# Patient Record
Sex: Male | Born: 1965 | Race: White | Hispanic: Yes | Marital: Married | Smoking: Never smoker
Health system: Southern US, Community
[De-identification: ages and names within clinical notes are randomized; demographics above are authoritative.]

## PROBLEM LIST (undated history)

## (undated) ENCOUNTER — Emergency Department (HOSPITAL_COMMUNITY): Payer: No Typology Code available for payment source | Source: Home / Self Care

## (undated) DIAGNOSIS — I10 Essential (primary) hypertension: Secondary | ICD-10-CM

## (undated) DIAGNOSIS — E119 Type 2 diabetes mellitus without complications: Secondary | ICD-10-CM

## (undated) NOTE — *Deleted (*Deleted)
[facility] NOTE   MRN : [id number]  Reason for Consult: Non healing left foot Referring Physician: Dr.Pashayan  History of Present Illness: 42 y/o male left foot ulcer and cellulitis.  He has started antibiotics on 10/23/21for MSSA bacteremia.    DM with polyneuropathy, PVD, and HTN.       Current Facility-Administered Medications  Medication Dose Route Frequency Provider Last Rate Last Admin  . 0.9 %  sodium chloride infusion  250 mL Intravenous PRN Briant Cedar, MD      . acetaminophen (TYLENOL) tablet 650 mg  650 mg Oral Q6H PRN Briant Cedar, MD   650 mg at 04/14/20 2044   Or  . acetaminophen (TYLENOL) suppository 650 mg  650 mg Rectal Q6H PRN Briant Cedar, MD      . amLODipine (NORVASC) tablet 10 mg  10 mg Oral Daily Benay Pike, MD   10 mg at 04/15/20 [id number]  . aspirin chewable tablet 81 mg  81 mg Oral Daily McDiarmid, Blane Ohara, MD   81 mg at 04/15/20 1222  . [START ON 04/16/2020] atorvastatin (LIPITOR) tablet 40 mg  40 mg Oral Daily McDiarmid, Blane Ohara, MD      . baclofen (LIORESAL) tablet 5 mg  5 mg Oral TID PRN Darrelyn Hillock N, DO   5 mg at 04/14/20 1116  . ceFAZolin (ANCEF) IVPB 2g/100 mL premix  2 g Intravenous Q8H Briant Cedar, MD 200 mL/hr at 04/15/20 0838 2 g at 04/15/20 0838  . enoxaparin (LOVENOX) injection 40 mg  40 mg Subcutaneous Q24H Briant Cedar, MD   40 mg at 04/14/20 1737  . ferrous sulfate tablet 325 mg  325 mg Oral Daily Ganta, Anupa, DO   325 mg at 04/15/20 0837  . [START ON 04/16/2020] glipiZIDE (GLUCOTROL XL) 24 hr tablet 5 mg  5 mg Oral Q breakfast McDiarmid, Blane Ohara, MD      . insulin aspart (novoLOG) injection 0-5 Units  0-5 Units Subcutaneous QHS Pashayan, Redgie Grayer, MD      . insulin aspart (novoLOG) injection 0-9 Units  0-9 Units Subcutaneous TID WC Briant Cedar, MD   3 Units at 04/15/20 1222  . polyethylene glycol (MIRALAX / GLYCOLAX) packet 17 g  17 g Oral  Daily PRN Briant Cedar, MD      . sodium chloride flush (NS) 0.9 % injection 3 mL  3 mL Intravenous Q12H Briant Cedar, MD   3 mL at 04/14/20 1117  . sodium chloride flush (NS) 0.9 % injection 3 mL  3 mL Intravenous PRN Briant Cedar, MD   3 mL at 04/13/20 2057  . vitamin B-12 (CYANOCOBALAMIN) tablet 1,000 mcg  1,000 mcg Oral Daily Ganta, Anupa, DO   1,000 mcg at 04/15/20 0837    Pt meds include: Statin :No Betablocker: Yes ASA: Yes Other anticoagulants/antiplatelets: None  Past Medical History:  Diagnosis Date  . Cellulitis of foot, left 04/13/2020  . Diabetes mellitus without complication ([place])   . Hypertension   . Traumatic rupture of plantar fascia of left foot 04/14/2020    History reviewed. No pertinent surgical history.  Social History Social History   Tobacco Use  . Smoking status: Never Smoker  . Smokeless tobacco: Never Used  Substance Use Topics  . Alcohol use: Yes    Comment: occasionally  . Drug use: No    Family History History reviewed. No pertinent family history.***  No Known Allergies  REVIEW OF SYSTEMS  General: [ ]  Weight loss, [ ]  Fever, [ ]  chills Neurologic: [ ]  Dizziness, [ ]  Blackouts, [ ]  Seizure [ ]  Stroke, [ ]  "Mini stroke", [ ]  Slurred speech, [ ]  Temporary blindness; [ ]  weakness in arms or legs, [ ]  Hoarseness [ ]  Dysphagia Cardiac: [ ]  Chest pain/pressure, [ ]  Shortness of breath at rest [ ]  Shortness of breath with exertion, [ ]  Atrial fibrillation or irregular heartbeat  Vascular: [ ]  Pain in legs with walking, [ ]  Pain in legs at rest, [ ]  Pain in legs at night,  [x ] Non-healing ulcer, [ ]  Blood clot in vein/DVT,   Pulmonary: [ ]  Home oxygen, [ ]  Productive cough, [ ]  Coughing up blood, [ ]  Asthma,  [ ]  Wheezing [ ]  COPD Musculoskeletal:  [ ]  Arthritis, [ ]  Low back pain, [ ]  Joint pain Hematologic: [ ]  Easy Bruising, [ ]  Anemia; [ ]  Hepatitis Gastrointestinal: [ ]  Blood in stool, [ ]  Gastroesophageal  Reflux/heartburn, Urinary: [ ]  chronic Kidney disease, [ ]  on HD - [ ]  MWF or [ ]  TTHS, [ ]  Burning with urination, [ ]  Difficulty urinating Skin: [ ]  Rashes, [x ] Wounds Psychological: [ ]  Anxiety, [ ]  Depression  Physical Examination Vitals:   04/15/20 0436 04/15/20 0800 04/15/20 0823 04/15/20 1140  BP: (!) 142/77  (!) 150/81 (!) 143/79  Pulse: 88  89 88  Resp:  18 18 18   Temp: 98.4 F (36.9 C)  99.1 F (37.3 C) 99 F (37.2 C)  TempSrc: Oral  Oral Oral  SpO2: 99% 96% 95% 100%  Weight:      Height:       Body mass index is 40.24 kg/m.  General:  WDWN in NAD Gait: Normal HENT: WNL Eyes: Pupils equal Pulmonary: normal non-labored breathing , without Rales, rhonchi,  wheezing Cardiac: RRR, without  Murmurs, rubs or gallops; No carotid bruits Abdomen: soft, NT, no masses Skin: no rashes, ulcers noted;  no Gangrene , no cellulitis; no open wounds;   Vascular Exam/Pulses:***   Musculoskeletal: no muscle wasting or atrophy; no edema  Neurologic: A&O X 3; Appropriate Affect ;  SENSATION: normal; MOTOR FUNCTION: 5/5 Symmetric Speech is fluent/normal   Significant Diagnostic Studies: CBC Lab Results  Component Value Date   WBC 18.2 (H) 04/15/2020   HGB 7.9 (L) 04/15/2020   HCT 23.7 (L) 04/15/2020   MCV 88.4 04/15/2020   PLT 555 (H) 04/15/2020    BMET    Component Value Date/Time   NA 136 04/14/2020 0624   K 3.9 04/14/2020 0624   CL 106 04/14/2020 0624   CO2 22 04/14/2020 0624   GLUCOSE 107 (H) 04/14/2020 0624   BUN 16 04/14/2020 0624   CREATININE 0.95 04/14/2020 0624   CALCIUM 8.3 (L) 04/14/2020 0624   GFRNONAA >60 04/14/2020 0624   GFRAA  09/21/2009 0020    >60        The eGFR has been calculated using the MDRD equation. This calculation has not been validated in all clinical situations. eGFR's persistently <60 mL/min signify possible Chronic Kidney Disease.   Estimated Creatinine Clearance: 90.3 mL/min (by C-G formula based on SCr of 0.95 mg/dL).   COAG No results found for: INR, PROTIME   Non-Invasive Vascular Imaging:  Review of the MRI scan shows edema over the lateral foot there is some superficial reactive edema in the bone but no evidence of chronic osteomyelitis.  No purulent abscess.    ABI  Findings:  +---------+------------------+-----+---------+--------+  Right  Rt Pressure (mmHg)IndexWaveform Comment   +---------+------------------+-----+---------+--------+  Brachial 168           triphasic      +---------+------------------+-----+---------+--------+  PTA   255        1.52 triphasic      +---------+------------------+-----+---------+--------+  DP    255        1.52 triphasic      +---------+------------------+-----+---------+--------+  Great Toe101        0.60            +---------+------------------+-----+---------+--------+   +---------+------------------+-----+---------+-------+  Left   Lt Pressure (mmHg)IndexWaveform Comment  +---------+------------------+-----+---------+-------+  Brachial 167           triphasic      +---------+------------------+-----+---------+-------+  PTA   255        1.52 triphasic      +---------+------------------+-----+---------+-------+  DP    255        1.52 triphasic      +---------+------------------+-----+---------+-------+  Great Toe52        0.31            +---------+------------------+-----+---------+-------+   +-------+-----------+-----------+------------+------------+  ABI/TBIToday's ABIToday's TBIPrevious ABIPrevious TBI  +-------+-----------+-----------+------------+------------+  Right 1.52    0.60                  +-------+-----------+-----------+------------+------------+  Left  1.52    0.31                   +-------+-----------+-----------+------------+------------+       Summary:  Right: Resting right ankle-brachial index indicates noncompressible right  lower extremity arteries.   Left: Resting left ankle-brachial index indicates noncompressible left  lower extremity arteries.      +-----+---------------+---------+-----------+----------+--------------+  RIGHTCompressibilityPhasicitySpontaneityPropertiesThrombus Aging  +-----+---------------+---------+-----------+----------+--------------+  CFV Full      Yes   Yes                  +-----+---------------+---------+-----------+----------+--------------+         +---------+---------------+---------+-----------+----------+--------------+   LEFT   CompressibilityPhasicitySpontaneityPropertiesThrombus  Aging  +---------+---------------+---------+-----------+----------+--------------+   CFV   Full      Yes   Yes                    +---------+---------------+---------+-----------+----------+--------------+   SFJ   Full                                 +---------+---------------+---------+-----------+----------+--------------+   FV Prox Full                                 +---------+---------------+---------+-----------+----------+--------------+   FV Mid  Full                                 +---------+---------------+---------+-----------+----------+--------------+   FV DistalFull                                 +---------+---------------+---------+-----------+----------+--------------+   PFV   Full                                 +---------+---------------+---------+-----------+----------+--------------+   POP   Full      Yes   Yes                     +---------+---------------+---------+-----------+----------+--------------+  PTV   Full                                 +---------+---------------+---------+-----------+----------+--------------+   PERO                           Not  visualized  +---------+---------------+---------+-----------+----------+--------------+     Summary:  RIGHT:  - No evidence of common femoral vein obstruction.    LEFT:  - There is no evidence of deep vein thrombosis in the lower extremity.     ASSESSMENT/PLAN: ***   Roxy Horseman 04/15/2020 2:55 PM

---

## 1997-12-10 ENCOUNTER — Emergency Department (HOSPITAL_COMMUNITY): Admission: EM | Admit: 1997-12-10 | Discharge: 1997-12-10 | Payer: Self-pay | Admitting: Emergency Medicine

## 2003-07-10 ENCOUNTER — Encounter: Admission: RE | Admit: 2003-07-10 | Discharge: 2003-10-08 | Payer: Self-pay | Admitting: Family Medicine

## 2006-05-25 ENCOUNTER — Inpatient Hospital Stay (HOSPITAL_COMMUNITY): Admission: EM | Admit: 2006-05-25 | Discharge: 2006-05-27 | Payer: Self-pay | Admitting: Emergency Medicine

## 2008-04-30 ENCOUNTER — Emergency Department (HOSPITAL_COMMUNITY): Admission: EM | Admit: 2008-04-30 | Discharge: 2008-04-30 | Payer: Self-pay | Admitting: Emergency Medicine

## 2009-09-20 ENCOUNTER — Emergency Department (HOSPITAL_COMMUNITY)
Admission: EM | Admit: 2009-09-20 | Discharge: 2009-09-21 | Payer: Self-pay | Source: Home / Self Care | Admitting: Emergency Medicine

## 2010-09-10 LAB — DIFFERENTIAL
Basophils Relative: 0 % (ref 0–1)
Eosinophils Relative: 1 % (ref 0–5)
Neutro Abs: 10.1 10*3/uL — ABNORMAL HIGH (ref 1.7–7.7)
Neutrophils Relative %: 73 % (ref 43–77)

## 2010-09-10 LAB — CBC
HCT: 43.1 % (ref 39.0–52.0)
MCHC: 34.6 g/dL (ref 30.0–36.0)
MCV: 89 fL (ref 78.0–100.0)
Platelets: 148 10*3/uL — ABNORMAL LOW (ref 150–400)
RDW: 12.1 % (ref 11.5–15.5)

## 2010-09-10 LAB — URINALYSIS, ROUTINE W REFLEX MICROSCOPIC
Bilirubin Urine: NEGATIVE
Hgb urine dipstick: NEGATIVE
Nitrite: NEGATIVE
pH: 7 (ref 5.0–8.0)

## 2010-09-10 LAB — COMPREHENSIVE METABOLIC PANEL
ALT: 37 U/L (ref 0–53)
AST: 27 U/L (ref 0–37)
Alkaline Phosphatase: 74 U/L (ref 39–117)
BUN: 9 mg/dL (ref 6–23)
Calcium: 8.8 mg/dL (ref 8.4–10.5)
Chloride: 107 mEq/L (ref 96–112)
GFR calc Af Amer: >60 mL/min (ref >60)
Potassium: 3.4 mEq/L — ABNORMAL LOW (ref 3.5–5.1)
Sodium: 138 mEq/L (ref 135–145)
Total Bilirubin: 0.7 mg/dL (ref 0.3–1.2)

## 2010-11-07 NOTE — Discharge Summary (Signed)
Keith Garrett, Keith Garrett NO.:  [id number]   MEDICAL RECORD NO.:  [id number]          PATIENT TYPE:  INP   LOCATION:  2022                         FACILITY:  [facility]   PHYSICIAN:  Hind I Elsaid, MD      DATE OF BIRTH:  1966-03-25   DATE OF ADMISSION:  05/25/2006  DATE OF DISCHARGE:  05/27/2006                               DISCHARGE SUMMARY   PRIMARY CARE PHYSICIAN:  Jama Flavors. Redmond Pulling, M.D.   DISCHARGE DIAGNOSES:  1. Community-acquired pneumonia.  2. Uncontrolled diabetes mellitus with hemoglobin A1c 9.7.  3. Hyperlipidemia.  4. Obesity with metabolic syndrome.   DISCHARGE MEDICATIONS:  1. Avelox 400 mg p.o. daily for 10 days.  2. Glipizide 10 mg p.o. daily.  3. Metformin 500 mg 3 times a day.  4. Lovastatin 400 mg p.o. daily.  5. Aspirin 325 mg p.o. daily.  6. Albuterol nebulizations q.6h. p.r.n.   PROCEDURE DURING HOSPITALIZATION:  Chest x-ray was done during  admission, which revealed a segment of left lower lobe air-space disease  most consistent with pneumonia, lower lung volume with right  interstitial prominence primarily on the right; this is likely due to  chronic bronchitis or smoking.   CONSULTATIONS:  None.   HOSPITAL COURSE:  1. [name] is a 45 year old man, who is Spanish.  He was admitted      with cough and shortness of breath and found to have high white      blood cells, and a chest x-ray with above findings, diagnosed as a      community-acquired pneumonia.  The patient was started on Zithromax      and Rocephin.  A blood culture was obtained which was negative.      Urine for Staphylococcal pneumonia was positive and for Legionella      antigen was negative.  The patient was placed on oxygen.  White      blood cells on admission were 30,000, and on discharge white blood      cells significantly came down to 13.  The patient did not require      any oxygen during hospitalization.  We think patient can be      discharged today with Avelox  400 mg p.o. for 10 days and follow up      with primary care on Monday for further evaluation.  2. Uncontrolled diabetes mellitus.  The patient admitted his      noncompliance with the medication and with the diet, and will      prescribe the patient metformin 500 mg p.o. t.i.d. and will stop      Avandia for the risk of CHF and      congestion.  Continue with glipizide 10 mg  p.o. daily and will      increase metformin to 500 mg b.i.d.  The patient to follow up with      primary care for further adjustment for hemoglobin A1c and for the      diabetes.  3. Dyslipidemia.  The patient to continue Lovastatin and aspirin.      Hind Franco Collet, MD  Electronically Signed     HIE/MEDQ  D:  05/27/2006  T:  05/28/2006  Job:  [id number]

## 2010-11-07 NOTE — H&P (Signed)
Keith Garrett NO.:  [id number]   MEDICAL RECORD NO.:  [id number]          PATIENT TYPE:  EMS   LOCATION:  [facility]                         FACILITY:  [facility]   PHYSICIAN:  Mobolaji B. Bakare, M.D.DATE OF BIRTH:  24-Nov-1965   DATE OF ADMISSION:  05/25/2006  DATE OF DISCHARGE:                              HISTORY & PHYSICAL   PRIMARY CARE PHYSICIAN:  Jama Flavors. Redmond Pulling, M.D.   CHIEF COMPLAINT:  Cough and fever.   HISTORY OF PRESENT ILLNESS:  Keith Garrett is a 45 year old Hispanic  gentleman who was well until 3 days ago while working last Sunday in the  afternoon, he developed chills and fever.  He went home, his wife  checked his temperature and it was 102.  The patient did not use  anything at home.  He subsequently developed cough and shortness of  breath.  Cough is productive of yellowish sputum and it is green in  color.  There is a question of blood streak in the sputum.  There is  associated pleuritic chest pain.  There is no diaphoresis.  The patient  continued to have increased work of breathing  and he was not feeling  better.  He became progressively weak and he has lost his appetite.  Hence, he came to the emergency room.   On initial evaluation, his temperature was 101.8, blood pressure 126/78,  pulse 127, respiratory rate 22, O2 saturations of 91% on room air.  The  patient had a chest x-ray which showed left lower lobe superior segment  air space disease consistent with pneumonia.  He has a leukocytosis of  30,000.  He has received IV Zithromax and Rocephin in the emergency  room.  Blood cultures have been drawn.  He is going to be admitted for  further evaluation.   REVIEW OF SYSTEMS:  He denies abdominal pain, diarrhea, or constipation.  He did have nausea and vomiting daily for the last 3 days.  There is no  dysuria, urgency, or increased frequency of micturition.  He denies  headaches.  Rest of review of systems as in the HPI.   PAST MEDICAL  HISTORY:  1. Diabetes mellitus.  He is on multiple oral hypoglycemic agents.  2. Hyperlipidemia.  3. Obesity.   PAST SURGICAL HISTORY:  None.   MEDICATIONS:  1. Avandia unknown dose.  2. Glipizide 10 mg daily.  3. Metformin 500 mg b.i.d.  4. Lovastatin unknown dose.   ALLERGIES:  No known drug allergies.   FAMILY HISTORY:  Both parents are deceased.  Father died at the age of  43 from heart attack, mother passed away in her [id number] from complications  of diabetes mellitus.  She passed away in 2006/02/21.  The patient's  other siblings are well and alive.   SOCIAL HISTORY:  He works in Engineer, [employer].  He does not smoke  cigarettes currently.  He smoked for 2 years approximately 10 years ago.  He does not drink alcohol.   PHYSICAL EXAMINATION:  VITAL SIGNS:  As mentioned in the HPI.  GENERAL:  The patient is comfortable, slightly tachypneic.  No use of  accessory respiratory muscles.  HEENT:  He is not pale, anicteric.  Mucous membranes moist, no oral  thrush.  NECK:  No elevated JVD.  He has a short neck.  No palpable  lymphadenopathy.  LUNGS:  There is rhonchi breathing in the left lung base and  posteriorly.  HEART:  S1 and S2 tachycardia.  ABDOMEN:  Obese, soft, and nontender.  Bowel sounds present.  No  palpable organomegaly.  No holosystolic murmur.  EXTREMITIES:  No pedal edema, calf tenderness.  Dorsalis pedis pulses  palpable bilaterally.  NEUROLOGY:  No focal neurologic deficit.   INITIAL LABORATORY DATA:  White count 30,400, hemoglobin 14.9,  hematocrit 42.8, platelets 259.  Differential shows left shift.  Urinalysis; specific gravity 1.023, negative for nitrite and esterase,  microscopic is unremarkable.  Strep throat negative.  Sodium 129,  potassium 3.5, chloride 100, BUN 10, creatinine 0.7, glucose 254, bicarb  23.   Chest x-ray shows superior segment left lower lobe air space disease  most consistent with pneumonia.  I recommended radiologic  follow-up  __________ , low lung volumes with mild interstitial prominence  primarily on the right.  This is likely chronic bronchitis of smoking.  Attention to follow-up recommended.   EKG shows sinus tachycardia with a heart rate of 122.   ASSESSMENT/PLAN:  Keith Garrett is a 45 year old Hispanic male presenting  with 3-day history of cough, fever, chills, shortness of breath,  leukocytosis of 30,000, and chest x-ray compatible with left lower lobe  superior segment pneumonia.   1. Community acquired pneumonia with early sepsis.  We will admit him      to stepdown unit.  Continue ceftriaxone 1 gram IV q.12 hours,      Zithromax 500 mg IV daily.  Send sputum for culture and gram stain.      Nebulize with Xopenex 0.6 mg q.3 hours p.r.n.  IV fluids, normal      saline, at 125 mL per hour.  Check lactic acid level, urine      Legionella antigen, urine pneumococcal antigen, blood cultures have      been drawn.  2. Sinus tachycardia secondary to #1.  3. Hyponatremia secondary to nausea and vomiting and hyperglycemia.      We will give IV fluid normal saline as above.  4. Diabetes mellitus uncontrolled.  Holding Metformin secondary to      early sepsis and Avandia in view of recent      literature.  We will start on Lantus 20 units subcutaneous daily      and meal coverage with NovoLog.  Continue Glipizide 10 mg daily.      Check hemoglobin A1C.  5. Hyperlipidemia.  We will continue Lovastatin when dose is      available.  Check fasting lipid profile.      Mobolaji B. Maia Petties, M.D.  Electronically Signed     MBB/MEDQ  D:  05/25/2006  T:  05/25/2006  Job:  [id number]   cc:   Jama Flavors. Redmond Pulling, M.D.

## 2014-02-13 ENCOUNTER — Encounter (HOSPITAL_COMMUNITY): Payer: Self-pay | Admitting: Emergency Medicine

## 2014-02-13 ENCOUNTER — Emergency Department (HOSPITAL_COMMUNITY): Payer: No Typology Code available for payment source

## 2014-02-13 ENCOUNTER — Emergency Department (HOSPITAL_COMMUNITY)
Admission: EM | Admit: 2014-02-13 | Discharge: 2014-02-13 | Disposition: A | Discharge status: Home / Self Care | Payer: No Typology Code available for payment source | Attending: Emergency Medicine | Admitting: Emergency Medicine

## 2014-02-13 DIAGNOSIS — S199XXA Unspecified injury of neck, initial encounter: Principal | ICD-10-CM

## 2014-02-13 DIAGNOSIS — R739 Hyperglycemia, unspecified: Secondary | ICD-10-CM

## 2014-02-13 DIAGNOSIS — S0993XA Unspecified injury of face, initial encounter: Secondary | ICD-10-CM | POA: Diagnosis not present

## 2014-02-13 DIAGNOSIS — Z79899 Other long term (current) drug therapy: Secondary | ICD-10-CM | POA: Insufficient documentation

## 2014-02-13 DIAGNOSIS — R918 Other nonspecific abnormal finding of lung field: Secondary | ICD-10-CM | POA: Diagnosis not present

## 2014-02-13 DIAGNOSIS — R911 Solitary pulmonary nodule: Secondary | ICD-10-CM | POA: Diagnosis not present

## 2014-02-13 DIAGNOSIS — Y9389 Activity, other specified: Secondary | ICD-10-CM | POA: Diagnosis not present

## 2014-02-13 DIAGNOSIS — E119 Type 2 diabetes mellitus without complications: Secondary | ICD-10-CM | POA: Insufficient documentation

## 2014-02-13 DIAGNOSIS — E278 Other specified disorders of adrenal gland: Secondary | ICD-10-CM

## 2014-02-13 DIAGNOSIS — S3981XA Other specified injuries of abdomen, initial encounter: Secondary | ICD-10-CM | POA: Diagnosis not present

## 2014-02-13 DIAGNOSIS — Z7982 Long term (current) use of aspirin: Secondary | ICD-10-CM | POA: Diagnosis not present

## 2014-02-13 DIAGNOSIS — Y92410 Unspecified street and highway as the place of occurrence of the external cause: Secondary | ICD-10-CM | POA: Diagnosis not present

## 2014-02-13 DIAGNOSIS — S298XXA Other specified injuries of thorax, initial encounter: Secondary | ICD-10-CM | POA: Insufficient documentation

## 2014-02-13 DIAGNOSIS — IMO0002 Reserved for concepts with insufficient information to code with codable children: Secondary | ICD-10-CM | POA: Insufficient documentation

## 2014-02-13 DIAGNOSIS — T148XXA Other injury of unspecified body region, initial encounter: Secondary | ICD-10-CM

## 2014-02-13 DIAGNOSIS — I10 Essential (primary) hypertension: Secondary | ICD-10-CM | POA: Insufficient documentation

## 2014-02-13 DIAGNOSIS — E279 Disorder of adrenal gland, unspecified: Secondary | ICD-10-CM

## 2014-02-13 HISTORY — DX: Essential (primary) hypertension: I10

## 2014-02-13 HISTORY — DX: Type 2 diabetes mellitus without complications: E11.9

## 2014-02-13 LAB — I-STAT CHEM 8, ED
BUN: 10 mg/dL (ref 6–23)
CALCIUM ION: 1.18 mmol/L (ref 1.12–1.23)
Chloride: 97 mEq/L (ref 96–112)
Creatinine, Ser: 0.5 mg/dL (ref 0.50–1.35)
Glucose, Bld: 331 mg/dL — ABNORMAL HIGH (ref 70–99)
HEMATOCRIT: 50 % (ref 39.0–52.0)
Hemoglobin: 17 g/dL (ref 13.0–17.0)
POTASSIUM: 4.3 meq/L (ref 3.7–5.3)
Sodium: 131 mEq/L — ABNORMAL LOW (ref 137–147)
TCO2: 22 mmol/L (ref 0–100)

## 2014-02-13 MED ORDER — NAPROXEN 500 MG PO TABS: 500.0000 mg | ORAL_TABLET | Freq: Two times a day (BID) | ORAL | Status: DC

## 2014-02-13 MED ORDER — HYDROCODONE-ACETAMINOPHEN 5-325 MG PO TABS: 1.0000 | ORAL_TABLET | ORAL | Status: DC | PRN

## 2014-02-13 MED ORDER — IOHEXOL 300 MG/ML  SOLN
100.0000 mL | Freq: Once | INTRAMUSCULAR | Status: AC | PRN
  Administered 2014-02-13: 100 mL via INTRAVENOUS
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
## 2014-02-13 NOTE — ED Notes (Signed)
To ED via [place] from [facility] at [facility] and [place]. c-collar intact. Had chest pain on scene--radiating to left side of neck. Pain on palpation over sternum. Alert/Oriented x 3, w/d

## 2014-02-13 NOTE — Discharge Instructions (Signed)

## 2019-10-10 DIAGNOSIS — E11319 Type 2 diabetes mellitus with unspecified diabetic retinopathy without macular edema: Secondary | ICD-10-CM | POA: Insufficient documentation

## 2019-10-10 DIAGNOSIS — G459 Transient cerebral ischemic attack, unspecified: Secondary | ICD-10-CM | POA: Insufficient documentation

## 2019-10-10 DIAGNOSIS — H269 Unspecified cataract: Secondary | ICD-10-CM | POA: Insufficient documentation

## 2020-04-08 ENCOUNTER — Emergency Department (HOSPITAL_COMMUNITY): Payer: No Typology Code available for payment source

## 2020-04-08 ENCOUNTER — Emergency Department (HOSPITAL_COMMUNITY)
Admission: EM | Admit: 2020-04-08 | Discharge: 2020-04-09 | Disposition: A | Discharge status: Home / Self Care | Payer: No Typology Code available for payment source | Attending: Emergency Medicine | Admitting: Emergency Medicine

## 2020-04-08 ENCOUNTER — Encounter (HOSPITAL_COMMUNITY): Payer: Self-pay

## 2020-04-08 DIAGNOSIS — Z7982 Long term (current) use of aspirin: Secondary | ICD-10-CM | POA: Diagnosis not present

## 2020-04-08 DIAGNOSIS — M549 Dorsalgia, unspecified: Secondary | ICD-10-CM | POA: Diagnosis present

## 2020-04-08 DIAGNOSIS — Z7984 Long term (current) use of oral hypoglycemic drugs: Secondary | ICD-10-CM | POA: Insufficient documentation

## 2020-04-08 DIAGNOSIS — M7918 Myalgia, other site: Secondary | ICD-10-CM | POA: Insufficient documentation

## 2020-04-08 DIAGNOSIS — E119 Type 2 diabetes mellitus without complications: Secondary | ICD-10-CM | POA: Insufficient documentation

## 2020-04-08 DIAGNOSIS — I10 Essential (primary) hypertension: Secondary | ICD-10-CM | POA: Diagnosis not present

## 2020-04-08 DIAGNOSIS — Z79899 Other long term (current) drug therapy: Secondary | ICD-10-CM | POA: Diagnosis not present

## 2020-04-08 DIAGNOSIS — M47816 Spondylosis without myelopathy or radiculopathy, lumbar region: Secondary | ICD-10-CM | POA: Diagnosis present

## 2020-04-08 MED ORDER — ONDANSETRON 4 MG PO TBDP
4.0000 mg | ORAL_TABLET | Freq: Once | ORAL | Status: AC
  Administered 2020-04-08: 4 mg via ORAL
  Filled 2020-04-08: qty 1

## 2020-04-08 NOTE — ED Notes (Signed)
Pt also reports nausea that started friday
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
## 2020-04-09 ENCOUNTER — Other Ambulatory Visit: Payer: Self-pay

## 2020-04-09 MED ORDER — METHOCARBAMOL 500 MG PO TABS: 500.0000 mg | ORAL_TABLET | Freq: Two times a day (BID) | ORAL | 0 refills | Status: DC

## 2020-04-09 MED ORDER — NAPROXEN 250 MG PO TABS
375.0000 mg | ORAL_TABLET | Freq: Once | ORAL | Status: AC
  Administered 2020-04-09: 375 mg via ORAL
  Filled 2020-04-09: qty 2

## 2020-04-09 MED ORDER — NAPROXEN 375 MG PO TABS: 375.0000 mg | ORAL_TABLET | Freq: Two times a day (BID) | ORAL | 0 refills | Status: DC

## 2020-04-09 NOTE — ED Notes (Signed)
Discharge instructions discussed with pt.Discussed medications and reinforced no additional nsaids.  Pt verbalized understanding with no questions at this time.
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
## 2020-04-13 ENCOUNTER — Other Ambulatory Visit: Payer: Self-pay

## 2020-04-13 ENCOUNTER — Emergency Department (HOSPITAL_COMMUNITY): Payer: No Typology Code available for payment source

## 2020-04-13 ENCOUNTER — Inpatient Hospital Stay (HOSPITAL_COMMUNITY)
Admission: EM | Admit: 2020-04-13 | Discharge: 2020-04-19 | DRG: 602 | Disposition: A | Discharge status: Home Health | Payer: No Typology Code available for payment source | Attending: Family Medicine | Admitting: Family Medicine

## 2020-04-13 ENCOUNTER — Encounter (HOSPITAL_COMMUNITY): Payer: Self-pay | Admitting: Emergency Medicine

## 2020-04-13 DIAGNOSIS — I739 Peripheral vascular disease, unspecified: Secondary | ICD-10-CM | POA: Diagnosis present

## 2020-04-13 DIAGNOSIS — E1139 Type 2 diabetes mellitus with other diabetic ophthalmic complication: Secondary | ICD-10-CM

## 2020-04-13 DIAGNOSIS — I10 Essential (primary) hypertension: Secondary | ICD-10-CM | POA: Diagnosis present

## 2020-04-13 DIAGNOSIS — D75838 Other thrombocytosis: Secondary | ICD-10-CM | POA: Diagnosis present

## 2020-04-13 DIAGNOSIS — L97521 Non-pressure chronic ulcer of other part of left foot limited to breakdown of skin: Secondary | ICD-10-CM | POA: Diagnosis present

## 2020-04-13 DIAGNOSIS — M5136 Other intervertebral disc degeneration, lumbar region: Secondary | ICD-10-CM | POA: Diagnosis present

## 2020-04-13 DIAGNOSIS — Z7984 Long term (current) use of oral hypoglycemic drugs: Secondary | ICD-10-CM | POA: Diagnosis not present

## 2020-04-13 DIAGNOSIS — L02619 Cutaneous abscess of unspecified foot: Secondary | ICD-10-CM

## 2020-04-13 DIAGNOSIS — M609 Myositis, unspecified: Secondary | ICD-10-CM | POA: Diagnosis present

## 2020-04-13 DIAGNOSIS — M86171 Other acute osteomyelitis, right ankle and foot: Secondary | ICD-10-CM | POA: Diagnosis present

## 2020-04-13 DIAGNOSIS — Z7982 Long term (current) use of aspirin: Secondary | ICD-10-CM

## 2020-04-13 DIAGNOSIS — B9561 Methicillin susceptible Staphylococcus aureus infection as the cause of diseases classified elsewhere: Secondary | ICD-10-CM | POA: Diagnosis present

## 2020-04-13 DIAGNOSIS — E11621 Type 2 diabetes mellitus with foot ulcer: Secondary | ICD-10-CM | POA: Diagnosis present

## 2020-04-13 DIAGNOSIS — M65979 Unspecified synovitis and tenosynovitis, unspecified ankle and foot: Secondary | ICD-10-CM | POA: Diagnosis present

## 2020-04-13 DIAGNOSIS — R7881 Bacteremia: Secondary | ICD-10-CM | POA: Diagnosis present

## 2020-04-13 DIAGNOSIS — Z20822 Contact with and (suspected) exposure to covid-19: Secondary | ICD-10-CM | POA: Diagnosis present

## 2020-04-13 DIAGNOSIS — L03116 Cellulitis of left lower limb: Secondary | ICD-10-CM | POA: Diagnosis present

## 2020-04-13 DIAGNOSIS — D649 Anemia, unspecified: Secondary | ICD-10-CM | POA: Diagnosis present

## 2020-04-13 DIAGNOSIS — E43 Unspecified severe protein-calorie malnutrition: Secondary | ICD-10-CM | POA: Diagnosis present

## 2020-04-13 DIAGNOSIS — M545 Low back pain, unspecified: Secondary | ICD-10-CM | POA: Diagnosis present

## 2020-04-13 DIAGNOSIS — L0390 Cellulitis, unspecified: Secondary | ICD-10-CM

## 2020-04-13 DIAGNOSIS — L02612 Cutaneous abscess of left foot: Secondary | ICD-10-CM | POA: Diagnosis present

## 2020-04-13 DIAGNOSIS — D72829 Elevated white blood cell count, unspecified: Secondary | ICD-10-CM

## 2020-04-13 DIAGNOSIS — I7090 Unspecified atherosclerosis: Secondary | ICD-10-CM | POA: Diagnosis present

## 2020-04-13 DIAGNOSIS — E669 Obesity, unspecified: Secondary | ICD-10-CM | POA: Diagnosis present

## 2020-04-13 DIAGNOSIS — R937 Abnormal findings on diagnostic imaging of other parts of musculoskeletal system: Secondary | ICD-10-CM | POA: Diagnosis present

## 2020-04-13 DIAGNOSIS — M86172 Other acute osteomyelitis, left ankle and foot: Secondary | ICD-10-CM | POA: Diagnosis present

## 2020-04-13 DIAGNOSIS — Z833 Family history of diabetes mellitus: Secondary | ICD-10-CM | POA: Diagnosis not present

## 2020-04-13 DIAGNOSIS — M47816 Spondylosis without myelopathy or radiculopathy, lumbar region: Secondary | ICD-10-CM | POA: Diagnosis present

## 2020-04-13 DIAGNOSIS — M659 Synovitis and tenosynovitis, unspecified: Secondary | ICD-10-CM | POA: Diagnosis present

## 2020-04-13 DIAGNOSIS — E8809 Other disorders of plasma-protein metabolism, not elsewhere classified: Secondary | ICD-10-CM | POA: Diagnosis present

## 2020-04-13 DIAGNOSIS — Z79899 Other long term (current) drug therapy: Secondary | ICD-10-CM

## 2020-04-13 DIAGNOSIS — G8929 Other chronic pain: Secondary | ICD-10-CM | POA: Diagnosis present

## 2020-04-13 DIAGNOSIS — E1169 Type 2 diabetes mellitus with other specified complication: Secondary | ICD-10-CM | POA: Diagnosis present

## 2020-04-13 DIAGNOSIS — Z791 Long term (current) use of non-steroidal anti-inflammatories (NSAID): Secondary | ICD-10-CM

## 2020-04-13 DIAGNOSIS — L97509 Non-pressure chronic ulcer of other part of unspecified foot with unspecified severity: Secondary | ICD-10-CM

## 2020-04-13 DIAGNOSIS — Z6841 Body Mass Index (BMI) 40.0 and over, adult: Secondary | ICD-10-CM

## 2020-04-13 DIAGNOSIS — E1151 Type 2 diabetes mellitus with diabetic peripheral angiopathy without gangrene: Secondary | ICD-10-CM | POA: Diagnosis present

## 2020-04-13 DIAGNOSIS — E11319 Type 2 diabetes mellitus with unspecified diabetic retinopathy without macular edema: Secondary | ICD-10-CM

## 2020-04-13 DIAGNOSIS — E1142 Type 2 diabetes mellitus with diabetic polyneuropathy: Secondary | ICD-10-CM | POA: Diagnosis present

## 2020-04-13 DIAGNOSIS — R809 Proteinuria, unspecified: Secondary | ICD-10-CM | POA: Diagnosis present

## 2020-04-13 DIAGNOSIS — S93692A Other sprain of left foot, initial encounter: Secondary | ICD-10-CM | POA: Diagnosis present

## 2020-04-13 DIAGNOSIS — M79672 Pain in left foot: Secondary | ICD-10-CM | POA: Diagnosis present

## 2020-04-13 HISTORY — DX: Cellulitis of left lower limb: L03.116

## 2020-04-13 LAB — GLUCOSE, CAPILLARY: Glucose-Capillary: 157 mg/dL — ABNORMAL HIGH (ref 70–99)

## 2020-04-13 LAB — CBC WITH DIFFERENTIAL/PLATELET
Abs Immature Granulocytes: 0.18 10*3/uL — ABNORMAL HIGH (ref 0.00–0.07)
Basophils Absolute: 0.1 10*3/uL (ref 0.0–0.1)
Basophils Relative: 0 %
Eosinophils Absolute: 0.3 10*3/uL (ref 0.0–0.5)
Eosinophils Relative: 1 %
HCT: 26.5 % — ABNORMAL LOW (ref 39.0–52.0)
Hemoglobin: 8.5 g/dL — ABNORMAL LOW (ref 13.0–17.0)
Immature Granulocytes: 1 %
Lymphocytes Relative: 5 %
Lymphs Abs: 1.3 10*3/uL (ref 0.7–4.0)
MCH: 29.3 pg (ref 26.0–34.0)
MCHC: 32.1 g/dL (ref 30.0–36.0)
MCV: 91.4 fL (ref 80.0–100.0)
Monocytes Absolute: 1.7 10*3/uL — ABNORMAL HIGH (ref 0.1–1.0)
Monocytes Relative: 7 %
Neutro Abs: 21.2 10*3/uL — ABNORMAL HIGH (ref 1.7–7.7)
Neutrophils Relative %: 86 %
Platelets: 529 10*3/uL — ABNORMAL HIGH (ref 150–400)
RBC: 2.9 MIL/uL — ABNORMAL LOW (ref 4.22–5.81)
RDW: 13.2 % (ref 11.5–15.5)
WBC: 24.7 10*3/uL — ABNORMAL HIGH (ref 4.0–10.5)
nRBC: 0 % (ref 0.0–0.2)

## 2020-04-13 LAB — BASIC METABOLIC PANEL
Anion gap: 10 (ref 5–15)
BUN: 17 mg/dL (ref 6–20)
CO2: 21 mmol/L — ABNORMAL LOW (ref 22–32)
Calcium: 8.1 mg/dL — ABNORMAL LOW (ref 8.9–10.3)
Chloride: 102 mmol/L (ref 98–111)
Creatinine, Ser: 1.04 mg/dL (ref 0.61–1.24)
GFR, Estimated: >60 mL/min (ref >60)
Glucose, Bld: 82 mg/dL (ref 70–99)
Potassium: 3.8 mmol/L (ref 3.5–5.1)
Sodium: 133 mmol/L — ABNORMAL LOW (ref 135–145)

## 2020-04-13 LAB — CBG MONITORING, ED
Glucose-Capillary: 86 mg/dL (ref 70–99)
Glucose-Capillary: 84 mg/dL (ref 70–99)

## 2020-04-13 LAB — RESPIRATORY PANEL BY RT PCR (FLU A&B, COVID)
Influenza A by PCR: NEGATIVE
Influenza B by PCR: NEGATIVE
SARS Coronavirus 2 by RT PCR: NEGATIVE

## 2020-04-13 MED ORDER — SODIUM CHLORIDE 0.9 % IV SOLN
1.0000 g | INTRAVENOUS | Status: DC
  Administered 2020-04-14: 1 g via INTRAVENOUS
  Filled 2020-04-13: qty 10
  Filled 2020-04-13: qty 1

## 2020-04-13 MED ORDER — POLYETHYLENE GLYCOL 3350 17 G PO PACK: 17.0000 g | PACK | Freq: Every day | ORAL | Status: DC | PRN

## 2020-04-13 MED ORDER — ACETAMINOPHEN 325 MG PO TABS
650.0000 mg | ORAL_TABLET | Freq: Four times a day (QID) | ORAL | Status: DC | PRN
  Administered 2020-04-13 – 2020-04-16 (×4): 650 mg via ORAL
  Filled 2020-04-13 (×4): qty 2

## 2020-04-13 MED ORDER — INSULIN ASPART 100 UNIT/ML ~~LOC~~ SOLN
0.0000 [IU] | Freq: Three times a day (TID) | SUBCUTANEOUS | Status: DC
  Administered 2020-04-14: 3 [IU] via SUBCUTANEOUS
  Administered 2020-04-14: 1 [IU] via SUBCUTANEOUS
  Administered 2020-04-15: 3 [IU] via SUBCUTANEOUS
  Administered 2020-04-15: 2 [IU] via SUBCUTANEOUS
  Administered 2020-04-15: 3 [IU] via SUBCUTANEOUS
  Administered 2020-04-16 – 2020-04-17 (×4): 2 [IU] via SUBCUTANEOUS
  Administered 2020-04-18 (×2): 3 [IU] via SUBCUTANEOUS
  Administered 2020-04-18: 2 [IU] via SUBCUTANEOUS
  Administered 2020-04-19: 5 [IU] via SUBCUTANEOUS
  Administered 2020-04-19: 3 [IU] via SUBCUTANEOUS

## 2020-04-13 MED ORDER — SODIUM CHLORIDE 0.9 % IV SOLN: 250.0000 mL | INTRAVENOUS | Status: DC | PRN

## 2020-04-13 MED ORDER — VANCOMYCIN HCL 1500 MG/300ML IV SOLN
1500.0000 mg | INTRAVENOUS | Status: DC
  Filled 2020-04-13: qty 300

## 2020-04-13 MED ORDER — SODIUM CHLORIDE 0.9% FLUSH
3.0000 mL | Freq: Two times a day (BID) | INTRAVENOUS | Status: DC
  Administered 2020-04-14 – 2020-04-19 (×7): 3 mL via INTRAVENOUS

## 2020-04-13 MED ORDER — LIDOCAINE HCL (PF) 1 % IJ SOLN
20.0000 mL | Freq: Once | INTRAMUSCULAR | Status: AC
  Administered 2020-04-13: 20 mL
  Filled 2020-04-13: qty 20

## 2020-04-13 MED ORDER — INSULIN ASPART 100 UNIT/ML ~~LOC~~ SOLN
0.0000 [IU] | Freq: Every day | SUBCUTANEOUS | Status: DC
  Administered 2020-04-15: 3 [IU] via SUBCUTANEOUS

## 2020-04-13 MED ORDER — AMLODIPINE BESYLATE 10 MG PO TABS
10.0000 mg | ORAL_TABLET | Freq: Every day | ORAL | Status: DC
  Administered 2020-04-14 – 2020-04-19 (×6): 10 mg via ORAL
  Filled 2020-04-13 (×6): qty 1

## 2020-04-13 MED ORDER — VANCOMYCIN HCL 2000 MG/400ML IV SOLN
2000.0000 mg | Freq: Once | INTRAVENOUS | Status: AC
  Administered 2020-04-13: 2000 mg via INTRAVENOUS
  Filled 2020-04-13: qty 400

## 2020-04-13 MED ORDER — SODIUM CHLORIDE 0.9% FLUSH
3.0000 mL | INTRAVENOUS | Status: DC | PRN
  Administered 2020-04-13: 3 mL via INTRAVENOUS

## 2020-04-13 MED ORDER — ACETAMINOPHEN 650 MG RE SUPP: 650.0000 mg | Freq: Four times a day (QID) | RECTAL | Status: DC | PRN

## 2020-04-13 MED ORDER — ENOXAPARIN SODIUM 40 MG/0.4ML ~~LOC~~ SOLN
40.0000 mg | SUBCUTANEOUS | Status: DC
  Administered 2020-04-13 – 2020-04-18 (×6): 40 mg via SUBCUTANEOUS
  Filled 2020-04-13 (×6): qty 0.4

## 2020-04-13 MED ORDER — SODIUM CHLORIDE 0.9 % IV SOLN
1.0000 g | Freq: Once | INTRAVENOUS | Status: AC
  Administered 2020-04-13: 1 g via INTRAVENOUS
  Filled 2020-04-13: qty 10

## 2020-04-13 MED ORDER — GADOBUTROL 1 MMOL/ML IV SOLN
10.0000 mL | Freq: Once | INTRAVENOUS | Status: AC | PRN
  Administered 2020-04-13: 10 mL via INTRAVENOUS

## 2020-04-13 NOTE — ED Notes (Signed)
Provided patient with food to support blood glucose. Pt resting in bed. NADN.

## 2020-04-13 NOTE — Progress Notes (Signed)
Pharmacy Antibiotic Note  Keith Garrett is a 54 y.o. male admitted on 04/13/2020 with cellulitis.  Pharmacy has been consulted for Vancomycin dosing.   Height: 5\' 2"  (157.5 cm) Weight: 99.8 kg (220 lb) IBW/kg (Calculated) : 54.6  Temp (24hrs), Avg:98 F (36.7 C), Min:98 F (36.7 C), Max:98 F (36.7 C)  Recent Labs  Lab 04/13/20 1236  WBC 24.7*  CREATININE 1.04    Estimated Creatinine Clearance: 82.5 mL/min (by C-G formula based on SCr of 1.04 mg/dL).    No Known Allergies  Antimicrobials this admission: 10/23 Ceftroaxone >>  10/23 Vancomycin >>   Dose adjustments this admission: N/a  Microbiology results: Pending   Plan:  - Vancomycin 2000mg  IV x 1 dose  - Followed by Vancomycin 1500mg  IV q24h - Est Calc AUC 538 - Monitor patients renal function and urine output  - De-escalate ABX when appropriate   Thank you for allowing pharmacy to be a part of this patient's care.  Duanne Limerick PharmD. BCPS 04/13/2020 2:48 PM

## 2020-04-13 NOTE — ED Notes (Signed)
Bed Bug discovered on patient. Patient moved to decon for shower. Belongings into bags.
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
## 2020-04-13 NOTE — ED Notes (Signed)
Pt returned to ED

## 2020-04-13 NOTE — Progress Notes (Addendum)
Pt admitted to [place] from [facility] ED.  Pt A&O X 4 and neuro intact..  Pt vitals taken and all within normal range, with exception of low grade fever of 100.9.  CHG bath completed.  Pt placed on telemetry and CCMD notified.  Pt currently resting comfortably.

## 2020-04-13 NOTE — Progress Notes (Signed)
Orthopedic Tech Progress Note Patient Details:  Halsey Hammen Oct 02, 1965 [id number]  Ortho Devices Type of Ortho Device: Post (short leg) splint Ortho Device/Splint Location: lle. I applied splint with dressing on wound at dr brooks request. Ortho Device/Splint Interventions: Ordered, Application, Adjustment   Post Interventions Patient Tolerated: Well Instructions Provided: Care of device, Adjustment of device   Karolee Stamps 04/13/2020, 9:47 PM
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
## 2020-04-13 NOTE — ED Notes (Signed)
Pt to MRI
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
## 2020-04-14 ENCOUNTER — Encounter (HOSPITAL_COMMUNITY): Payer: Self-pay

## 2020-04-14 ENCOUNTER — Encounter (HOSPITAL_COMMUNITY): Payer: Self-pay | Admitting: Student in an Organized Health Care Education/Training Program

## 2020-04-14 DIAGNOSIS — E1139 Type 2 diabetes mellitus with other diabetic ophthalmic complication: Secondary | ICD-10-CM

## 2020-04-14 DIAGNOSIS — L02619 Cutaneous abscess of unspecified foot: Secondary | ICD-10-CM

## 2020-04-14 DIAGNOSIS — R937 Abnormal findings on diagnostic imaging of other parts of musculoskeletal system: Secondary | ICD-10-CM | POA: Insufficient documentation

## 2020-04-14 DIAGNOSIS — L02612 Cutaneous abscess of left foot: Secondary | ICD-10-CM | POA: Diagnosis present

## 2020-04-14 DIAGNOSIS — I7090 Unspecified atherosclerosis: Secondary | ICD-10-CM | POA: Insufficient documentation

## 2020-04-14 DIAGNOSIS — D649 Anemia, unspecified: Secondary | ICD-10-CM | POA: Diagnosis present

## 2020-04-14 DIAGNOSIS — L97521 Non-pressure chronic ulcer of other part of left foot limited to breakdown of skin: Secondary | ICD-10-CM | POA: Diagnosis present

## 2020-04-14 DIAGNOSIS — M609 Myositis, unspecified: Secondary | ICD-10-CM | POA: Diagnosis present

## 2020-04-14 DIAGNOSIS — L0390 Cellulitis, unspecified: Secondary | ICD-10-CM

## 2020-04-14 DIAGNOSIS — S93692A Other sprain of left foot, initial encounter: Secondary | ICD-10-CM

## 2020-04-14 DIAGNOSIS — E8809 Other disorders of plasma-protein metabolism, not elsewhere classified: Secondary | ICD-10-CM | POA: Diagnosis present

## 2020-04-14 DIAGNOSIS — M659 Synovitis and tenosynovitis, unspecified: Secondary | ICD-10-CM | POA: Diagnosis present

## 2020-04-14 DIAGNOSIS — D75838 Other thrombocytosis: Secondary | ICD-10-CM | POA: Diagnosis present

## 2020-04-14 HISTORY — DX: Other sprain of left foot, initial encounter: S93.692A

## 2020-04-14 LAB — COMPREHENSIVE METABOLIC PANEL
ALT: 25 U/L (ref 0–44)
AST: 27 U/L (ref 15–41)
Albumin: 1.8 g/dL — ABNORMAL LOW (ref 3.5–5.0)
Alkaline Phosphatase: 159 U/L — ABNORMAL HIGH (ref 38–126)
Anion gap: 8 (ref 5–15)
BUN: 16 mg/dL (ref 6–20)
CO2: 22 mmol/L (ref 22–32)
Calcium: 8.3 mg/dL — ABNORMAL LOW (ref 8.9–10.3)
Chloride: 106 mmol/L (ref 98–111)
Creatinine, Ser: 0.95 mg/dL (ref 0.61–1.24)
GFR, Estimated: >60 mL/min (ref >60)
Glucose, Bld: 107 mg/dL — ABNORMAL HIGH (ref 70–99)
Potassium: 3.9 mmol/L (ref 3.5–5.1)
Sodium: 136 mmol/L (ref 135–145)
Total Bilirubin: 0.9 mg/dL (ref 0.3–1.2)
Total Protein: 6.7 g/dL (ref 6.5–8.1)

## 2020-04-14 LAB — CBC
HCT: 24.6 % — ABNORMAL LOW (ref 39.0–52.0)
Hemoglobin: 8.2 g/dL — ABNORMAL LOW (ref 13.0–17.0)
MCH: 29.3 pg (ref 26.0–34.0)
MCHC: 33.3 g/dL (ref 30.0–36.0)
MCV: 87.9 fL (ref 80.0–100.0)
Platelets: 564 10*3/uL — ABNORMAL HIGH (ref 150–400)
RBC: 2.8 MIL/uL — ABNORMAL LOW (ref 4.22–5.81)
RDW: 13.2 % (ref 11.5–15.5)
WBC: 19.1 10*3/uL — ABNORMAL HIGH (ref 4.0–10.5)
nRBC: 0 % (ref 0.0–0.2)

## 2020-04-14 LAB — C-REACTIVE PROTEIN: CRP: 21 mg/dL — ABNORMAL HIGH

## 2020-04-14 LAB — GLUCOSE, CAPILLARY
Glucose-Capillary: 109 mg/dL — ABNORMAL HIGH (ref 70–99)
Glucose-Capillary: 129 mg/dL — ABNORMAL HIGH (ref 70–99)

## 2020-04-14 LAB — RETICULOCYTES
Immature Retic Fract: 17.2 % — ABNORMAL HIGH (ref 2.3–15.9)
RBC.: 2.76 MIL/uL — ABNORMAL LOW (ref 4.22–5.81)
Retic Count, Absolute: 35.9 10*3/uL (ref 19.0–186.0)
Retic Ct Pct: 1.3 % (ref 0.4–3.1)

## 2020-04-14 LAB — IRON AND TIBC
Iron: 16 ug/dL — ABNORMAL LOW (ref 45–182)
Saturation Ratios: 10 % — ABNORMAL LOW (ref 17.9–39.5)
TIBC: 154 ug/dL — ABNORMAL LOW (ref 250–450)
UIBC: 138 ug/dL

## 2020-04-14 LAB — FERRITIN: Ferritin: 500 ng/mL — ABNORMAL HIGH (ref 24–336)

## 2020-04-14 LAB — FOLATE: Folate: 18.1 ng/mL (ref >5.9)

## 2020-04-14 LAB — PROTEIN / CREATININE RATIO, URINE
Creatinine, Urine: 89.55 mg/dL
Protein Creatinine Ratio: 2.99 mg/mg{Cre} — ABNORMAL HIGH (ref 0.00–0.15)
Total Protein, Urine: 268 mg/dL

## 2020-04-14 LAB — HIV ANTIBODY (ROUTINE TESTING W REFLEX): HIV Screen 4th Generation wRfx: NONREACTIVE

## 2020-04-14 LAB — SEDIMENTATION RATE: Sed Rate: 140 mm/h — ABNORMAL HIGH (ref 0–16)

## 2020-04-14 LAB — VITAMIN B12: Vitamin B-12: 239 pg/mL (ref 180–914)

## 2020-04-14 MED ORDER — VITAMIN B-12 1000 MCG PO TABS
1000.0000 ug | ORAL_TABLET | Freq: Every day | ORAL | Status: DC
  Administered 2020-04-14 – 2020-04-19 (×5): 1000 ug via ORAL
  Filled 2020-04-14 (×5): qty 1

## 2020-04-14 MED ORDER — BACLOFEN 5 MG HALF TABLET
5.0000 mg | ORAL_TABLET | Freq: Three times a day (TID) | ORAL | Status: DC | PRN
  Administered 2020-04-14 – 2020-04-19 (×3): 5 mg via ORAL
  Filled 2020-04-14 (×4): qty 1

## 2020-04-14 MED ORDER — VANCOMYCIN HCL 1500 MG/300ML IV SOLN
1500.0000 mg | INTRAVENOUS | Status: DC
  Administered 2020-04-14: 1500 mg via INTRAVENOUS
  Filled 2020-04-14: qty 300

## 2020-04-14 MED ORDER — FERROUS SULFATE 325 (65 FE) MG PO TABS
325.0000 mg | ORAL_TABLET | Freq: Every day | ORAL | Status: DC
  Administered 2020-04-14 – 2020-04-19 (×5): 325 mg via ORAL
  Filled 2020-04-14 (×5): qty 1

## 2020-04-15 ENCOUNTER — Inpatient Hospital Stay (HOSPITAL_COMMUNITY): Payer: No Typology Code available for payment source

## 2020-04-15 ENCOUNTER — Encounter (HOSPITAL_COMMUNITY): Payer: Self-pay | Admitting: Student in an Organized Health Care Education/Training Program

## 2020-04-15 DIAGNOSIS — L97521 Non-pressure chronic ulcer of other part of left foot limited to breakdown of skin: Secondary | ICD-10-CM

## 2020-04-15 DIAGNOSIS — E1142 Type 2 diabetes mellitus with diabetic polyneuropathy: Secondary | ICD-10-CM | POA: Diagnosis present

## 2020-04-15 DIAGNOSIS — R609 Edema, unspecified: Secondary | ICD-10-CM

## 2020-04-15 DIAGNOSIS — I70244 Atherosclerosis of native arteries of left leg with ulceration of heel and midfoot: Secondary | ICD-10-CM

## 2020-04-15 DIAGNOSIS — I739 Peripheral vascular disease, unspecified: Secondary | ICD-10-CM

## 2020-04-15 DIAGNOSIS — R7881 Bacteremia: Secondary | ICD-10-CM

## 2020-04-15 DIAGNOSIS — E43 Unspecified severe protein-calorie malnutrition: Secondary | ICD-10-CM | POA: Diagnosis present

## 2020-04-15 DIAGNOSIS — B9561 Methicillin susceptible Staphylococcus aureus infection as the cause of diseases classified elsewhere: Secondary | ICD-10-CM | POA: Diagnosis present

## 2020-04-15 LAB — BLOOD CULTURE ID PANEL (REFLEXED) - BCID2

## 2020-04-15 LAB — ECHOCARDIOGRAM COMPLETE
Area-P 1/2: 2.96 cm2
Height: 62 in
S' Lateral: 4.4 cm
Weight: 3520 oz

## 2020-04-15 LAB — GLUCOSE, CAPILLARY
Glucose-Capillary: 207 mg/dL — ABNORMAL HIGH (ref 70–99)
Glucose-Capillary: 175 mg/dL — ABNORMAL HIGH (ref 70–99)
Glucose-Capillary: 202 mg/dL — ABNORMAL HIGH (ref 70–99)
Glucose-Capillary: 218 mg/dL — ABNORMAL HIGH (ref 70–99)

## 2020-04-15 LAB — CBC WITH DIFFERENTIAL/PLATELET
Abs Immature Granulocytes: 0.19 10*3/uL — ABNORMAL HIGH (ref 0.00–0.07)
Basophils Absolute: 0.1 10*3/uL (ref 0.0–0.1)
Basophils Relative: 0 %
Eosinophils Absolute: 0.4 10*3/uL (ref 0.0–0.5)
Eosinophils Relative: 2 %
HCT: 23.7 % — ABNORMAL LOW (ref 39.0–52.0)
Hemoglobin: 7.9 g/dL — ABNORMAL LOW (ref 13.0–17.0)
Immature Granulocytes: 1 %
Lymphocytes Relative: 5 %
Lymphs Abs: 0.9 10*3/uL (ref 0.7–4.0)
MCH: 29.5 pg (ref 26.0–34.0)
MCHC: 33.3 g/dL (ref 30.0–36.0)
MCV: 88.4 fL (ref 80.0–100.0)
Monocytes Absolute: 1.5 10*3/uL — ABNORMAL HIGH (ref 0.1–1.0)
Monocytes Relative: 8 %
Neutro Abs: 15.2 10*3/uL — ABNORMAL HIGH (ref 1.7–7.7)
Neutrophils Relative %: 84 %
Platelets: 555 10*3/uL — ABNORMAL HIGH (ref 150–400)
RBC: 2.68 MIL/uL — ABNORMAL LOW (ref 4.22–5.81)
RDW: 13 % (ref 11.5–15.5)
WBC: 18.2 10*3/uL — ABNORMAL HIGH (ref 4.0–10.5)
nRBC: 0 % (ref 0.0–0.2)

## 2020-04-15 LAB — HEMOGLOBIN A1C
Hgb A1c MFr Bld: 6.9 % — ABNORMAL HIGH (ref 4.8–5.6)
Mean Plasma Glucose: 151 mg/dL

## 2020-04-15 MED ORDER — ATORVASTATIN CALCIUM 40 MG PO TABS
40.0000 mg | ORAL_TABLET | Freq: Every day | ORAL | Status: DC
  Administered 2020-04-16 – 2020-04-19 (×4): 40 mg via ORAL
  Filled 2020-04-15 (×4): qty 1

## 2020-04-15 MED ORDER — ASPIRIN 81 MG PO CHEW
81.0000 mg | CHEWABLE_TABLET | Freq: Every day | ORAL | Status: DC
  Administered 2020-04-15 – 2020-04-19 (×5): 81 mg via ORAL
  Filled 2020-04-15 (×5): qty 1

## 2020-04-15 MED ORDER — ATORVASTATIN CALCIUM 40 MG PO TABS: 40.0000 mg | ORAL_TABLET | Freq: Every day | ORAL | Status: DC

## 2020-04-15 MED ORDER — GLIPIZIDE ER 5 MG PO TB24
5.0000 mg | ORAL_TABLET | Freq: Every day | ORAL | Status: DC
  Administered 2020-04-16 – 2020-04-19 (×3): 5 mg via ORAL
  Filled 2020-04-15 (×4): qty 1

## 2020-04-15 MED ORDER — CEFAZOLIN SODIUM-DEXTROSE 2-4 GM/100ML-% IV SOLN
2.0000 g | Freq: Three times a day (TID) | INTRAVENOUS | Status: DC
  Administered 2020-04-15 – 2020-04-19 (×14): 2 g via INTRAVENOUS
  Filled 2020-04-15 (×14): qty 100

## 2020-04-15 MED ORDER — SODIUM CHLORIDE 0.9 % IV SOLN: INTRAVENOUS | Status: DC

## 2020-04-15 NOTE — Progress Notes (Signed)
°  Echocardiogram 2D Echocardiogram has been performed.  Keith Garrett 04/15/2020, 3:35 PM

## 2020-04-15 NOTE — Consult Note (Signed)
ORTHOPAEDIC CONSULTATION  REQUESTING PHYSICIAN: McDiarmid, Blane Ohara, MD  Chief Complaint: Ischemic ulcer lateral left foot.  HPI: Keith Garrett is a 54 y.o. male who presents with ischemic ulcer lateral left foot.  Patient denies any specific trauma.  Past Medical History:  Diagnosis Date  . Cellulitis of foot, left 04/13/2020  . Diabetes mellitus without complication ([place])   . Hypertension   . Traumatic rupture of plantar fascia of left foot 04/14/2020   History reviewed. No pertinent surgical history. Social History   Socioeconomic History  . Marital status: Married    Spouse name: Not on file  . Number of children: Not on file  . Years of education: Not on file  . Highest education level: Not on file  Occupational History  . Not on file  Tobacco Use  . Smoking status: Never Smoker  Substance and Sexual Activity  . Alcohol use: Yes    Comment: occasionally  . Drug use: No  . Sexual activity: Not on file  Other Topics Concern  . Not on file  Social History Narrative  . Not on file   Social Determinants of Health   Financial Resource Strain:   . Difficulty of Paying Living Expenses: Not on file  Food Insecurity:   . Worried About Charity fundraiser in the Last Year: Not on file  . Ran Out of Food in the Last Year: Not on file  Transportation Needs:   . Lack of Transportation (Medical): Not on file  . Lack of Transportation (Non-Medical): Not on file  Physical Activity:   . Days of Exercise per Week: Not on file  . Minutes of Exercise per Session: Not on file  Stress:   . Feeling of Stress : Not on file  Social Connections:   . Frequency of Communication with Friends and Family: Not on file  . Frequency of Social Gatherings with Friends and Family: Not on file  . Attends Religious Services: Not on file  . Active Member of Clubs or Organizations: Not on file  . Attends Archivist Meetings: Not on file  . Marital Status: Not on file    No family history on file. - negative except otherwise stated in the family history section No Known Allergies Prior to Admission medications   Medication Sig Start Date End Date Taking? Authorizing Provider  amLODipine (NORVASC) 10 MG tablet Take 10 mg by mouth daily. 02/29/20  Yes [provider]  glipiZIDE (GLUCOTROL) 10 MG tablet Take 10 mg by mouth daily. 02/29/20  Yes [provider]  ibuprofen (ADVIL) 200 MG tablet Take 200 mg by mouth every 4 (four) hours as needed for moderate pain.   Yes [provider]  lisinopril (ZESTRIL) 40 MG tablet Take 40 mg by mouth daily. 02/29/20  Yes [provider]  metFORMIN (GLUCOPHAGE) 1000 MG tablet Take 1,000 mg by mouth 2 (two) times daily. 02/29/20  Yes [provider]  methocarbamol (ROBAXIN) 500 MG tablet Take 1 tablet (500 mg total) by mouth 2 (two) times daily. 04/09/20  Yes Varney Biles, MD  aspirin EC 81 MG tablet Take 81 mg by mouth daily. Patient not taking: Reported on 04/15/2020    [provider]  dorzolamide-timolol (COSOPT) 22.3-6.8 MG/ML ophthalmic solution Place 1 drop into the left eye 2 (two) times daily. Patient not taking: Reported on 04/15/2020 12/27/19   [provider]  HYDROcodone-acetaminophen (NORCO/VICODIN) 5-325 MG per tablet Take 1-2 tablets by mouth every 4 (four) hours  as needed. Patient not taking: Reported on 04/15/2020 02/13/14   Dorie Rank, MD  metFORMIN (GLUMETZA) 500 MG (MOD) 24 hr tablet Take 1,000 mg by mouth 2 (two) times daily. Patient not taking: Reported on 04/15/2020    [provider]  naproxen (NAPROSYN) 375 MG tablet Take 1 tablet (375 mg total) by mouth 2 (two) times daily. 04/09/20   Varney Biles, MD  ofloxacin (OCUFLOX) 0.3 % ophthalmic solution Place 1 drop into both eyes 4 (four) times daily. 7 day supply Patient not taking: Reported on 04/15/2020 02/21/20   [provider]  prednisoLONE acetate (PRED FORTE) 1 % ophthalmic  suspension Place 1 drop into the left eye See admin instructions. 1 drop in left eye 4 time daily for 1 week 3 times daily for 1 week 2 times daily for 1 week 1 times daily for 1 week Patient not taking: Reported on 04/15/2020 12/27/19   [provider]  PRESCRIPTION MEDICATION Take 1 tablet by mouth daily. Pt cannot remember name or strength. Not on [facility] at [facility], where pt says [facility]. "Heart Medication" Patient not taking: Reported on 04/15/2020    [provider]   MR FOOT LEFT W WO CONTRAST  Result Date: 04/13/2020 CLINICAL DATA:  Draining left foot wound with redness and swelling EXAM: MRI OF THE LEFT FOOT WITHOUT AND WITH CONTRAST TECHNIQUE: Multiplanar, multisequence MR imaging of the left midfoot was performed both before and after administration of intravenous contrast. CONTRAST:  37mL GADAVIST GADOBUTROL 1 MMOL/ML IV SOLN COMPARISON:  X-ray 04/13/2020 FINDINGS: There is a superficial skin ulceration at the lateral aspect of the hindfoot at the level of the calcaneocuboid joint (series 10, image 24). There is associated edema within the underlying soft tissues, likely representing cellulitis. No organized or rim enhancing fluid collection near the ulcer base. TENDONS Peroneal: The distal peroneal tendons closely approximate the area of soft tissue ulceration and inflammation. There is small volume distal peroneal tenosynovitis. Tendons appear otherwise intact. Posteromedial: Tibialis posterior, flexor hallucis longus, and flexor digitorum longus tendons intact. No tenosynovitis. Anterior: Intact and normally positioned. Achilles: Intact. Plantar Fascia: Central band of the plantar fascia is markedly thickened measuring 1.1 cm in thickness proximally. There is a high-grade near complete tear of the central band at the enthesis (series 8, image 16). A portion of the dorsal most fibers of the fascial band remain intact. LIGAMENTS Lateral: The anterior and posterior tibiofibular  ligaments are intact. The anterior and posterior talofibular ligaments are intact. Intact calcaneofibular ligament. Medial: The deltoid and visualized portions of the spring ligament appear intact. CARTILAGE AND BONES Ankle Joint: Small tibiotalar joint effusion.  No cartilage defect. Subtalar Joints/Sinus Tarsi: No cartilage defect. Small posterior subtalar joint effusion. Preservation of the anatomic fat within the sinus tarsi. Bones: There is prominent patchy bone marrow edema within the calcaneal body and plantar aspect of the calcaneal tuberosity (series 8, image 19) with patchy low to intermediate T1 signal (series 9, images 24-28). No definite cortical erosion. No clearly defined fracture line. Linear-branching areas of low signal within the calcaneus are favored to represent vascular channels. Remaining osseous structures are otherwise within normal limits. No malalignment. Other: Mild diffuse subcutaneous edema. There is a small fluid collection along the medial margin of the mid second metatarsal diaphysis measuring 1.3 x 0.3 x 1.1 cm with peripheral rim enhancement (series 7, image 3; series 10, image 23). There is diffuse edema-like signal throughout the intrinsic foot musculature which may reflect a nonspecific myositis.  IMPRESSION: 1. Superficial skin ulceration at the lateral aspect of the hindfoot at the level of the calcaneocuboid joint with associated edema within the underlying soft tissues, likely representing cellulitis. There is mild tenosynovitis of the traversing peroneal tendons. No organized or rim enhancing fluid collection near the ulcer base. 2. Prominent patchy bone marrow edema within the calcaneal body and plantar aspect of the calcaneal tuberosity. There is patchy low to intermediate T1 signal within the calcaneal body and plantar aspect of the calcaneal tuberosity without definite cortical erosion. Findings may be related to reactive osteitis although early acute osteomyelitis is  difficult to exclude. 3. High-grade near complete tear of the central band of the plantar fascia from its proximal attachment on the calcaneal tuberosity. 4. Small fluid collection along the medial margin of the mid second metatarsal diaphysis measuring 1.3 x 0.3 x 1.1 cm with peripheral rim enhancement, suggesting a small abscess. 5. Diffuse edema-like signal throughout the intrinsic foot musculature which may reflect a nonspecific myositis. Electronically Signed   By: Davina Poke D.O.   On: 04/13/2020 17:29   DG Foot Complete Left  Result Date: 04/13/2020 CLINICAL DATA:  Erythema and edema of left foot with open draining wound laterally. EXAM: LEFT FOOT - COMPLETE 3+ VIEW COMPARISON:  None. FINDINGS: No fracture, dislocation or bony destruction is identified. No evidence of soft tissue foreign body. Extensive vascular calcifications are noted involving arteries of the ankle and foot consistent with underlying diabetes. No bony lesions identified. IMPRESSION: No acute findings. Extensive vascular calcifications consistent with underlying diabetes. Electronically Signed   By: Aletta Edouard M.D.   On: 04/13/2020 13:33   - pertinent xrays, CT, MRI studies were reviewed and independently interpreted  Positive ROS: All other systems have been reviewed and were otherwise negative with the exception of those mentioned in the HPI and as above.  Physical Exam: General: Alert, no acute distress Psychiatric: Patient is competent for consent with normal mood and affect Lymphatic: No axillary or cervical lymphadenopathy Cardiovascular: No pedal edema Respiratory: No cyanosis, no use of accessory musculature GI: No organomegaly, abdomen is soft and non-tender    Images:  @ENCIMAGES @  Labs:  Lab Results  Component Value Date   ESRSEDRATE 140 (H) 04/14/2020   CRP 21.0 (H) 04/14/2020   REPTSTATUS PENDING 04/13/2020   GRAMSTAIN  04/13/2020    RARE WBC PRESENT, PREDOMINANTLY PMN RARE GRAM  POSITIVE COCCI Performed at [facility] [address], [place], [place] [address]    CULT FEW STAPHYLOCOCCUS AUREUS 04/13/2020    Lab Results  Component Value Date   ALBUMIN 1.8 (L) 04/14/2020   ALBUMIN 3.9 09/21/2009    Neurologic: Patient does not have protective sensation bilateral lower extremities.   MUSCULOSKELETAL:   Skin: Examination patient has ischemic skin changes of the lateral aspect of his foot with a necrotic ulcer over the cuboid approximately 3 cm in diameter.  Patient does not have a palpable dorsalis pedis pulse.  Review of the MRI scan shows edema over the lateral foot there is some superficial reactive edema in the bone but no evidence of chronic osteomyelitis.  No purulent abscess.  Assessment: Assessment: Diabetic insensate neuropathy with severe peripheral vascular disease with severe protein caloric malnutrition with ischemic ulcer of the lateral aspect of the left foot.  Plan: The ankle-brachial indices has been ordered please consult vascular surgery for work-up of the peripheral vascular disease.  I will follow-up as needed.  Thank you for the consult and the opportunity  to see Keith Garrett, [place] [phone] 7:52 AM

## 2020-04-15 NOTE — Progress Notes (Signed)
° ° °  [facility] has been requested to perform a transesophageal echocardiogram on 10/26 for bacteremia.  After careful review of history and examination, the risks and benefits of transesophageal echocardiogram have been explained including risks of esophageal damage, perforation (1:10,000 risk), bleeding, pharyngeal hematoma as well as other potential complications associated with conscious sedation including aspiration, arrhythmia, respiratory failure and death. Alternatives to treatment were discussed, questions were answered. Patient is willing to proceed.   Rosaria Ferries, PA-C 04/15/2020 3:27 PM
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
## 2020-04-15 NOTE — Progress Notes (Signed)
PHARMACY - PHYSICIAN COMMUNICATION CRITICAL VALUE ALERT - BLOOD CULTURE IDENTIFICATION (BCID)  Keith Garrett is an 54 y.o. male who presented to [facility] on 04/13/2020 with a chief complaint of L foot swelling and pain, possible osteomyelitis  Assessment:  1/2 blood cultures growing MSSA  Name of physician (or Provider) Contacted:  Dr. Kai Levins  Current antibiotics: Vancomycin and Rocephin  Changes to prescribed antibiotics recommended:  Change to Ancef 2 g IV q8h  Results for orders placed or performed during the hospital encounter of 04/13/20  Blood Culture ID Panel (Reflexed) (Collected: 04/13/2020  2:15 PM)  Result Value Ref Range   Enterococcus faecalis NOT DETECTED NOT DETECTED   Enterococcus Faecium NOT DETECTED NOT DETECTED   Listeria monocytogenes NOT DETECTED NOT DETECTED   Staphylococcus species DETECTED (A) NOT DETECTED   Staphylococcus aureus (BCID) DETECTED (A) NOT DETECTED   Staphylococcus epidermidis NOT DETECTED NOT DETECTED   Staphylococcus lugdunensis NOT DETECTED NOT DETECTED   Streptococcus species NOT DETECTED NOT DETECTED   Streptococcus agalactiae NOT DETECTED NOT DETECTED   Streptococcus pneumoniae NOT DETECTED NOT DETECTED   Streptococcus pyogenes NOT DETECTED NOT DETECTED   A.calcoaceticus-baumannii NOT DETECTED NOT DETECTED   Bacteroides fragilis NOT DETECTED NOT DETECTED   Enterobacterales NOT DETECTED NOT DETECTED   Enterobacter cloacae complex NOT DETECTED NOT DETECTED   Escherichia coli NOT DETECTED NOT DETECTED   Klebsiella aerogenes NOT DETECTED NOT DETECTED   Klebsiella oxytoca NOT DETECTED NOT DETECTED   Klebsiella pneumoniae NOT DETECTED NOT DETECTED   Proteus species NOT DETECTED NOT DETECTED   Salmonella species NOT DETECTED NOT DETECTED   Serratia marcescens NOT DETECTED NOT DETECTED   Haemophilus influenzae NOT DETECTED NOT DETECTED   Neisseria meningitidis NOT DETECTED NOT DETECTED   Pseudomonas aeruginosa NOT  DETECTED NOT DETECTED   Stenotrophomonas maltophilia NOT DETECTED NOT DETECTED   Candida albicans NOT DETECTED NOT DETECTED   Candida auris NOT DETECTED NOT DETECTED   Candida glabrata NOT DETECTED NOT DETECTED   Candida krusei NOT DETECTED NOT DETECTED   Candida parapsilosis NOT DETECTED NOT DETECTED   Candida tropicalis NOT DETECTED NOT DETECTED   Cryptococcus neoformans/gattii NOT DETECTED NOT DETECTED   Meth resistant mecA/C and MREJ NOT DETECTED NOT DETECTED    Caryl Pina 04/15/2020  1:59 AM

## 2020-04-15 NOTE — Consult Note (Signed)
Referring Physician: Hospitalist service.  Patient name: Keith Garrett MRN: [id number] DOB: 1966/01/14 Sex: male  REASON FOR CONSULT: left foot wound  HPI: Keith Garrett is a 54 y.o. male, who spontaneously developed wound and cellulitis on left foot several days ago.  He does not recall any trauma or how wound started.  He does not smoke.  He does have diabetes.  No prior episodes no claudication.  Past Medical History:  Diagnosis Date  . Cellulitis of foot, left 04/13/2020  . Diabetes mellitus without complication ([place])   . Hypertension   . Traumatic rupture of plantar fascia of left foot 04/14/2020   History reviewed. No pertinent surgical history.  History reviewed. No pertinent family history.  SOCIAL HISTORY: Social History   Socioeconomic History  . Marital status: Married    Spouse name: Not on file  . Number of children: Not on file  . Years of education: Not on file  . Highest education level: Not on file  Occupational History  . Not on file  Tobacco Use  . Smoking status: Never Smoker  . Smokeless tobacco: Never Used  Substance and Sexual Activity  . Alcohol use: Yes    Comment: occasionally  . Drug use: No  . Sexual activity: Not Currently  Other Topics Concern  . Not on file  Social History Narrative  . Not on file   Social Determinants of Health   Financial Resource Strain:   . Difficulty of Paying Living Expenses: Not on file  Food Insecurity:   . Worried About Charity fundraiser in the Last Year: Not on file  . Ran Out of Food in the Last Year: Not on file  Transportation Needs:   . Lack of Transportation (Medical): Not on file  . Lack of Transportation (Non-Medical): Not on file  Physical Activity:   . Days of Exercise per Week: Not on file  . Minutes of Exercise per Session: Not on file  Stress:   . Feeling of Stress : Not on file  Social Connections:   . Frequency of Communication with Friends and Family:  Not on file  . Frequency of Social Gatherings with Friends and Family: Not on file  . Attends Religious Services: Not on file  . Active Member of Clubs or Organizations: Not on file  . Attends Archivist Meetings: Not on file  . Marital Status: Not on file  Intimate Partner Violence:   . Fear of Current or Ex-Partner: Not on file  . Emotionally Abused: Not on file  . Physically Abused: Not on file  . Sexually Abused: Not on file    No Known Allergies  Current Facility-Administered Medications  Medication Dose Route Frequency Provider Last Rate Last Admin  . 0.9 %  sodium chloride infusion  250 mL Intravenous PRN Briant Cedar, MD      . Derrill Memo ON 04/16/2020] 0.9 %  sodium chloride infusion   Intravenous Continuous Barrett, Rhonda G, PA-C      . acetaminophen (TYLENOL) tablet 650 mg  650 mg Oral Q6H PRN Briant Cedar, MD   650 mg at 04/14/20 2044   Or  . acetaminophen (TYLENOL) suppository 650 mg  650 mg Rectal Q6H PRN Briant Cedar, MD      . amLODipine (NORVASC) tablet 10 mg  10 mg Oral Daily Benay Pike, MD   10 mg at 04/15/20 3419  . aspirin chewable tablet 81 mg  81 mg Oral Daily McDiarmid, Sherren Mocha  D, MD   81 mg at 04/15/20 1222  . [START ON 04/16/2020] atorvastatin (LIPITOR) tablet 40 mg  40 mg Oral Daily McDiarmid, Blane Ohara, MD      . baclofen (LIORESAL) tablet 5 mg  5 mg Oral TID PRN Darrelyn Hillock N, DO   5 mg at 04/14/20 1116  . ceFAZolin (ANCEF) IVPB 2g/100 mL premix  2 g Intravenous Q8H Briant Cedar, MD 200 mL/hr at 04/15/20 1623 2 g at 04/15/20 1623  . enoxaparin (LOVENOX) injection 40 mg  40 mg Subcutaneous Q24H Briant Cedar, MD   40 mg at 04/15/20 1750  . ferrous sulfate tablet 325 mg  325 mg Oral Daily Ganta, Anupa, DO   325 mg at 04/15/20 0837  . [START ON 04/16/2020] glipiZIDE (GLUCOTROL XL) 24 hr tablet 5 mg  5 mg Oral Q breakfast McDiarmid, Blane Ohara, MD      . insulin aspart (novoLOG) injection 0-5 Units  0-5 Units  Subcutaneous QHS Pashayan, Redgie Grayer, MD      . insulin aspart (novoLOG) injection 0-9 Units  0-9 Units Subcutaneous TID WC Briant Cedar, MD   2 Units at 04/15/20 1751  . polyethylene glycol (MIRALAX / GLYCOLAX) packet 17 g  17 g Oral Daily PRN Briant Cedar, MD      . sodium chloride flush (NS) 0.9 % injection 3 mL  3 mL Intravenous Q12H Briant Cedar, MD   3 mL at 04/14/20 1117  . sodium chloride flush (NS) 0.9 % injection 3 mL  3 mL Intravenous PRN Briant Cedar, MD   3 mL at 04/13/20 2057  . vitamin B-12 (CYANOCOBALAMIN) tablet 1,000 mcg  1,000 mcg Oral Daily Ganta, Anupa, DO   1,000 mcg at 04/15/20 0837    ROS:   General:  No weight loss, Fever, chills  HEENT: No recent headaches, no nasal bleeding, no visual changes, no sore throat  Neurologic: No dizziness, blackouts, seizures. No recent symptoms of stroke or mini- stroke. No recent episodes of slurred speech, or temporary blindness.  Cardiac: No recent episodes of chest pain/pressure, no shortness of breath at rest.  No shortness of breath with exertion.  Denies history of atrial fibrillation or irregular heartbeat  Vascular: No history of rest pain in feet.  No history of claudication.  No history of non-healing ulcer, No history of DVT   Pulmonary: No home oxygen, no productive cough, no hemoptysis,  No asthma or wheezing  Musculoskeletal:  [ ]  Arthritis, [ ]  Low back pain,  [ ]  Joint pain  Hematologic:No history of hypercoagulable state.  No history of easy bleeding.  No history of anemia  Gastrointestinal: No hematochezia or melena,  No gastroesophageal reflux, no trouble swallowing  Urinary: [ ]  chronic Kidney disease, [ ]  on HD - [ ]  MWF or [ ]  TTHS, [ ]  Burning with urination, [ ]  Frequent urination, [ ]  Difficulty urinating;   Skin: No rashes  Psychological: No history of anxiety,  No history of depression   Physical Examination  Vitals:   04/15/20 0800 04/15/20 0823 04/15/20  1140 04/15/20 1641  BP:  (!) 150/81 (!) 143/79 (!) 151/78  Pulse:  89 88 92  Resp: 18 18 18 18   Temp:  99.1 F (37.3 C) 99 F (37.2 C) 99.5 F (37.5 C)  TempSrc:  Oral Oral Oral  SpO2: 96% 95% 100% 93%  Weight:      Height:        Body mass index  is 40.24 kg/m.  General:  Alert and oriented, no acute distress HEENT: Normal Neck: No JVD Cardiac: Regular Rate and Rhythm Abdomen: Soft, non-tender, non-distended, no mass Skin: No rash Extremity Pulses:  2+ radial, brachial, femoral,2+ right absent left dorsalis pedis, absent posterior tibial pulses bilaterally Musculoskeletal: edema left leg from foot to knee level Neurologic: Upper and lower extremity motor 5/5 and symmetric  DATA:  ABI calcified triphasic toe pressure 50 on left 100 on right  No DVT left leg  MRI cellulitis myositis possible osteo  Left foot xray severely calcified tibial vessels  ASSESSMENT:  Non healing wound left foot with probably osteo no palpable pedal pulses decreased toe pressure 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   PLAN:  Will schedule for angio with Dr Carlis Abbott on Wednesday   Ruta Hinds, MD [facility] Office: [phone]

## 2020-04-15 NOTE — Progress Notes (Signed)
ABI and lower extremity venous has been completed.   Preliminary results in CV Proc.   Keith Garrett 04/15/2020 2:22 PM
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
## 2020-04-16 ENCOUNTER — Inpatient Hospital Stay (HOSPITAL_COMMUNITY): Payer: No Typology Code available for payment source | Admitting: Certified Registered Nurse Anesthetist

## 2020-04-16 ENCOUNTER — Encounter (HOSPITAL_COMMUNITY)
Admission: EM | Disposition: A | Discharge status: Home Health | Payer: Self-pay | Source: Home / Self Care | Attending: Family Medicine

## 2020-04-16 ENCOUNTER — Inpatient Hospital Stay (HOSPITAL_COMMUNITY): Payer: No Typology Code available for payment source

## 2020-04-16 DIAGNOSIS — R7881 Bacteremia: Secondary | ICD-10-CM

## 2020-04-16 DIAGNOSIS — I10 Essential (primary) hypertension: Secondary | ICD-10-CM

## 2020-04-16 HISTORY — PX: TEE WITHOUT CARDIOVERSION: SHX5443

## 2020-04-16 LAB — LIPID PANEL
Cholesterol: 122 mg/dL (ref 0–200)
HDL: 20 mg/dL — ABNORMAL LOW (ref >40)
LDL Cholesterol: 77 mg/dL (ref 0–99)
Total CHOL/HDL Ratio: 6.1 RATIO
Triglycerides: 127 mg/dL (ref <150)
VLDL: 25 mg/dL (ref 0–40)

## 2020-04-16 LAB — HEMOGLOBIN A1C
Hgb A1c MFr Bld: 6.7 % — ABNORMAL HIGH (ref 4.8–5.6)
Mean Plasma Glucose: 146 mg/dL

## 2020-04-16 LAB — BASIC METABOLIC PANEL
Anion gap: 9 (ref 5–15)
BUN: 18 mg/dL (ref 6–20)
CO2: 22 mmol/L (ref 22–32)
Calcium: 8.2 mg/dL — ABNORMAL LOW (ref 8.9–10.3)
Chloride: 103 mmol/L (ref 98–111)
Creatinine, Ser: 1.06 mg/dL (ref 0.61–1.24)
GFR, Estimated: >60 mL/min (ref >60)
Glucose, Bld: 193 mg/dL — ABNORMAL HIGH (ref 70–99)
Potassium: 3.9 mmol/L (ref 3.5–5.1)
Sodium: 134 mmol/L — ABNORMAL LOW (ref 135–145)

## 2020-04-16 LAB — CULTURE, BLOOD (ROUTINE X 2)

## 2020-04-16 LAB — CBC
HCT: 24.3 % — ABNORMAL LOW (ref 39.0–52.0)
Hemoglobin: 8.1 g/dL — ABNORMAL LOW (ref 13.0–17.0)
MCH: 29.5 pg (ref 26.0–34.0)
MCHC: 33.3 g/dL (ref 30.0–36.0)
MCV: 88.4 fL (ref 80.0–100.0)
Platelets: 616 10*3/uL — ABNORMAL HIGH (ref 150–400)
RBC: 2.75 MIL/uL — ABNORMAL LOW (ref 4.22–5.81)
RDW: 13.1 % (ref 11.5–15.5)
WBC: 18.1 10*3/uL — ABNORMAL HIGH (ref 4.0–10.5)
nRBC: 0 % (ref 0.0–0.2)

## 2020-04-16 LAB — GLUCOSE, CAPILLARY
Glucose-Capillary: 226 mg/dL — ABNORMAL HIGH (ref 70–99)
Glucose-Capillary: 289 mg/dL — ABNORMAL HIGH (ref 70–99)
Glucose-Capillary: 194 mg/dL — ABNORMAL HIGH (ref 70–99)
Glucose-Capillary: 189 mg/dL — ABNORMAL HIGH (ref 70–99)
Glucose-Capillary: 140 mg/dL — ABNORMAL HIGH (ref 70–99)

## 2020-04-16 SURGERY — ECHOCARDIOGRAM, TRANSESOPHAGEAL
Anesthesia: Monitor Anesthesia Care

## 2020-04-16 MED ORDER — PREDNISOLONE ACETATE 1 % OP SUSP: 1.0000 [drp] | OPHTHALMIC | Status: DC

## 2020-04-16 MED ORDER — DORZOLAMIDE HCL-TIMOLOL MAL 2-0.5 % OP SOLN
1.0000 [drp] | Freq: Two times a day (BID) | OPHTHALMIC | Status: DC
  Administered 2020-04-16 – 2020-04-19 (×6): 1 [drp] via OPHTHALMIC
  Filled 2020-04-16: qty 10

## 2020-04-16 MED ORDER — LIDOCAINE 2% (20 MG/ML) 5 ML SYRINGE
INTRAMUSCULAR | Status: DC | PRN
  Administered 2020-04-16: 100 mg via INTRAVENOUS

## 2020-04-16 MED ORDER — PROPOFOL 10 MG/ML IV BOLUS
INTRAVENOUS | Status: DC | PRN
  Administered 2020-04-16: 100 mg via INTRAVENOUS

## 2020-04-16 MED ORDER — PROPOFOL 500 MG/50ML IV EMUL
INTRAVENOUS | Status: DC | PRN
  Administered 2020-04-16: 100 ug/kg/min via INTRAVENOUS

## 2020-04-16 NOTE — Transfer of Care (Signed)
Immediate Anesthesia Transfer of Care Note  Patient: Keith Garrett  Procedure(s) Performed: TRANSESOPHAGEAL ECHOCARDIOGRAM (TEE) (N/A )  Patient Location: Endoscopy Unit  Anesthesia Type:MAC  Level of Consciousness: awake, alert  and oriented  Airway & Oxygen Therapy: Patient Spontanous Breathing  Post-op Assessment: Report given to RN and Post -op Vital signs reviewed and stable  Post vital signs: Reviewed and stable  Last Vitals:  Vitals Value Taken Time  BP    Temp    Pulse 70 04/16/20 1136  Resp 17 04/16/20 1136  SpO2 98 % 04/16/20 1136    Last Pain:  Vitals:   04/16/20 1036  TempSrc: Oral  PainSc: 0-No pain      Patients Stated Pain Goal: 0 (27/07/86 7544)  Complications: No complications documented.

## 2020-04-16 NOTE — Progress Notes (Signed)
[facility]  Date of Admission:  04/13/2020   Total days of antibiotics 4        Day 2 cefazolin        S/p 2 days of vancomycin, ceftriaxone  ASSESSMENT: Keith Garrett is 54yo male with diabetes, hypertension admitted 10/23 admitted for left foot cellulitis and abscess with positive BCx and WCx for MSSA. Currently on day 2 cefazolin (total 4 days of abx). R/p BCx yesterday no growth. TTE yesterday without vegetations, grade II diastolic dysfunction. 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  PLAN: 1. Will continue with cefazolin 2g q8 2. TEE scheduled today 3. Monitor back pain, low threshold for MRI if changes occur 4. Angio tomorrow per vascular  Principal Problem:   Foot ulcer, left, limited to breakdown of skin (HCC) Active Problems:   Cellulitis of foot, left   Reactive thrombocytosis   Normocytic anemia   Hypoalbuminemia   Degenerative joint disease (DJD) of lumbar spine   Bone marrow edema of left calcaneus   Tenosynovitis, mild, of the traversing left peroneal tendons.   Traumatic rupture of plantar fascia of left foot   Foot abscess, left, posssible   Myositis of left foot   PVD (peripheral vascular disease) (HCC)   Severe protein-calorie malnutrition (HCC)   Diabetic polyneuropathy associated with type 2 diabetes mellitus (HCC)   Staphylococcus aureus bacteremia   Scheduled Meds: . amLODipine  10 mg Oral Daily  . aspirin  81 mg Oral Daily  . atorvastatin  40 mg Oral Daily  . enoxaparin (LOVENOX) injection  40 mg Subcutaneous Q24H  . ferrous sulfate  325 mg Oral Daily  . glipiZIDE  5 mg Oral Q breakfast  . insulin aspart  0-5 Units Subcutaneous QHS  . insulin aspart  0-9 Units Subcutaneous TID WC  . sodium chloride flush  3 mL Intravenous Q12H  . vitamin B-12  1,000 mcg Oral Daily   Continuous Infusions: . sodium chloride    . sodium chloride 20 mL/hr at 04/16/20  0630  .  ceFAZolin (ANCEF) IV 2 g (04/16/20 0911)   PRN Meds:.sodium chloride, acetaminophen **OR** acetaminophen, baclofen, polyethylene glycol, sodium chloride flush   SUBJECTIVE: Keith Garrett doing well this morning.  Mentions he is still having some pain in left foot, but it is manageable. Continues to endorse some lower left back pain, no change from yesterday. Denies CP, palpitations.  Review of Systems: Other systems negative.  No Known Allergies  OBJECTIVE: Vitals:   04/15/20 2007 04/15/20 2347 04/16/20 0447 04/16/20 0902  BP: (!) 146/67 (!) 161/73 (!) 142/73 (!) 154/80  Pulse:  94 88 88  Resp: 18 19 20 20   Temp: (!) 100.9 F (38.3 C) 99.6 F (37.6 C) 99.2 F (37.3 C) 98.9 F (37.2 C)  TempSrc: Oral Oral Oral Oral  SpO2: 94% 93% 96% 95%  Weight:      Height:       Body mass index is 40.24 kg/m.  Physical Exam: General: Pleasant, no acute distress CV: Regular rate, rhythm. No m/r/g Pulm: Clear to auscultation. No wheezing, rales, or increased WOB. GI: Abdomen soft, non-tender, non-distended. Skin: Wound wrapped in bandage. Chronic skin changes bilateral lower extremities. MSK: No spinal tenderness.  Lab Results Lab Results  Component Value Date   WBC 18.2 (H) 04/15/2020   HGB 7.9 (L) 04/15/2020   HCT 23.7 (L) 04/15/2020   MCV 88.4 04/15/2020  PLT 555 (H) 04/15/2020    Lab Results  Component Value Date   CREATININE 0.95 04/14/2020   BUN 16 04/14/2020   NA 136 04/14/2020   K 3.9 04/14/2020   CL 106 04/14/2020   CO2 22 04/14/2020    Lab Results  Component Value Date   ALT 25 04/14/2020   AST 27 04/14/2020   ALKPHOS 159 (H) 04/14/2020   BILITOT 0.9 04/14/2020     Microbiology: Recent Results (from the past 240 hour(s))  Blood culture (routine x 2)     Status: None (Preliminary result)   Collection Time: 04/13/20  2:05 PM   Specimen: BLOOD  Result Value Ref Range Status   Specimen Description BLOOD RIGHT ANTECUBITAL  Final   Special  Requests   Final    BOTTLES DRAWN AEROBIC AND ANAEROBIC Blood Culture adequate volume   Culture   Final    NO GROWTH 3 DAYS Performed at [facility], [place] [address] [place], [place] [address]    Report Status PENDING  Incomplete  Blood culture (routine x 2)     Status: Abnormal   Collection Time: 04/13/20  2:15 PM   Specimen: BLOOD  Result Value Ref Range Status   Specimen Description BLOOD LEFT ANTECUBITAL  Final   Special Requests   Final    BOTTLES DRAWN AEROBIC AND ANAEROBIC Blood Culture results may not be optimal due to an excessive volume of blood received in culture bottles   Culture  Setup Time   Final    GRAM POSITIVE COCCI IN CLUSTERS AEROBIC BOTTLE ONLY CRITICAL RESULT CALLED TO, READ BACK BY AND VERIFIED WITH: G. ABBOTT,PHARMD 0100 04/15/2020 T. TYSOR Performed at [facility], [place] [address], [place], [place] [address]    Culture STAPHYLOCOCCUS AUREUS (A)  Final   Report Status 04/16/2020 FINAL  Final   Organism ID, Bacteria STAPHYLOCOCCUS AUREUS  Final      Susceptibility   Staphylococcus aureus - MIC*    CIPROFLOXACIN <=0.5 SENSITIVE Sensitive     ERYTHROMYCIN <=0.25 SENSITIVE Sensitive     GENTAMICIN <=0.5 SENSITIVE Sensitive     OXACILLIN 0.5 SENSITIVE Sensitive     TETRACYCLINE <=1 SENSITIVE Sensitive     VANCOMYCIN 1 SENSITIVE Sensitive     TRIMETH/SULFA <=10 SENSITIVE Sensitive     CLINDAMYCIN <=0.25 SENSITIVE Sensitive     RIFAMPIN <=0.5 SENSITIVE Sensitive     Inducible Clindamycin NEGATIVE Sensitive     * STAPHYLOCOCCUS AUREUS  Blood Culture ID Panel (Reflexed)     Status: Abnormal   Collection Time: 04/13/20  2:15 PM  Result Value Ref Range Status   Enterococcus faecalis NOT DETECTED NOT DETECTED Final   Enterococcus Faecium NOT DETECTED NOT DETECTED Final   Listeria monocytogenes NOT DETECTED NOT DETECTED Final   Staphylococcus species DETECTED (A) NOT DETECTED Final    Comment: CRITICAL RESULT CALLED TO, READ BACK BY AND VERIFIED  WITH: G. ABBOTT,PHARMD 0100 04/15/2020 T. TYSOR    Staphylococcus aureus (BCID) DETECTED (A) NOT DETECTED Final    Comment: CRITICAL RESULT CALLED TO, READ BACK BY AND VERIFIED WITH: G. ABBOTT,PHARMD 0100 04/15/2020 T. TYSOR    Staphylococcus epidermidis NOT DETECTED NOT DETECTED Final   Staphylococcus lugdunensis NOT DETECTED NOT DETECTED Final   Streptococcus species NOT DETECTED NOT DETECTED Final   Streptococcus agalactiae NOT DETECTED NOT DETECTED Final   Streptococcus pneumoniae NOT DETECTED NOT DETECTED Final   Streptococcus pyogenes NOT DETECTED NOT DETECTED Final   A.calcoaceticus-baumannii NOT DETECTED NOT  DETECTED Final   Bacteroides fragilis NOT DETECTED NOT DETECTED Final   Enterobacterales NOT DETECTED NOT DETECTED Final   Enterobacter cloacae complex NOT DETECTED NOT DETECTED Final   Escherichia coli NOT DETECTED NOT DETECTED Final   Klebsiella aerogenes NOT DETECTED NOT DETECTED Final   Klebsiella oxytoca NOT DETECTED NOT DETECTED Final   Klebsiella pneumoniae NOT DETECTED NOT DETECTED Final   Proteus species NOT DETECTED NOT DETECTED Final   Salmonella species NOT DETECTED NOT DETECTED Final   Serratia marcescens NOT DETECTED NOT DETECTED Final   Haemophilus influenzae NOT DETECTED NOT DETECTED Final   Neisseria meningitidis NOT DETECTED NOT DETECTED Final   Pseudomonas aeruginosa NOT DETECTED NOT DETECTED Final   Stenotrophomonas maltophilia NOT DETECTED NOT DETECTED Final   Candida albicans NOT DETECTED NOT DETECTED Final   Candida auris NOT DETECTED NOT DETECTED Final   Candida glabrata NOT DETECTED NOT DETECTED Final   Candida krusei NOT DETECTED NOT DETECTED Final   Candida parapsilosis NOT DETECTED NOT DETECTED Final   Candida tropicalis NOT DETECTED NOT DETECTED Final   Cryptococcus neoformans/gattii NOT DETECTED NOT DETECTED Final   Meth resistant mecA/C and MREJ NOT DETECTED NOT DETECTED Final    Comment: Performed at [facility], [address] [place], [place] [address]  Respiratory Panel by RT PCR (Flu A&B, Covid) - Nasopharyngeal Swab     Status: None   Collection Time: 04/13/20  3:51 PM   Specimen: Nasopharyngeal Swab  Result Value Ref Range Status   SARS Coronavirus 2 by RT PCR NEGATIVE NEGATIVE Final    Comment: (NOTE) SARS-CoV-2 target nucleic acids are NOT DETECTED.  The SARS-CoV-2 RNA is generally detectable in upper respiratoy specimens during the acute phase of infection. The lowest concentration of SARS-CoV-2 viral copies this assay can detect is 131 copies/mL. A negative result does not preclude SARS-Cov-2 infection and should not be used as the sole basis for treatment or other Keith management decisions. A negative result may occur with  improper specimen collection/handling, submission of specimen other than nasopharyngeal swab, presence of viral mutation(s) within the areas targeted by this assay, and inadequate number of viral copies (<131 copies/mL). A negative result must be combined with clinical observations, Keith history, and epidemiological information. The expected result is Negative.  Fact Sheet for Patients:  PinkCheek.be  Fact Sheet for Healthcare Providers:  GravelBags.it  This test is no t yet approved or cleared by the [place] FDA and  has been authorized for detection and/or diagnosis of SARS-CoV-2 by FDA under an Emergency Use Authorization (EUA). This EUA will remain  in effect (meaning this test can be used) for the duration of the COVID-19 declaration under Section 564(b)(1) of the Act, 21 U.S.C. section 360bbb-3(b)(1), unless the authorization is terminated or revoked sooner.     Influenza A by PCR NEGATIVE NEGATIVE Final   Influenza B by PCR NEGATIVE NEGATIVE Final    Comment: (NOTE) The Xpert Xpress SARS-CoV-2/FLU/RSV assay is intended as an aid in  the diagnosis of influenza from Nasopharyngeal swab specimens  and  should not be used as a sole basis for treatment. Nasal washings and  aspirates are unacceptable for Xpert Xpress SARS-CoV-2/FLU/RSV  testing.  Fact Sheet for Patients: PinkCheek.be  Fact Sheet for Healthcare Providers: GravelBags.it  This test is not yet approved or cleared by the [place] FDA and  has been authorized for detection and/or diagnosis of SARS-CoV-2 by  FDA under an Emergency Use Authorization (EUA). This EUA will remain  in effect (meaning this test can be used) for the duration of the  Covid-19 declaration under Section 564(b)(1) of the Act, 21  U.S.C. section 360bbb-3(b)(1), unless the authorization is  terminated or revoked. Performed at [facility], [place] [address], [place], [place] [address]   Aerobic/Anaerobic Culture (surgical/deep wound)     Status: None (Preliminary result)   Collection Time: 04/13/20  3:51 PM   Specimen: Foot  Result Value Ref Range Status   Specimen Description FOOT LEFT  Final   Special Requests NONE  Final   Gram Stain   Final    RARE WBC PRESENT, PREDOMINANTLY PMN RARE GRAM POSITIVE COCCI Performed at [facility], [place] [address] [place], [place] [address]    Culture   Final    FEW STAPHYLOCOCCUS AUREUS NO ANAEROBES ISOLATED; CULTURE IN PROGRESS FOR 5 DAYS    Report Status PENDING  Incomplete   Organism ID, Bacteria STAPHYLOCOCCUS AUREUS  Final      Susceptibility   Staphylococcus aureus - MIC*    CIPROFLOXACIN <=0.5 SENSITIVE Sensitive     ERYTHROMYCIN <=0.25 SENSITIVE Sensitive     GENTAMICIN <=0.5 SENSITIVE Sensitive     OXACILLIN 0.5 SENSITIVE Sensitive     TETRACYCLINE <=1 SENSITIVE Sensitive     VANCOMYCIN <=0.5 SENSITIVE Sensitive     TRIMETH/SULFA <=10 SENSITIVE Sensitive     CLINDAMYCIN <=0.25 SENSITIVE Sensitive     RIFAMPIN <=0.5 SENSITIVE Sensitive     Inducible Clindamycin NEGATIVE Sensitive     * FEW STAPHYLOCOCCUS AUREUS   Culture, blood (routine x 2)     Status: None (Preliminary result)   Collection Time: 04/15/20  4:37 PM   Specimen: BLOOD LEFT HAND  Result Value Ref Range Status   Specimen Description BLOOD LEFT HAND  Final   Special Requests   Final    BOTTLES DRAWN AEROBIC AND ANAEROBIC Blood Culture adequate volume   Culture   Final    NO GROWTH < 24 HOURS Performed at [facility], [place] [address] [place], [place] [address]    Report Status PENDING  Incomplete  Culture, blood (routine x 2)     Status: None (Preliminary result)   Collection Time: 04/15/20  4:45 PM   Specimen: BLOOD RIGHT HAND  Result Value Ref Range Status   Specimen Description BLOOD RIGHT HAND  Final   Special Requests   Final    BOTTLES DRAWN AEROBIC AND ANAEROBIC Blood Culture adequate volume   Culture   Final    NO GROWTH < 24 HOURS Performed at [facility], [place] [address], [place], [place] [address]    Report Status PENDING  Incomplete    Sanjuan Dame, MD Internal Medicine, PGY-1 [phone]

## 2020-04-16 NOTE — Progress Notes (Signed)
°  Echocardiogram Echocardiogram Transesophageal has been performed.  [name] 04/16/2020, 11:35 AM

## 2020-04-16 NOTE — CV Procedure (Signed)
     TRANSESOPHAGEAL ECHOCARDIOGRAM   NAME:  Keith Garrett   MRN: [id number] DOB:  Apr 19, 1966   ADMIT DATE: 04/13/2020  INDICATIONS: Bacteremia  PROCEDURE:   Informed consent was obtained prior to the procedure. The risks, benefits and alternatives for the procedure were discussed and the patient comprehended these risks.  Risks include, but are not limited to, cough, sore throat, vomiting, nausea, somnolence, esophageal and stomach trauma or perforation, bleeding, low blood pressure, aspiration, pneumonia, infection, trauma to the teeth and death.    After a procedural time-out, the oropharynx was anesthetized and the patient was sedated by the anesthesia service. The transesophageal probe was inserted in the esophagus and stomach without difficulty and multiple views were obtained. Anesthesia was monitored by Elmer Sow, CRNA   COMPLICATIONS:    There were no immediate complications.  FINDINGS:  No vegetation seen  Oswaldo Milian MD [facility]  [address] [place], [place] [address] [phone]   11:31 AM

## 2020-04-16 NOTE — Consult Note (Signed)
Aortogram with runoff possible intervention by Dr Carlis Abbott tomorrow.    Preop orders written  Ruta Hinds, MD [facility] Office: [phone]

## 2020-04-16 NOTE — Progress Notes (Signed)
Family Medicine Teaching Service Daily Progress Note Intern Pager: [phone]  Patient name: Keith Garrett Medical record number: [id number] Date of birth: March 06, 1966 Age: 54 y.o. Gender: male  Primary Care Provider: Patient, No Pcp Per Consultants: Ortho, Vascular, Cardiology Code Status: Full  Pt Overview and Major Events to Date:  10/23 Admitted   Assessment and Plan:   Keith Garrett,Keith Garrett.PMHxis significant for Diabetes and Hypertension.   Left FootCellulitisw/ulceration andabscess formation, c/f underlying Garrett:Acute, improving. MRI L footon 10/23 consistent with cellulitis and patchy bone marrow edema within the calcaneal body/tuberosity Keith for possible reactive osteitisvsearly Garrett.  Was febrile over night with Tmax of 102.7, WBC is 18.2. Studies show noncompressable arteries in both legs and no DVT. Vascular consulted yesterday recommends angio on Wednesday 10/27. Echo shows grossly normal heart structures with EF of 65-70%. Will get TEE today. -Ortho recommendsextremity elevation with TIDdressing changes. -Continue IV Ancef 2g q8 (10/25-) -Follow wound culture(done at bedside in ED), few Staph aureus seen -Follow-up blood culture, few Staph aureus seen -Monitor CBC -Obtain TEE   HTN: BP 142/73 -Continue home Norvasc 10 mg -Hold home lisinopril40mg for now,if persistently elevated/higher can add back -Monitor BP   Type II DM: Did not require SSI overnight. A1c 6.7. Restarted COSOPT and Pred Forte eye drops. -Restarted Glipizide 5 mg daily -SSI with meals/bedtime as needed -CBGs with above -Restart COSOPT -Hold home Metformin   Normocytic anemia:Unclear chronicity, stable. Hgb this morning is 8.1.On admission Hgb was 8.5. Slight increase in immature reticulocyte  fraction, otherwise anemia panel still pending. No evidence of activeblood loss at this time. B12 started. -Continue B12 1000 mcg -Follow-up iron panel, B12, folate -Monitor CBC   Back Garrett,in setting of recent JYN:WGNFA, improving. Ambulation normal. No fractures on x-ray. -Tylenol as needed -K pad -Trial baclofen as needed for spasms   FEN/GI: NPO until TEE then resume Carb Modified PPx: Lovenox  Disposition: Med-Surg  Prior to Admission Living Arrangement: Home Anticipated Discharge Location: Home Barriers to Discharge: Surgical Procedure Anticipated discharge in approximately 3-6 day(s).   Subjective:  Interviewed patient at bedside.   He reports that he is doing well today. Discussed plan for TEE today and results of ABI and DVT US.   Objective: Temp:  [99 F (37.2 C)-100.9 F (38.3 C)] 99.2 F (37.3 C) (10/26 0447) Pulse Rate:  [88-94] 88 (10/26 0447) Resp:  [18-20] 20 (10/26 0447) BP: (142-161)/(67-81) 142/73 (10/26 0447) SpO2:  [93 %-100 %] 96 % (10/26 0447) Physical Exam:  Physical Exam Vitals and nursing note reviewed.  Constitutional:      General: He is not in acute distress.    Appearance: Normal appearance. He is obese. He is not ill-appearing, toxic-appearing or diaphoretic.  HENT:     Head: Normocephalic and atraumatic.  Cardiovascular:     Rate and Rhythm: Normal rate and regular rhythm.     Pulses: Normal pulses.          Radial pulses are 2+ on the right side and 2+ on the left side.     Heart sounds: Normal heart sounds, S1 normal and S2 normal. No murmur heard.   Pulmonary:     Effort: Pulmonary effort is normal. No respiratory distress.     Breath sounds: Normal breath sounds. No wheezing.  Abdominal:     General: There is no distension.     Palpations: There is no mass.     Tenderness: There is no  abdominal tenderness.  Musculoskeletal:     Right lower leg: No edema.     Left lower leg: No edema.  Neurological:     Mental  Status: He is alert. Mental status is at baseline.      Laboratory: Recent Labs  Lab 04/13/20 1236 04/14/20 0624 04/15/20 0346  WBC 24.7* 19.1* 18.2*  HGB 8.5* 8.2* 7.9*  HCT 26.5* 24.6* 23.7*  PLT 529* 564* 555*   Recent Labs  Lab 04/13/20 1236 04/14/20 0624  NA 133* 136  K 3.8 3.9  CL 102 106  CO2 21* 22  BUN 17 16  CREATININE 1.04 0.95  CALCIUM 8.1* 8.3*  PROT  --  6.7  BILITOT  --  0.9  ALKPHOS  --  159*  ALT  --  25  AST  --  27  GLUCOSE 82 107*   Lipid Panel     Component Value Date/Time   CHOL 122 04/16/2020 0049   TRIG 127 04/16/2020 0049   HDL 20 (L) 04/16/2020 0049   CHOLHDL 6.1 04/16/2020 0049   VLDL 25 04/16/2020 0049   LDLCALC 77 04/16/2020 0049    A1C: 6.7  Imaging/Diagnostic Tests:   Echo: EF 65-70%. Grade II diastolic dysfunction. Left atria moderately dilated. All other structures examined were grossly normal.   VAS [place] ABI W/WO TBI: Right: Resting right ankle-brachial index indicates noncompressible right lower extremity arteries.  Left: Resting left ankle-brachial index indicates noncompressible left lower extremity arteries.    VAS US DVT: RIGHT: No evidence of common femoral vein obstruction.  LEFT: There is no evidence of deep vein thrombosis in the lower extremity. No cystic structure found in the popliteal fossa.   MR Foot Left W WO Contrast: Superficial skin ulceration at the lateral aspect of the hindfoot at the level of the calcaneocuboid joint with associated edema within the underlying soft tissues, likely representing cellulitis. There is mild tenosynovitis of the traversing peroneal tendons. No organized or rim enhancing fluid collection near the ulcer base. Prominent patchy bone marrow edema within the calcaneal body and plantar aspect of the calcaneal tuberosity. There is patchy low to intermediate T1 signal within the calcaneal body and plantar aspect of the calcaneal tuberosity without definite cortical erosion. Findings  may be related to reactive osteitis although early acute Garrett is difficult to exclude. High-grade near complete tear of the central band of the plantar fascia from its proximal attachment on the calcaneal tuberosity. Small fluid collection along the medial margin of the mid second metatarsal diaphysis measuring 1.3 x 0.3 x 1.1 cm with peripheral rim enhancement, suggesting a small abscess. Diffuse edema-like signal throughout the intrinsic foot musculature which may reflect a nonspecific myositis.   DG Foot Complete Left:No acute findings. Extensive vascular calcifications consistent with underlying diabetes.   DG Chest 2 View 10/18:No active cardiopulmonary disease.   DG Lumbar Spine Complete 10/18:Mild degenerative changes as described above. No acute abnormality seen in the lumbar spine.   Briant Cedar, MD 04/16/2020, 6:13 AM PGY-1, [facility] Intern pager: [phone], text pages welcome

## 2020-04-16 NOTE — Anesthesia Preprocedure Evaluation (Signed)
Anesthesia Evaluation  Patient identified by MRN, date of birth, ID band Patient awake    Reviewed: Patient's Chart, lab work & pertinent test results  Airway Mallampati: II  TM Distance: >3 FB Neck ROM: Full    Dental  (+) Teeth Intact   Pulmonary neg pulmonary ROS,    Pulmonary exam normal        Cardiovascular hypertension, Pt. on medications  Rhythm:Regular Rate:Normal     Neuro/Psych negative neurological ROS  negative psych ROS   GI/Hepatic negative GI ROS, Neg liver ROS,   Endo/Other  diabetes, Well Controlled, Oral Hypoglycemic Agents  Renal/GU negative Renal ROS  negative genitourinary   Musculoskeletal  (+) Arthritis ,   Abdominal (+)  Abdomen: soft. Bowel sounds: normal.  Peds  Hematology   Anesthesia Other Findings   Reproductive/Obstetrics                             Anesthesia Physical Anesthesia Plan  ASA: II  Anesthesia Plan: MAC   Post-op Pain Management:    Induction:   PONV Risk Score and Plan: 1 and Propofol infusion and Treatment may vary due to age or medical condition  Airway Management Planned: Simple Face Mask and Nasal Cannula  Additional Equipment: None  Intra-op Plan:   Post-operative Plan:   Informed Consent: I have reviewed the patients History and Physical, chart, labs and discussed the procedure including the risks, benefits and alternatives for the proposed anesthesia with the patient or authorized representative who has indicated his/her understanding and acceptance.     Dental advisory given  Plan Discussed with: CRNA  Anesthesia Plan Comments: (Lab Results      Component                Value               Date                      NA                       134 (L)             04/16/2020                K                        3.9                 04/16/2020                CO2                      22                  04/16/2020                 GLUCOSE                  193 (H)             04/16/2020                BUN                      18  04/16/2020                CREATININE               1.06                04/16/2020                CALCIUM                  8.2 (L)             04/16/2020                GFRNONAA                 >60                 04/16/2020                GFRAA                                        09/21/2009            >60        The eGFR has been calculated using the MDRD equation. This calculation has not been validated in all clinical situations. eGFR's persistently <60 mL/min signify possible Chronic Kidney Disease.)        Anesthesia Quick Evaluation

## 2020-04-16 NOTE — Anesthesia Postprocedure Evaluation (Signed)
Anesthesia Post Note  Patient: Michale Emmerich  Procedure(s) Performed: TRANSESOPHAGEAL ECHOCARDIOGRAM (TEE) (N/A )     Patient location during evaluation: PACU Anesthesia Type: MAC Level of consciousness: awake and alert Pain management: pain level controlled Vital Signs Assessment: post-procedure vital signs reviewed and stable Respiratory status: spontaneous breathing, nonlabored ventilation, respiratory function stable and patient connected to nasal cannula oxygen Cardiovascular status: stable and blood pressure returned to baseline Postop Assessment: no apparent nausea or vomiting Anesthetic complications: no   No complications documented.  Last Vitals:  Vitals:   04/16/20 1155 04/16/20 1250  BP: 133/61 136/83  Pulse: 87 85  Resp: (!) 21 18  Temp:  37.2 C  SpO2: 100% 100%    Last Pain:  Vitals:   04/16/20 1250  TempSrc: Oral  PainSc:                  March Rummage Alondria Mousseau

## 2020-04-16 NOTE — Interval H&P Note (Signed)
History and Physical Interval Note:  04/16/2020 10:56 AM  Keith Garrett  has presented today for surgery, with the diagnosis of BACTEREMIA.  The various methods of treatment have been discussed with the patient and family. After consideration of risks, benefits and other options for treatment, the patient has consented to  Procedure(s): TRANSESOPHAGEAL ECHOCARDIOGRAM (TEE) (N/A) as a surgical intervention.  The patient's history has been reviewed, patient examined, no change in status, stable for surgery.  I have reviewed the patient's chart and labs.  Questions were answered to the patient's satisfaction.     [name]

## 2020-04-17 ENCOUNTER — Encounter (HOSPITAL_COMMUNITY): Payer: Self-pay | Admitting: Cardiology

## 2020-04-17 ENCOUNTER — Encounter (HOSPITAL_COMMUNITY)
Admission: EM | Disposition: A | Discharge status: Home Health | Payer: Self-pay | Source: Home / Self Care | Attending: Family Medicine

## 2020-04-17 DIAGNOSIS — D72829 Elevated white blood cell count, unspecified: Secondary | ICD-10-CM

## 2020-04-17 DIAGNOSIS — L02612 Cutaneous abscess of left foot: Secondary | ICD-10-CM

## 2020-04-17 DIAGNOSIS — R937 Abnormal findings on diagnostic imaging of other parts of musculoskeletal system: Secondary | ICD-10-CM

## 2020-04-17 DIAGNOSIS — R7881 Bacteremia: Secondary | ICD-10-CM

## 2020-04-17 DIAGNOSIS — L03116 Cellulitis of left lower limb: Principal | ICD-10-CM

## 2020-04-17 DIAGNOSIS — D649 Anemia, unspecified: Secondary | ICD-10-CM

## 2020-04-17 DIAGNOSIS — E11319 Type 2 diabetes mellitus with unspecified diabetic retinopathy without macular edema: Secondary | ICD-10-CM

## 2020-04-17 DIAGNOSIS — E8809 Other disorders of plasma-protein metabolism, not elsewhere classified: Secondary | ICD-10-CM

## 2020-04-17 HISTORY — PX: ABDOMINAL AORTOGRAM W/LOWER EXTREMITY: CATH118223

## 2020-04-17 LAB — BASIC METABOLIC PANEL
Anion gap: 8 (ref 5–15)
BUN: 19 mg/dL (ref 6–20)
CO2: 23 mmol/L (ref 22–32)
Calcium: 8.1 mg/dL — ABNORMAL LOW (ref 8.9–10.3)
Chloride: 104 mmol/L (ref 98–111)
Creatinine, Ser: 1.07 mg/dL (ref 0.61–1.24)
GFR, Estimated: >60 mL/min (ref >60)
Glucose, Bld: 117 mg/dL — ABNORMAL HIGH (ref 70–99)
Potassium: 3.9 mmol/L (ref 3.5–5.1)
Sodium: 135 mmol/L (ref 135–145)

## 2020-04-17 LAB — GLUCOSE, CAPILLARY
Glucose-Capillary: 186 mg/dL — ABNORMAL HIGH (ref 70–99)
Glucose-Capillary: 163 mg/dL — ABNORMAL HIGH (ref 70–99)
Glucose-Capillary: 122 mg/dL — ABNORMAL HIGH (ref 70–99)
Glucose-Capillary: 177 mg/dL — ABNORMAL HIGH (ref 70–99)
Glucose-Capillary: 191 mg/dL — ABNORMAL HIGH (ref 70–99)

## 2020-04-17 LAB — SURGICAL PCR SCREEN
MRSA, PCR: NEGATIVE
Staphylococcus aureus: NEGATIVE

## 2020-04-17 LAB — CBC
HCT: 21.6 % — ABNORMAL LOW (ref 39.0–52.0)
Hemoglobin: 7.2 g/dL — ABNORMAL LOW (ref 13.0–17.0)
MCH: 29.3 pg (ref 26.0–34.0)
MCHC: 33.3 g/dL (ref 30.0–36.0)
MCV: 87.8 fL (ref 80.0–100.0)
Platelets: 553 10*3/uL — ABNORMAL HIGH (ref 150–400)
RBC: 2.46 MIL/uL — ABNORMAL LOW (ref 4.22–5.81)
RDW: 12.9 % (ref 11.5–15.5)
WBC: 17 10*3/uL — ABNORMAL HIGH (ref 4.0–10.5)
nRBC: 0 % (ref 0.0–0.2)

## 2020-04-17 LAB — HEMOGLOBIN AND HEMATOCRIT, BLOOD
HCT: 21.7 % — ABNORMAL LOW (ref 39.0–52.0)
Hemoglobin: 7.2 g/dL — ABNORMAL LOW (ref 13.0–17.0)

## 2020-04-17 SURGERY — ABDOMINAL AORTOGRAM W/LOWER EXTREMITY
Anesthesia: LOCAL

## 2020-04-17 MED ORDER — IODIXANOL 320 MG/ML IV SOLN
INTRAVENOUS | Status: DC | PRN
  Administered 2020-04-17: 112 mL via INTRA_ARTERIAL

## 2020-04-17 MED ORDER — HEPARIN (PORCINE) IN NACL 1000-0.9 UT/500ML-% IV SOLN
INTRAVENOUS | Status: DC | PRN
  Administered 2020-04-17 (×2): 500 mL

## 2020-04-17 MED ORDER — SODIUM CHLORIDE 0.9 % IV SOLN: INTRAVENOUS | Status: DC

## 2020-04-17 MED ORDER — HEPARIN (PORCINE) IN NACL 1000-0.9 UT/500ML-% IV SOLN
INTRAVENOUS | Status: AC
  Filled 2020-04-17: qty 500

## 2020-04-17 MED ORDER — SODIUM CHLORIDE 0.9% FLUSH: 3.0000 mL | INTRAVENOUS | Status: DC | PRN

## 2020-04-17 MED ORDER — SODIUM CHLORIDE 0.9% FLUSH
3.0000 mL | Freq: Two times a day (BID) | INTRAVENOUS | Status: DC
  Administered 2020-04-18 – 2020-04-19 (×2): 3 mL via INTRAVENOUS

## 2020-04-17 MED ORDER — LIDOCAINE HCL (PF) 1 % IJ SOLN
INTRAMUSCULAR | Status: DC | PRN
  Administered 2020-04-17: 112 mL via INTRADERMAL

## 2020-04-17 MED ORDER — SODIUM CHLORIDE 0.9 % IV SOLN: 250.0000 mL | INTRAVENOUS | Status: DC | PRN

## 2020-04-17 MED ORDER — LABETALOL HCL 5 MG/ML IV SOLN: 10.0000 mg | INTRAVENOUS | Status: DC | PRN

## 2020-04-17 MED ORDER — HYDRALAZINE HCL 20 MG/ML IJ SOLN: 5.0000 mg | INTRAMUSCULAR | Status: DC | PRN

## 2020-04-17 MED ORDER — LIDOCAINE HCL (PF) 1 % IJ SOLN
INTRAMUSCULAR | Status: AC
  Filled 2020-04-17: qty 30

## 2020-04-17 MED ORDER — SODIUM CHLORIDE 0.9 % IV SOLN: INTRAVENOUS | Status: AC

## 2020-04-17 MED ORDER — ACETAMINOPHEN 325 MG PO TABS: 650.0000 mg | ORAL_TABLET | ORAL | Status: DC | PRN

## 2020-04-17 MED ORDER — ONDANSETRON HCL 4 MG/2ML IJ SOLN: 4.0000 mg | Freq: Four times a day (QID) | INTRAMUSCULAR | Status: DC | PRN

## 2020-04-17 SURGICAL SUPPLY — 10 items
CATH OMNI FLUSH 5F 65CM (CATHETERS) ×1 IMPLANT
DEVICE CLOSURE MYNXGRIP 5F (Vascular Products) ×1 IMPLANT
KIT MICROPUNCTURE NIT STIFF (SHEATH) ×1 IMPLANT
KIT PV (KITS) ×2 IMPLANT
SHEATH PINNACLE 5F 10CM (SHEATH) ×1 IMPLANT
SHEATH PROBE COVER 6X72 (BAG) ×1 IMPLANT
SYR MEDRAD MARK V 150ML (SYRINGE) ×1 IMPLANT
TRANSDUCER W/STOPCOCK (MISCELLANEOUS) ×2 IMPLANT
TRAY PV CATH (CUSTOM PROCEDURE TRAY) ×2 IMPLANT
WIRE BENTSON .035X145CM (WIRE) ×1 IMPLANT

## 2020-04-17 NOTE — Progress Notes (Addendum)
Family Medicine Teaching Service Daily Progress Note Intern Pager: [phone]  Patient name: Keith Garrett Medical record number: [id number] Date of birth: 04/14/1966 Age: 54 y.o. Gender: male  Primary Care Provider: Patient, No Pcp Per Consultants: Ortho, Vascular, Cardiology Code Status: Full  Pt Overview and Major Events to Date:  10/23 admitted 10/26 TEE 10/27 Angio  Assessment and Plan:  Keith Biehl-Rodriguezis a 54 y.o.malepresenting withleft foot swelling and pain,concerning for cellulitisand possible underlying osteomyelitis.PMHxis significant for Diabetes and Hypertension.   Left FootCellulitisw/ulceration andabscess formation, c/f underlying osteomyelitis:Acute, improving. MRI L footon 10/23 consistent with cellulitis and patchy bone marrow edema within the calcaneal body/tuberosity concerning for possible reactive osteitisvsearly osteomyelitis. WBC is 17.0, was afebrile overnight. Studies show noncompressable arteries in both legs and no DVT.TEE shows grossly normal heart structures with EF of 60-65%. Will get Angio today. -Ortho recommendsextremity elevation with TIDdressing changes. -Continue IV Ancef 2g q8 (10/25-) -Follow wound culture(done at bedside in ED), few Staph aureus seen -Follow-up blood culture,few Staph aureus seen -Monitor CBC   HTN: BP 162/87. On Norvasc and Lisinopril at home. Will assess Kidney Function tomorrow and may restart Lisinopril tomorrow for better BP control. -Continue home Norvasc 10 mg -Hold Lisinopril40mg  -Monitor BP   Type II DM: Did not require SSI overnight. A1c 6.7. Restarted COSOPT. -Continue Glipizide 5 mg daily -SSI with meals/bedtime as needed -CBGs with above -Continue COSOPT -Hold home Metformin   Normocytic anemia:Unclear chronicity, stable. Hgbthis morning is 7.2.On admission Hgb was 8.5.Slight increase in immature reticulocyte fraction, otherwise anemia panel still  pending. No evidence of activeblood loss at this time.B12 started. Will get repeat H&H this afternoon to trend. -Repeat H&H at 1200 -Continue B12 1000 mcg -Follow-up iron panel, B12, folate -Monitor CBC   Back pain,in setting of recent ZHG:DJMEQ, improving. Ambulation normal. No fractures on x-ray. -Tylenol as needed -K pad -Trial baclofen as needed for spasms   FEN/GI: NPO for Angio then resume Carb Modified PPx: Lovenox  Disposition: Med-Surg  Prior to Admission Living Arrangement: Home Anticipated Discharge Location: Home Barriers to Discharge: Continued Management Anticipated discharge in approximately 2-5 day(s).   Subjective:  Interviewed patient at bedside.  He reports feeling well. Denies any chest pain, SOB, reports his foot pain is improving. He reports still having some back pain. Discussed plan for Angio today and that Vascular and Ortho would decide next steps based on this result.  Objective: Temp:  [98.5 F (36.9 C)-100.1 F (37.8 C)] 98.6 F (37 C) (10/27 0430) Pulse Rate:  [70-89] 87 (10/27 0431) Resp:  [16-23] 20 (10/27 0515) BP: (132-177)/(59-87) 162/87 (10/27 0431) SpO2:  [95 %-100 %] 99 % (10/27 0431) Weight:  [213 lb 1.6 oz (96.7 kg)] 213 lb 1.6 oz (96.7 kg) (10/27 0515) Physical Exam:  Physical Exam Vitals and nursing note reviewed.  Constitutional:      General: He is not in acute distress.    Appearance: Normal appearance. He is obese. He is not ill-appearing, toxic-appearing or diaphoretic.  HENT:     Head: Normocephalic and atraumatic.  Cardiovascular:     Rate and Rhythm: Normal rate and regular rhythm.     Pulses: Normal pulses.          Radial pulses are 2+ on the right side and 2+ on the left side.     Heart sounds: Normal heart sounds, S1 normal and S2 normal.  Pulmonary:     Effort: Pulmonary effort is normal. No respiratory distress.     Breath sounds: Normal breath  sounds. No wheezing.  Abdominal:     General: There is  no distension.     Palpations: There is no mass.     Tenderness: There is no abdominal tenderness.  Musculoskeletal:     Right lower leg: No edema.     Left lower leg: No edema.  Neurological:     Mental Status: He is alert. Mental status is at baseline.  Psychiatric:        Mood and Affect: Mood normal.        Behavior: Behavior normal.        Thought Content: Thought content normal.      Laboratory: Recent Labs  Lab 04/15/20 0346 04/16/20 0920 04/17/20 0025  WBC 18.2* 18.1* 17.0*  HGB 7.9* 8.1* 7.2*  HCT 23.7* 24.3* 21.6*  PLT 555* 616* 553*   Recent Labs  Lab 04/14/20 0624 04/16/20 0920 04/17/20 0025  NA 136 134* 135  K 3.9 3.9 3.9  CL 106 103 104  CO2 22 22 23   BUN 16 18 19   CREATININE 0.95 1.06 1.07  CALCIUM 8.3* 8.2* 8.1*  PROT 6.7  --   --   BILITOT 0.9  --   --   ALKPHOS 159*  --   --   ALT 25  --   --   AST 27  --   --   GLUCOSE 107* 193* 117*     Imaging/Diagnostic Tests:   Echo TEE: EF 60-65%. No Left atrial Thrombus seen, no vegetation seen. All other structures examined grossly normal.   Echo: EF 65-70%. Grade II diastolic dysfunction. Left atria moderately dilated. All other structures examined were grossly normal.   VAS [place] ABI W/WO TBI: Right: Resting right ankle-brachial index indicates noncompressible right lower extremity arteries.  Left: Resting left ankle-brachial index indicates noncompressible left lower extremity arteries.    VAS US DVT: RIGHT: No evidence of common femoral vein obstruction.  LEFT: There is no evidence of deep vein thrombosis in the lower extremity. No cystic structure found in the popliteal fossa.   MR Foot Left W WO Contrast:Superficial skin ulceration at the lateral aspect of the hindfoot at the level of the calcaneocuboid joint with associated edema within the underlying soft tissues, likely representing cellulitis. There is mild tenosynovitis of the traversing peroneal tendons. No organized or rim  enhancing fluid collection near the ulcer base. Prominent patchy bone marrow edema within the calcaneal body and plantar aspect of the calcaneal tuberosity. There is patchy low to intermediate T1 signal within the calcaneal body and plantar aspect of the calcaneal tuberosity without definite cortical erosion. Findings may be related to reactive osteitis although early acute osteomyelitis is difficult to exclude. High-grade near complete tear of the central band of the plantar fascia from its proximal attachment on the calcaneal tuberosity. Small fluid collection along the medial margin of the mid secondmetatarsal diaphysis measuring 1.3 x 0.3 x 1.1 cm with peripheral rim enhancement, suggesting a small abscess. Diffuse edema-like signal throughout the intrinsic foot musculature which may reflect a nonspecific myositis.   DG Foot Complete Left:No acute findings. Extensive vascular calcifications consistent with underlying diabetes.   DG Chest 2 View 10/18:No active cardiopulmonary disease.   DG Lumbar Spine Complete 10/18:Mild degenerative changes as described above. No acute abnormality seen in the lumbar spine.   Briant Cedar, MD 04/17/2020, 7:19 AM PGY-1, [place] Intern pager: [phone], text pages welcome

## 2020-04-17 NOTE — Op Note (Signed)
    Patient name: Keith Garrett MRN: [id number] DOB: 08/09/1965 Sex: male  04/17/2020 Pre-operative Diagnosis: Left foot wound Post-operative diagnosis:  Same Surgeon:  Marty Heck, MD Procedure Performed: 1.  Ultrasound-guided access of right common femoral artery 2.  Aortogram including catheter selection of aorta 3.  Bilateral lower extremity arteriogram with runoff 4.  Mynx closure of right common femoral artery  Indications: 54 year old male that was seen in consultation earlier this week by Dr. Oneida Alar with a left foot wound.  Vascular surgery was asked to evaluate the patient.  He presents today for planned lower extremity arteriogram possible intervention after risk benefits discussed.  He was found to have non-compressible ABI's with a toe pressure of 52.     Findings:   Aortogram showed patent renal arteries bilaterally as well as a widely patent aortoiliac segment with no flow-limiting stenosis.  Left lower extremity arteriogram which was the side of interest showed a widely patent common femoral, profunda, SFA, above and below-knee popliteal artery and patent three-vessel runoff.  Dominant runoff in the left leg was the posterior tibial with filling of the plantar arch.  The anterior tibial appeared to occlude just distal to the ankle joint suggesting small vessel disease.  Right lower extremity showed a patent common femoral, profunda, SFA, above and below-knee popliteal artery three-vessel runoff.   Procedure:  The patient was identified in the holding area and taken to room 8.  The patient was then placed supine on the table and prepped and draped in the usual sterile fashion.  A time out was called.  Ultrasound was used to evaluate the right common femoral artery.  It was patent .  A digital ultrasound image was acquired.  A micropuncture needle was used to access the right common femoral artery under ultrasound guidance.  An 018 wire was advanced without  resistance and a micropuncture sheath was placed.  The 018 wire was removed and a benson wire was placed.  The micropuncture sheath was exchanged for a 5 french sheath.  An omniflush catheter was advanced over the wire to the level of L-1.  An abdominal angiogram was obtained.  Next catheter was pulled down and bilateral lower extremity runoff was obtained.  After we evaluated the images we did elect to cross the bifurcation in order to get a lateral left foot shot.  The left iliac was crossed with a Omni Flush catheter and a Bentson wire and the catheter was advanced into the left external iliac artery.  Pertinent findings are noted above.  Patient had no large vessel disease that needed intervention and has widely patent three-vessel runoff.  A mynx closure device was deployed in the right common femoral artery pressure was held for 5 minutes.  He was stable throughout the case.  Plan: Patent three vessel run-off left leg with no flow limiting stenosis.   Marty Heck, MD [facility] Office: [phone]

## 2020-04-17 NOTE — Progress Notes (Signed)
[facility] for Infectious Disease  Date of Admission:  04/13/2020   Total days of antibiotics 5        Day 3 cefazolin        S/p 2 days vancomycin, ceftriaxone         ASSESSMENT: Mr. Kabat is 54yo male with diabetes, hypertension admitted 10/23 for left foot cellulitis abscess with positive BCx and WCx for MSSA. Currently on day 3 cefazolin (total 5d antibiotics). TEE no vegetations. R/p BCx 10/25 no growth to date. Most likely has acute osteomyelitis, will plan to treat 2 weeks IV w/ 4 weeks po. Per vascular, will undergo angio today.  PLAN: 1. C/w cefazolin 2g q8 2. Monitor back pain, low threshold for MRI if changes occur 3. Angio today per vascular 4. 2 weeks IV Ancef then will transition to po for four more weeks 5. Will plan to follow-up in ID clinic  Principal Problem:   Foot ulcer, left, limited to breakdown of skin (HCC) Active Problems:   Cellulitis of foot, left   Reactive thrombocytosis   Normocytic anemia   Hypoalbuminemia   Degenerative joint disease (DJD) of lumbar spine   Bone marrow edema of left calcaneus   Tenosynovitis, mild, of the traversing left peroneal tendons.   Traumatic rupture of plantar fascia of left foot   Foot abscess, left, posssible   Myositis of left foot   PVD (peripheral vascular disease) (HCC)   Severe protein-calorie malnutrition (HCC)   Diabetic polyneuropathy associated with type 2 diabetes mellitus (HCC)   Bacteremia   Scheduled Meds: . amLODipine  10 mg Oral Daily  . aspirin  81 mg Oral Daily  . atorvastatin  40 mg Oral Daily  . dorzolamide-timolol  1 drop Left Eye BID  . enoxaparin (LOVENOX) injection  40 mg Subcutaneous Q24H  . ferrous sulfate  325 mg Oral Daily  . glipiZIDE  5 mg Oral Q breakfast  . insulin aspart  0-5 Units Subcutaneous QHS  . insulin aspart  0-9 Units Subcutaneous TID WC  . sodium chloride flush  3 mL Intravenous Q12H  . vitamin B-12  1,000 mcg Oral Daily   Continuous  Infusions: . sodium chloride    . sodium chloride    .  ceFAZolin (ANCEF) IV 2 g (04/17/20 0847)   PRN Meds:.sodium chloride, acetaminophen **OR** acetaminophen, baclofen, polyethylene glycol, sodium chloride flush   SUBJECTIVE: Patient doing well this morning, no complaints. Pain controlled. No overnight events.  Review of Systems: Other symptoms negative.  No Known Allergies  OBJECTIVE: Vitals:   04/17/20 0430 04/17/20 0431 04/17/20 0515 04/17/20 0810  BP: (!) 162/87 (!) 162/87  (!) 159/80  Pulse: 87 87  87  Resp: 18 17 20 18   Temp: 98.6 F (37 C)   98.9 F (37.2 C)  TempSrc: Oral   Oral  SpO2: 98% 99%  98%  Weight:   96.7 kg   Height:       Body mass index is 38.98 kg/m.  Physical Exam: General: Pleasant, no acute distress HENT: Normocephalic, atraumatic Eyes: anicteric CV: Regular rate, rhythm. No m/r/g Pulm: No increased WOB Neuro: Awake, alert, oriented x3  Lab Results Lab Results  Component Value Date   WBC 17.0 (H) 04/17/2020   HGB 7.2 (L) 04/17/2020   HCT 21.6 (L) 04/17/2020   MCV 87.8 04/17/2020   PLT 553 (H) 04/17/2020    Lab Results  Component Value Date   CREATININE 1.07 04/17/2020  BUN 19 04/17/2020   NA 135 04/17/2020   K 3.9 04/17/2020   CL 104 04/17/2020   CO2 23 04/17/2020    Lab Results  Component Value Date   ALT 25 04/14/2020   AST 27 04/14/2020   ALKPHOS 159 (H) 04/14/2020   BILITOT 0.9 04/14/2020     Microbiology: Recent Results (from the past 240 hour(s))  Blood culture (routine x 2)     Status: None (Preliminary result)   Collection Time: 04/13/20  2:05 PM   Specimen: BLOOD  Result Value Ref Range Status   Specimen Description BLOOD RIGHT ANTECUBITAL  Final   Special Requests   Final    BOTTLES DRAWN AEROBIC AND ANAEROBIC Blood Culture adequate volume   Culture   Final    NO GROWTH 4 DAYS Performed at [facility], [place] [address] [place], [place] [address]    Report Status PENDING  Incomplete  Blood  culture (routine x 2)     Status: Abnormal   Collection Time: 04/13/20  2:15 PM   Specimen: BLOOD  Result Value Ref Range Status   Specimen Description BLOOD LEFT ANTECUBITAL  Final   Special Requests   Final    BOTTLES DRAWN AEROBIC AND ANAEROBIC Blood Culture results may not be optimal due to an excessive volume of blood received in culture bottles   Culture  Setup Time   Final    GRAM POSITIVE COCCI IN CLUSTERS AEROBIC BOTTLE ONLY CRITICAL RESULT CALLED TO, READ BACK BY AND VERIFIED WITH: G. ABBOTT,PHARMD 0100 04/15/2020 T. TYSOR Performed at [facility], [place] [address], [place], [place] [address]    Culture STAPHYLOCOCCUS AUREUS (A)  Final   Report Status 04/16/2020 FINAL  Final   Organism ID, Bacteria STAPHYLOCOCCUS AUREUS  Final      Susceptibility   Staphylococcus aureus - MIC*    CIPROFLOXACIN <=0.5 SENSITIVE Sensitive     ERYTHROMYCIN <=0.25 SENSITIVE Sensitive     GENTAMICIN <=0.5 SENSITIVE Sensitive     OXACILLIN 0.5 SENSITIVE Sensitive     TETRACYCLINE <=1 SENSITIVE Sensitive     VANCOMYCIN 1 SENSITIVE Sensitive     TRIMETH/SULFA <=10 SENSITIVE Sensitive     CLINDAMYCIN <=0.25 SENSITIVE Sensitive     RIFAMPIN <=0.5 SENSITIVE Sensitive     Inducible Clindamycin NEGATIVE Sensitive     * STAPHYLOCOCCUS AUREUS  Blood Culture ID Panel (Reflexed)     Status: Abnormal   Collection Time: 04/13/20  2:15 PM  Result Value Ref Range Status   Enterococcus faecalis NOT DETECTED NOT DETECTED Final   Enterococcus Faecium NOT DETECTED NOT DETECTED Final   Listeria monocytogenes NOT DETECTED NOT DETECTED Final   Staphylococcus species DETECTED (A) NOT DETECTED Final    Comment: CRITICAL RESULT CALLED TO, READ BACK BY AND VERIFIED WITH: G. ABBOTT,PHARMD 0100 04/15/2020 T. TYSOR    Staphylococcus aureus (BCID) DETECTED (A) NOT DETECTED Final    Comment: CRITICAL RESULT CALLED TO, READ BACK BY AND VERIFIED WITH: G. ABBOTT,PHARMD 0100 04/15/2020 T. TYSOR    Staphylococcus  epidermidis NOT DETECTED NOT DETECTED Final   Staphylococcus lugdunensis NOT DETECTED NOT DETECTED Final   Streptococcus species NOT DETECTED NOT DETECTED Final   Streptococcus agalactiae NOT DETECTED NOT DETECTED Final   Streptococcus pneumoniae NOT DETECTED NOT DETECTED Final   Streptococcus pyogenes NOT DETECTED NOT DETECTED Final   A.calcoaceticus-baumannii NOT DETECTED NOT DETECTED Final   Bacteroides fragilis NOT DETECTED NOT DETECTED Final   Enterobacterales NOT DETECTED NOT DETECTED Final  Enterobacter cloacae complex NOT DETECTED NOT DETECTED Final   Escherichia coli NOT DETECTED NOT DETECTED Final   Klebsiella aerogenes NOT DETECTED NOT DETECTED Final   Klebsiella oxytoca NOT DETECTED NOT DETECTED Final   Klebsiella pneumoniae NOT DETECTED NOT DETECTED Final   Proteus species NOT DETECTED NOT DETECTED Final   Salmonella species NOT DETECTED NOT DETECTED Final   Serratia marcescens NOT DETECTED NOT DETECTED Final   Haemophilus influenzae NOT DETECTED NOT DETECTED Final   Neisseria meningitidis NOT DETECTED NOT DETECTED Final   Pseudomonas aeruginosa NOT DETECTED NOT DETECTED Final   Stenotrophomonas maltophilia NOT DETECTED NOT DETECTED Final   Candida albicans NOT DETECTED NOT DETECTED Final   Candida auris NOT DETECTED NOT DETECTED Final   Candida glabrata NOT DETECTED NOT DETECTED Final   Candida krusei NOT DETECTED NOT DETECTED Final   Candida parapsilosis NOT DETECTED NOT DETECTED Final   Candida tropicalis NOT DETECTED NOT DETECTED Final   Cryptococcus neoformans/gattii NOT DETECTED NOT DETECTED Final   Meth resistant mecA/C and MREJ NOT DETECTED NOT DETECTED Final    Comment: Performed at [facility], [place] [address], [place], [place] [address]  Respiratory Panel by RT PCR (Flu A&B, Covid) - Nasopharyngeal Swab     Status: None   Collection Time: 04/13/20  3:51 PM   Specimen: Nasopharyngeal Swab  Result Value Ref Range Status   SARS Coronavirus 2 by RT PCR  NEGATIVE NEGATIVE Final    Comment: (NOTE) SARS-CoV-2 target nucleic acids are NOT DETECTED.  The SARS-CoV-2 RNA is generally detectable in upper respiratoy specimens during the acute phase of infection. The lowest concentration of SARS-CoV-2 viral copies this assay can detect is 131 copies/mL. A negative result does not preclude SARS-Cov-2 infection and should not be used as the sole basis for treatment or other patient management decisions. A negative result may occur with  improper specimen collection/handling, submission of specimen other than nasopharyngeal swab, presence of viral mutation(s) within the areas targeted by this assay, and inadequate number of viral copies (<131 copies/mL). A negative result must be combined with clinical observations, patient history, and epidemiological information. The expected result is Negative.  Fact Sheet for Patients:  PinkCheek.be  Fact Sheet for Healthcare Providers:  GravelBags.it  This test is no t yet approved or cleared by the [place] FDA and  has been authorized for detection and/or diagnosis of SARS-CoV-2 by FDA under an Emergency Use Authorization (EUA). This EUA will remain  in effect (meaning this test can be used) for the duration of the COVID-19 declaration under Section 564(b)(1) of the Act, 21 U.S.C. section 360bbb-3(b)(1), unless the authorization is terminated or revoked sooner.     Influenza A by PCR NEGATIVE NEGATIVE Final   Influenza B by PCR NEGATIVE NEGATIVE Final    Comment: (NOTE) The Xpert Xpress SARS-CoV-2/FLU/RSV assay is intended as an aid in  the diagnosis of influenza from Nasopharyngeal swab specimens and  should not be used as a sole basis for treatment. Nasal washings and  aspirates are unacceptable for Xpert Xpress SARS-CoV-2/FLU/RSV  testing.  Fact Sheet for Patients: PinkCheek.be  Fact Sheet for  Healthcare Providers: GravelBags.it  This test is not yet approved or cleared by the [place] FDA and  has been authorized for detection and/or diagnosis of SARS-CoV-2 by  FDA under an Emergency Use Authorization (EUA). This EUA will remain  in effect (meaning this test can be used) for the duration of the  Covid-19 declaration under Section 564(b)(1) of  the Act, 21  U.S.C. section 360bbb-3(b)(1), unless the authorization is  terminated or revoked. Performed at [facility], [place] [address], [place], [place] [address]   Aerobic/Anaerobic Culture (surgical/deep wound)     Status: None (Preliminary result)   Collection Time: 04/13/20  3:51 PM   Specimen: Foot  Result Value Ref Range Status   Specimen Description FOOT LEFT  Final   Special Requests NONE  Final   Gram Stain   Final    RARE WBC PRESENT, PREDOMINANTLY PMN RARE GRAM POSITIVE COCCI    Culture   Final    FEW STAPHYLOCOCCUS AUREUS NO ANAEROBES ISOLATED; CULTURE IN PROGRESS FOR 5 DAYS    Report Status PENDING  Incomplete   Organism ID, Bacteria STAPHYLOCOCCUS AUREUS  Final      Susceptibility   Staphylococcus aureus - MIC*    CIPROFLOXACIN <=0.5 SENSITIVE Sensitive     ERYTHROMYCIN <=0.25 SENSITIVE Sensitive     GENTAMICIN <=0.5 SENSITIVE Sensitive     OXACILLIN 0.5 SENSITIVE Sensitive     TETRACYCLINE <=1 SENSITIVE Sensitive     VANCOMYCIN <=0.5 SENSITIVE Sensitive     TRIMETH/SULFA <=10 SENSITIVE Sensitive     CLINDAMYCIN <=0.25 SENSITIVE Sensitive     RIFAMPIN <=0.5 SENSITIVE Sensitive     Inducible Clindamycin Value in next row Sensitive      NEGATIVEPerformed at [place] [address] [place], [place] [address]    * FEW STAPHYLOCOCCUS AUREUS  Culture, blood (routine x 2)     Status: None (Preliminary result)   Collection Time: 04/15/20  4:37 PM   Specimen: BLOOD LEFT HAND  Result Value Ref Range Status   Specimen Description BLOOD LEFT HAND  Final   Special  Requests   Final    BOTTLES DRAWN AEROBIC AND ANAEROBIC Blood Culture adequate volume   Culture   Final    NO GROWTH 2 DAYS Performed at [facility], [place] [address], [place], [place] [address]    Report Status PENDING  Incomplete  Culture, blood (routine x 2)     Status: None (Preliminary result)   Collection Time: 04/15/20  4:45 PM   Specimen: BLOOD RIGHT HAND  Result Value Ref Range Status   Specimen Description BLOOD RIGHT HAND  Final   Special Requests   Final    BOTTLES DRAWN AEROBIC AND ANAEROBIC Blood Culture adequate volume   Culture   Final    NO GROWTH 2 DAYS Performed at [facility], [place] [address] [place], [place] [address]    Report Status PENDING  Incomplete  Surgical PCR screen     Status: None   Collection Time: 04/17/20  5:04 AM   Specimen: Nasal Mucosa; Nasal Swab  Result Value Ref Range Status   MRSA, PCR NEGATIVE NEGATIVE Final   Staphylococcus aureus NEGATIVE NEGATIVE Final    Comment: (NOTE) The Xpert SA Assay (FDA approved for NASAL specimens in patients 27 years of age and older), is one component of a comprehensive surveillance program. It is not intended to diagnose infection nor to guide or monitor treatment. Performed at [facility], [place] [address], [place],  [address]     Sanjuan Dame, MD Internal Medicine PGY-1 [phone]

## 2020-04-17 NOTE — Progress Notes (Signed)
Angio today by Dr Tyron Russell, MD [facility] Office: [phone]
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
## 2020-04-17 NOTE — Progress Notes (Signed)
Arteriogram reviewed.  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 Large vessels open for the most part.   Perfusion to foot is as good as it will get.  Pt can follow up with Dr Sharol Given if he needs debridement, etc  Will sign off  Ruta Hinds, MD [facility] Office: [phone]

## 2020-04-18 ENCOUNTER — Inpatient Hospital Stay: Payer: Self-pay

## 2020-04-18 DIAGNOSIS — R809 Proteinuria, unspecified: Secondary | ICD-10-CM | POA: Diagnosis present

## 2020-04-18 LAB — GLUCOSE, CAPILLARY
Glucose-Capillary: 166 mg/dL — ABNORMAL HIGH (ref 70–99)
Glucose-Capillary: 235 mg/dL — ABNORMAL HIGH (ref 70–99)
Glucose-Capillary: 220 mg/dL — ABNORMAL HIGH (ref 70–99)
Glucose-Capillary: 161 mg/dL — ABNORMAL HIGH (ref 70–99)

## 2020-04-18 LAB — BASIC METABOLIC PANEL
Anion gap: 6 (ref 5–15)
BUN: 17 mg/dL (ref 6–20)
CO2: 23 mmol/L (ref 22–32)
Calcium: 7.6 mg/dL — ABNORMAL LOW (ref 8.9–10.3)
Chloride: 104 mmol/L (ref 98–111)
Creatinine, Ser: 1.19 mg/dL (ref 0.61–1.24)
GFR, Estimated: >60 mL/min (ref >60)
Glucose, Bld: 184 mg/dL — ABNORMAL HIGH (ref 70–99)
Potassium: 3.7 mmol/L (ref 3.5–5.1)
Sodium: 133 mmol/L — ABNORMAL LOW (ref 135–145)

## 2020-04-18 LAB — CBC
HCT: 21.5 % — ABNORMAL LOW (ref 39.0–52.0)
Hemoglobin: 7.1 g/dL — ABNORMAL LOW (ref 13.0–17.0)
MCH: 29 pg (ref 26.0–34.0)
MCHC: 33 g/dL (ref 30.0–36.0)
MCV: 87.8 fL (ref 80.0–100.0)
Platelets: 545 10*3/uL — ABNORMAL HIGH (ref 150–400)
RBC: 2.45 MIL/uL — ABNORMAL LOW (ref 4.22–5.81)
RDW: 12.7 % (ref 11.5–15.5)
WBC: 15.4 10*3/uL — ABNORMAL HIGH (ref 4.0–10.5)
nRBC: 0 % (ref 0.0–0.2)

## 2020-04-18 LAB — AEROBIC/ANAEROBIC CULTURE W GRAM STAIN (SURGICAL/DEEP WOUND)

## 2020-04-18 LAB — CULTURE, BLOOD (ROUTINE X 2)
Culture: NO GROWTH
Special Requests: ADEQUATE

## 2020-04-18 MED ORDER — CHLORHEXIDINE GLUCONATE CLOTH 2 % EX PADS
6.0000 | MEDICATED_PAD | Freq: Every day | CUTANEOUS | Status: DC
  Administered 2020-04-18: 6 via TOPICAL

## 2020-04-18 MED ORDER — CEFAZOLIN IV (FOR PTA / DISCHARGE USE ONLY): 2.0000 g | Freq: Three times a day (TID) | INTRAVENOUS | 0 refills | Status: AC

## 2020-04-18 MED ORDER — SODIUM CHLORIDE 0.9% FLUSH: 10.0000 mL | INTRAVENOUS | Status: DC | PRN

## 2020-04-18 MED ORDER — LISINOPRIL 40 MG PO TABS
40.0000 mg | ORAL_TABLET | Freq: Every day | ORAL | Status: DC
  Administered 2020-04-18: 40 mg via ORAL
  Filled 2020-04-18: qty 1
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
## 2020-04-18 NOTE — Hospital Course (Addendum)
Follow up   - recommend colonoscopy and/or EGD    Per ortho: arteriogram 10/28 with small vessel disease in the left foot.  The lateral ulcer over the cuboid left foot was ischemic but without cellulitis, odor or drainage.  Recommended follow-up in the office in 1 week and evaluate for continued conservative therapy versus surgical intervention

## 2020-04-18 NOTE — Progress Notes (Signed)
Patient ID: Keith Garrett, male   DOB: Jul 19, 1965, 54 y.o.   MRN: [id number] Patient is status post arteriogram studies yesterday with small vessel disease in the left foot.  The lateral ulcer over the cuboid left foot is ischemic but no cellulitis no odor no drainage.  I will plan to follow-up in the office in 1 week and evaluate for continued conservative therapy versus surgical intervention.

## 2020-04-18 NOTE — Progress Notes (Signed)
PHARMACY CONSULT NOTE FOR:  OUTPATIENT  PARENTERAL ANTIBIOTIC THERAPY (OPAT)  Indication: acute osteomyelitis Regimen: Cefazolin 2g IV q8hours End date: 05/10/20  IV antibiotic discharge orders are pended. To discharging provider:  please sign these orders via discharge navigator,  Select New Orders & click on the button choice - Manage This Unsigned Work.     Thank you for allowing pharmacy to be a part of this patient's care.  Dimple Nanas, PharmD PGY-1 Acute Care Pharmacy Resident Office: [phone] 04/18/2020 12:53 PM

## 2020-04-18 NOTE — Progress Notes (Signed)
[facility] for Infectious Disease  Date of Admission:  04/13/2020   Total days of antibiotics 6        Day 4 cefazolin        S/p 2 days vancomycin, ceftriaxone         ASSESSMENT: [name] is 54yo male with diabetes, hypertension admitted 10/23 for left foot cellulitis abscess with positive BCx and WCx for MSSA. Currently on day 3 cefazolin (total 5d antibiotics). TEE no vegetations. R/p BCx 10/25 no growth to date. Angio yesterday, revealed small vessel disease, large vessels patent. Most likely has acute osteomyelitis, recommend 4 weeks IV Ancef and follow-up with ID outpatient clinic.  PLAN: 1. C/w cefazolin 2g q8 2. Monitor back pain, low threshold for MRI if changes occur 3. Will need PICC for four total weeks antibiotics 5. Will plan to follow-up in ID clinic  Principal Problem:   Foot ulcer, left, limited to breakdown of skin (HCC) Active Problems:   Cellulitis of foot, left   Reactive thrombocytosis   Normocytic anemia   Hypoalbuminemia   Degenerative joint disease (DJD) of lumbar spine   Bone marrow edema of left calcaneus   Tenosynovitis, mild, of the traversing left peroneal tendons.   Traumatic rupture of plantar fascia of left foot   Foot abscess, left, posssible   Myositis of left foot   PVD (peripheral vascular disease) (HCC)   Severe protein-calorie malnutrition (HCC)   Diabetic polyneuropathy associated with type 2 diabetes mellitus (HCC)   Bacteremia   Scheduled Meds: . amLODipine  10 mg Oral Daily  . aspirin  81 mg Oral Daily  . atorvastatin  40 mg Oral Daily  . dorzolamide-timolol  1 drop Left Eye BID  . enoxaparin (LOVENOX) injection  40 mg Subcutaneous Q24H  . ferrous sulfate  325 mg Oral Daily  . glipiZIDE  5 mg Oral Q breakfast  . insulin aspart  0-5 Units Subcutaneous QHS  . insulin aspart  0-9 Units Subcutaneous TID WC  . sodium chloride flush  3 mL Intravenous Q12H  . sodium chloride flush  3 mL Intravenous Q12H   . vitamin B-12  1,000 mcg Oral Daily   Continuous Infusions: . sodium chloride    . sodium chloride 100 mL/hr at 04/17/20 2312  . sodium chloride    .  ceFAZolin (ANCEF) IV 2 g (04/18/20 0853)   PRN Meds:.sodium chloride, sodium chloride, acetaminophen, baclofen, ondansetron (ZOFRAN) IV, polyethylene glycol, sodium chloride flush, sodium chloride flush   SUBJECTIVE: No acute events overnight. Patient doing well this morning, pain controlled.  Review of Systems: Other symptoms negative.  No Known Allergies  OBJECTIVE: Vitals:   04/17/20 1959 04/17/20 2309 04/18/20 0822 04/18/20 1145  BP: (!) 167/80 (!) 153/78 (!) 159/81 139/62  Pulse: 85 84 82 80  Resp: (!) 21 16 18 20   Temp:  99.3 F (37.4 C) 97.9 F (36.6 C) 99 F (37.2 C)  TempSrc:  Oral Oral Oral  SpO2: 98% 96% 100% 99%  Weight:      Height:       Body mass index is 38.98 kg/m.  Physical Exam: General: Pleasant, no acute distress HENT: Normocephalic, atraumatic Eyes: anicteric Pulm: No increased WOB Neuro: Awake, alert, oriented x3 Skin: Left foot bandaged, no surrounding erythema  Lab Results Lab Results  Component Value Date   WBC 15.4 (H) 04/18/2020   HGB 7.1 (L) 04/18/2020   HCT 21.5 (L) 04/18/2020   MCV 87.8 04/18/2020  PLT 545 (H) 04/18/2020    Lab Results  Component Value Date   CREATININE 1.19 04/18/2020   BUN 17 04/18/2020   NA 133 (L) 04/18/2020   K 3.7 04/18/2020   CL 104 04/18/2020   CO2 23 04/18/2020    Lab Results  Component Value Date   ALT 25 04/14/2020   AST 27 04/14/2020   ALKPHOS 159 (H) 04/14/2020   BILITOT 0.9 04/14/2020     Microbiology: Recent Results (from the past 240 hour(s))  Blood culture (routine x 2)     Status: None   Collection Time: 04/13/20  2:05 PM   Specimen: BLOOD  Result Value Ref Range Status   Specimen Description BLOOD RIGHT ANTECUBITAL  Final   Special Requests   Final    BOTTLES DRAWN AEROBIC AND ANAEROBIC Blood Culture adequate volume    Culture   Final    NO GROWTH 5 DAYS Performed at [facility], [address] [place], [place] [address]    Report Status 04/18/2020 FINAL  Final  Blood culture (routine x 2)     Status: Abnormal   Collection Time: 04/13/20  2:15 PM   Specimen: BLOOD  Result Value Ref Range Status   Specimen Description BLOOD LEFT ANTECUBITAL  Final   Special Requests   Final    BOTTLES DRAWN AEROBIC AND ANAEROBIC Blood Culture results may not be optimal due to an excessive volume of blood received in culture bottles   Culture  Setup Time   Final    GRAM POSITIVE COCCI IN CLUSTERS AEROBIC BOTTLE ONLY CRITICAL RESULT CALLED TO, READ BACK BY AND VERIFIED WITH: G. ABBOTT,PHARMD 0100 04/15/2020 T. TYSOR Performed at [facility], [place] [address], [place],  [address]    Culture STAPHYLOCOCCUS AUREUS (A)  Final   Report Status 04/16/2020 FINAL  Final   Organism ID, Bacteria STAPHYLOCOCCUS AUREUS  Final      Susceptibility   Staphylococcus aureus - MIC*    CIPROFLOXACIN <=0.5 SENSITIVE Sensitive     ERYTHROMYCIN <=0.25 SENSITIVE Sensitive     GENTAMICIN <=0.5 SENSITIVE Sensitive     OXACILLIN 0.5 SENSITIVE Sensitive     TETRACYCLINE <=1 SENSITIVE Sensitive     VANCOMYCIN 1 SENSITIVE Sensitive     TRIMETH/SULFA <=10 SENSITIVE Sensitive     CLINDAMYCIN <=0.25 SENSITIVE Sensitive     RIFAMPIN <=0.5 SENSITIVE Sensitive     Inducible Clindamycin NEGATIVE Sensitive     * STAPHYLOCOCCUS AUREUS  Blood Culture ID Panel (Reflexed)     Status: Abnormal   Collection Time: 04/13/20  2:15 PM  Result Value Ref Range Status   Enterococcus faecalis NOT DETECTED NOT DETECTED Final   Enterococcus Faecium NOT DETECTED NOT DETECTED Final   Listeria monocytogenes NOT DETECTED NOT DETECTED Final   Staphylococcus species DETECTED (A) NOT DETECTED Final    Comment: CRITICAL RESULT CALLED TO, READ BACK BY AND VERIFIED WITH: G. ABBOTT,PHARMD 0100 04/15/2020 T. TYSOR    Staphylococcus aureus (BCID)  DETECTED (A) NOT DETECTED Final    Comment: CRITICAL RESULT CALLED TO, READ BACK BY AND VERIFIED WITH: G. ABBOTT,PHARMD 0100 04/15/2020 T. TYSOR    Staphylococcus epidermidis NOT DETECTED NOT DETECTED Final   Staphylococcus lugdunensis NOT DETECTED NOT DETECTED Final   Streptococcus species NOT DETECTED NOT DETECTED Final   Streptococcus agalactiae NOT DETECTED NOT DETECTED Final   Streptococcus pneumoniae NOT DETECTED NOT DETECTED Final   Streptococcus pyogenes NOT DETECTED NOT DETECTED Final   A.calcoaceticus-baumannii NOT DETECTED NOT  DETECTED Final   Bacteroides fragilis NOT DETECTED NOT DETECTED Final   Enterobacterales NOT DETECTED NOT DETECTED Final   Enterobacter cloacae complex NOT DETECTED NOT DETECTED Final   Escherichia coli NOT DETECTED NOT DETECTED Final   Klebsiella aerogenes NOT DETECTED NOT DETECTED Final   Klebsiella oxytoca NOT DETECTED NOT DETECTED Final   Klebsiella pneumoniae NOT DETECTED NOT DETECTED Final   Proteus species NOT DETECTED NOT DETECTED Final   Salmonella species NOT DETECTED NOT DETECTED Final   Serratia marcescens NOT DETECTED NOT DETECTED Final   Haemophilus influenzae NOT DETECTED NOT DETECTED Final   Neisseria meningitidis NOT DETECTED NOT DETECTED Final   Pseudomonas aeruginosa NOT DETECTED NOT DETECTED Final   Stenotrophomonas maltophilia NOT DETECTED NOT DETECTED Final   Candida albicans NOT DETECTED NOT DETECTED Final   Candida auris NOT DETECTED NOT DETECTED Final   Candida glabrata NOT DETECTED NOT DETECTED Final   Candida krusei NOT DETECTED NOT DETECTED Final   Candida parapsilosis NOT DETECTED NOT DETECTED Final   Candida tropicalis NOT DETECTED NOT DETECTED Final   Cryptococcus neoformans/gattii NOT DETECTED NOT DETECTED Final   Meth resistant mecA/C and MREJ NOT DETECTED NOT DETECTED Final    Comment: Performed at [facility], [address], [place], [place] [address]  Respiratory Panel by RT PCR (Flu A&B, Covid) -  Nasopharyngeal Swab     Status: None   Collection Time: 04/13/20  3:51 PM   Specimen: Nasopharyngeal Swab  Result Value Ref Range Status   SARS Coronavirus 2 by RT PCR NEGATIVE NEGATIVE Final    Comment: (NOTE) SARS-CoV-2 target nucleic acids are NOT DETECTED.  The SARS-CoV-2 RNA is generally detectable in upper respiratoy specimens during the acute phase of infection. The lowest concentration of SARS-CoV-2 viral copies this assay can detect is 131 copies/mL. A negative result does not preclude SARS-Cov-2 infection and should not be used as the sole basis for treatment or other patient management decisions. A negative result may occur with  improper specimen collection/handling, submission of specimen other than nasopharyngeal swab, presence of viral mutation(s) within the areas targeted by this assay, and inadequate number of viral copies (<131 copies/mL). A negative result must be combined with clinical observations, patient history, and epidemiological information. The expected result is Negative.  Fact Sheet for Patients:  PinkCheek.be  Fact Sheet for Healthcare Providers:  GravelBags.it  This test is no t yet approved or cleared by the [place] FDA and  has been authorized for detection and/or diagnosis of SARS-CoV-2 by FDA under an Emergency Use Authorization (EUA). This EUA will remain  in effect (meaning this test can be used) for the duration of the COVID-19 declaration under Section 564(b)(1) of the Act, 21 U.S.C. section 360bbb-3(b)(1), unless the authorization is terminated or revoked sooner.     Influenza A by PCR NEGATIVE NEGATIVE Final   Influenza B by PCR NEGATIVE NEGATIVE Final    Comment: (NOTE) The Xpert Xpress SARS-CoV-2/FLU/RSV assay is intended as an aid in  the diagnosis of influenza from Nasopharyngeal swab specimens and  should not be used as a sole basis for treatment. Nasal washings and   aspirates are unacceptable for Xpert Xpress SARS-CoV-2/FLU/RSV  testing.  Fact Sheet for Patients: PinkCheek.be  Fact Sheet for Healthcare Providers: GravelBags.it  This test is not yet approved or cleared by the [place] FDA and  has been authorized for detection and/or diagnosis of SARS-CoV-2 by  FDA under an Emergency Use Authorization (EUA). This EUA will remain  in effect (meaning this test can be used) for the duration of the  Covid-19 declaration under Section 564(b)(1) of the Act, 21  U.S.C. section 360bbb-3(b)(1), unless the authorization is  terminated or revoked. Performed at [facility], [place] [address] [place], [place] [address]   Aerobic/Anaerobic Culture (surgical/deep wound)     Status: None   Collection Time: 04/13/20  3:51 PM   Specimen: Foot  Result Value Ref Range Status   Specimen Description FOOT LEFT  Final   Special Requests NONE  Final   Gram Stain   Final    RARE WBC PRESENT, PREDOMINANTLY PMN RARE GRAM POSITIVE COCCI    Culture   Final    FEW STAPHYLOCOCCUS AUREUS NO ANAEROBES ISOLATED Performed at [facility], [place] [address], [place], [place] [address]    Report Status 04/18/2020 FINAL  Final   Organism ID, Bacteria STAPHYLOCOCCUS AUREUS  Final      Susceptibility   Staphylococcus aureus - MIC*    CIPROFLOXACIN <=0.5 SENSITIVE Sensitive     ERYTHROMYCIN <=0.25 SENSITIVE Sensitive     GENTAMICIN <=0.5 SENSITIVE Sensitive     OXACILLIN 0.5 SENSITIVE Sensitive     TETRACYCLINE <=1 SENSITIVE Sensitive     VANCOMYCIN <=0.5 SENSITIVE Sensitive     TRIMETH/SULFA <=10 SENSITIVE Sensitive     CLINDAMYCIN <=0.25 SENSITIVE Sensitive     RIFAMPIN <=0.5 SENSITIVE Sensitive     Inducible Clindamycin NEGATIVE Sensitive     * FEW STAPHYLOCOCCUS AUREUS  Culture, blood (routine x 2)     Status: None (Preliminary result)   Collection Time: 04/15/20  4:37 PM   Specimen: BLOOD LEFT  HAND  Result Value Ref Range Status   Specimen Description BLOOD LEFT HAND  Final   Special Requests   Final    BOTTLES DRAWN AEROBIC AND ANAEROBIC Blood Culture adequate volume   Culture   Final    NO GROWTH 3 DAYS Performed at [facility], [place] [address] [place], [place] [address]    Report Status PENDING  Incomplete  Culture, blood (routine x 2)     Status: None (Preliminary result)   Collection Time: 04/15/20  4:45 PM   Specimen: BLOOD RIGHT HAND  Result Value Ref Range Status   Specimen Description BLOOD RIGHT HAND  Final   Special Requests   Final    BOTTLES DRAWN AEROBIC AND ANAEROBIC Blood Culture adequate volume   Culture   Final    NO GROWTH 3 DAYS Performed at [facility], [place] [address] [place], [place] [address]    Report Status PENDING  Incomplete  Surgical PCR screen     Status: None   Collection Time: 04/17/20  5:04 AM   Specimen: Nasal Mucosa; Nasal Swab  Result Value Ref Range Status   MRSA, PCR NEGATIVE NEGATIVE Final   Staphylococcus aureus NEGATIVE NEGATIVE Final    Comment: (NOTE) The Xpert SA Assay (FDA approved for NASAL specimens in patients 9 years of age and older), is one component of a comprehensive surveillance program. It is not intended to diagnose infection nor to guide or monitor treatment. Performed at [facility], [place] [address] [place], [place] [address]     Sanjuan Dame, MD Internal Medicine PGY-1 [phone]

## 2020-04-18 NOTE — Progress Notes (Signed)
Peripherally Inserted Central Catheter Placement  The IV Nurse has discussed with the patient and/or persons authorized to consent for the patient, the purpose of this procedure and the potential benefits and risks involved with this procedure.  The benefits include less needle sticks, lab draws from the catheter, and the patient may be discharged home with the catheter. Risks include, but not limited to, infection, bleeding, blood clot (thrombus formation), and puncture of an artery; nerve damage and irregular heartbeat and possibility to perform a PICC exchange if needed/ordered by physician.  Alternatives to this procedure were also discussed.  Bard Power PICC patient education guide, fact sheet on infection prevention and patient information card has been provided to patient /or left at bedside.    PICC Placement Documentation  PICC Single Lumen 82/42/35 PICC Right Basilic 40 cm 0 cm (Active)  Indication for Insertion or Continuance of Line Home intravenous therapies (PICC only) 04/18/20 1834  Exposed Catheter (cm) 0 cm 04/18/20 1834  Site Assessment Clean;Dry;Intact 04/18/20 1834  Line Status Flushed;Blood return noted;Saline locked 04/18/20 1834  Dressing Type Transparent 04/18/20 1834  Dressing Status Clean;Dry;Intact 04/18/20 1834  Antimicrobial disc in place? Yes 04/18/20 1834  Dressing Change Due 04/25/20 04/18/20 1834       [address] 04/18/2020, 6:36 PM

## 2020-04-19 ENCOUNTER — Encounter (HOSPITAL_COMMUNITY): Payer: Self-pay | Admitting: Student in an Organized Health Care Education/Training Program

## 2020-04-19 DIAGNOSIS — M86172 Other acute osteomyelitis, left ankle and foot: Secondary | ICD-10-CM | POA: Diagnosis present

## 2020-04-19 HISTORY — DX: Other acute osteomyelitis, left ankle and foot: M86.172

## 2020-04-19 LAB — GLUCOSE, CAPILLARY
Glucose-Capillary: 253 mg/dL — ABNORMAL HIGH (ref 70–99)
Glucose-Capillary: 227 mg/dL — ABNORMAL HIGH (ref 70–99)

## 2020-04-19 LAB — CBC
HCT: 22.2 % — ABNORMAL LOW (ref 39.0–52.0)
Hemoglobin: 7.4 g/dL — ABNORMAL LOW (ref 13.0–17.0)
MCH: 29.5 pg (ref 26.0–34.0)
MCHC: 33.3 g/dL (ref 30.0–36.0)
MCV: 88.4 fL (ref 80.0–100.0)
Platelets: 598 10*3/uL — ABNORMAL HIGH (ref 150–400)
RBC: 2.51 MIL/uL — ABNORMAL LOW (ref 4.22–5.81)
RDW: 12.8 % (ref 11.5–15.5)
WBC: 15.2 10*3/uL — ABNORMAL HIGH (ref 4.0–10.5)
nRBC: 0 % (ref 0.0–0.2)

## 2020-04-19 LAB — BASIC METABOLIC PANEL
Anion gap: 6 (ref 5–15)
BUN: 16 mg/dL (ref 6–20)
CO2: 24 mmol/L (ref 22–32)
Calcium: 7.8 mg/dL — ABNORMAL LOW (ref 8.9–10.3)
Chloride: 105 mmol/L (ref 98–111)
Creatinine, Ser: 1 mg/dL (ref 0.61–1.24)
GFR, Estimated: >60 mL/min (ref >60)
Glucose, Bld: 262 mg/dL — ABNORMAL HIGH (ref 70–99)
Potassium: 3.9 mmol/L (ref 3.5–5.1)
Sodium: 135 mmol/L (ref 135–145)

## 2020-04-19 LAB — METHYLMALONIC ACID, SERUM: Methylmalonic Acid, Quantitative: 217 nmol/L (ref 0–378)

## 2020-04-19 LAB — SAVE SMEAR(SSMR), FOR PROVIDER SLIDE REVIEW

## 2020-04-19 LAB — HAPTOGLOBIN: Haptoglobin: 366 mg/dL (ref 29–370)

## 2020-04-19 MED ORDER — LISINOPRIL 40 MG PO TABS: 40.0000 mg | ORAL_TABLET | Freq: Every day | ORAL | Status: DC

## 2020-04-19 MED ORDER — ATORVASTATIN CALCIUM 40 MG PO TABS
40.0000 mg | ORAL_TABLET | Freq: Every day | ORAL | 0 refills | Status: DC
Start: 2020-04-20 — End: 2022-02-05

## 2020-04-19 MED ORDER — CYANOCOBALAMIN 1000 MCG PO TABS
1000.0000 ug | ORAL_TABLET | Freq: Every day | ORAL | 0 refills | Status: DC
Start: 2020-04-20 — End: 2020-08-30

## 2020-04-19 MED ORDER — FERROUS SULFATE 325 (65 FE) MG PO TABS
325.0000 mg | ORAL_TABLET | Freq: Every day | ORAL | 0 refills | Status: DC
Start: 2020-04-20 — End: 2020-08-30

## 2020-04-19 NOTE — TOC Progression Note (Signed)
Transition of Care [facility]) - Progression Note    Patient Details  Name: Keith Garrett MRN: [id number] Date of Birth: April 25, 1966  [facility]) CM/SW Contact  Graves-Bigelow, Ocie Cornfield, RN Phone Number: 04/19/2020, 12:00 PM  Clinical Narrative: Case Manager spoke to patient and he is without insurance. [employer] insurance is showing. Patient will get IV antibiotics from [facility]. [facility] are unable to accept the patient at this time- they are maxed out for services. Case Manager did call Tommi Rumps with [facility] and they can accept a letter of guarantee (LOG) for RN services. LOG approved by Advanced Care Supervisor.   Start of care to begin on 04-22-20. Pam with [facility] will provide more education this afternoon regarding PICC line. Patient has family support and will have transportation home. Case Manager will continue to follow for additional transition of care needs.   Expected Discharge Plan: [place] Barriers to Discharge: No Barriers Identified  Expected Discharge Plan and Services Expected Discharge Plan: [place] In-house Referral: Financial Counselor Discharge Planning Services: CM Consult Post Acute Care Choice: [place] arrangements for the past 2 months: Single Family Home                   DME Agency: NA       HH Arranged: RN [place] Agency: [facility] Date [facility] Agency Contacted: 04/19/20 Time [place]: 1159 Representative spoke with at [place]: Tommi Rumps  Readmission Risk Interventions No flowsheet data found.

## 2020-04-19 NOTE — Progress Notes (Signed)
Family Medicine Teaching Service Daily Progress Note Intern Pager: [phone]  Patient name: Rodric Punch Medical record number: [id number] Date of birth: 1965-08-01 Age: 54 y.o. Gender: male  Primary Care Provider: Patient, No Pcp Per Consultants: Ortho, Vascular, Cardiology Code Status: Full  Pt Overview and Major Events to Date:  10/23 admitted 10/26 TEE 10/27 Angio  Assessment and Plan:   Bartley Arntson-Rodriguezis a 54 y.o.malepresenting withleft foot swelling and pain,concerning for cellulitisand possible underlying osteomyelitis.PMHxis significant for Diabetes and Hypertension.   Left FootCellulitisw/ulceration andabscess formation, c/f underlying osteomyelitis:Acute, improving. MRI L footon 10/23 consistent with cellulitis and patchy bone marrow edema within the calcaneal body/tuberosity concerning for possible reactive osteitisvsearly osteomyelitis. WBC is 15.2 was afebrile overnight. Ortho will follow up outpatient to determine further interventions. ID recommends 4-6 weeks IV antibiotics. TOC is working to find [facility] which [facility] provide the services. -Orthorecommendsextremity elevation with TIDdressing changes. -ContinueIV Ancef 2g q8 (10/25-) -Follow wound culture(done at bedside in ED), few Staph aureus seen -Follow-up blood culture,no growth 3 days -Monitor CBC   HTN: BP151/80. On Norvasc and Lisinopril at home. -Continue home Norvasc 10 mg -Continue Lisinopril40mg today  -Monitor BP   Type II DM: Did not require SSI overnight. A1c6.7.Restarted COSOPT. -ContinueGlipizide 5 mg daily -SSI with meals/bedtime as needed -CBGs with above -ContinueCOSOPT -Hold home Metformin   Normocytic anemia:Unclear chronicity, stable. Hgbthis morning is7.1.On admission Hgb was 8.5.Slight increase in immature reticulocyte fraction, otherwise anemia panel still pending. No evidence of activeblood loss at this time.B12  started.Will continue to trend and transfuse if needed. -Continue B12 1000 mcg daily -Continue Ferrous Sulfate 325 mg daily -Monitor CBC   Back pain,in setting of recent ZJQ:BHALP, improving. Ambulation normal. No fractures on x-ray. -Tylenol as needed -K pad -Trial baclofen as needed for spasms   FEN/GI: Carb Modified PPx: Lovenox  Disposition: Med-Surg,   Prior to Admission Living Arrangement: Home Anticipated Discharge Location: Home Barriers to Discharge: Setting up [facility]  Anticipated discharge in approximately 0-1 day(s).   Subjective:  Interviewed patient at bedside.  He reports doing well today. Reports his foot still has pain but is better. Discussed needing to arrange his antibiotics and then will be able to discharge. He has no complaints at this time.   Objective: Temp:  [97.9 F (36.6 C)-99.7 F (37.6 C)] 98.4 F (36.9 C) (10/29 0420) Pulse Rate:  [80-87] 84 (10/28 2015) Resp:  [15-20] 16 (10/29 0420) BP: (139-159)/(62-86) 151/80 (10/29 0420) SpO2:  [97 %-100 %] 98 % (10/29 0420) Physical Exam:  Physical Exam Vitals and nursing note reviewed.  Constitutional:      General: He is not in acute distress.    Appearance: Normal appearance. He is not ill-appearing, toxic-appearing or diaphoretic.  HENT:     Head: Normocephalic and atraumatic.  Cardiovascular:     Rate and Rhythm: Normal rate and regular rhythm.     Pulses: Normal pulses.          Radial pulses are 2+ on the right side and 2+ on the left side.       Dorsalis pedis pulses are 2+ on the right side and 2+ on the left side.     Heart sounds: Normal heart sounds, S1 normal and S2 normal. No murmur heard.   Pulmonary:     Effort: Pulmonary effort is normal. No respiratory distress.     Breath sounds: Normal breath sounds. No wheezing.  Abdominal:     General: There is no distension.     Palpations: There is  no mass.     Tenderness: There is no abdominal tenderness.  Musculoskeletal:      Right lower leg: No edema.     Left lower leg: No edema.  Neurological:     Mental Status: He is alert. Mental status is at baseline.  Psychiatric:        Mood and Affect: Mood normal.        Behavior: Behavior normal.        Thought Content: Thought content normal.      Laboratory: Recent Labs  Lab 04/16/20 0920 04/16/20 0920 04/17/20 0025 04/17/20 1136 04/18/20 0118  WBC 18.1*  --  17.0*  --  15.4*  HGB 8.1*   < > 7.2* 7.2* 7.1*  HCT 24.3*   < > 21.6* 21.7* 21.5*  PLT 616*  --  553*  --  545*   < > = values in this interval not displayed.   Recent Labs  Lab 04/14/20 0624 04/14/20 0624 04/16/20 0920 04/17/20 0025 04/18/20 0118  NA 136   < > 134* 135 133*  K 3.9   < > 3.9 3.9 3.7  CL 106   < > 103 104 104  CO2 22   < > 22 23 23   BUN 16   < > 18 19 17   CREATININE 0.95   < > 1.06 1.07 1.19  CALCIUM 8.3*   < > 8.2* 8.1* 7.6*  PROT 6.7  --   --   --   --   BILITOT 0.9  --   --   --   --   ALKPHOS 159*  --   --   --   --   ALT 25  --   --   --   --   AST 27  --   --   --   --   GLUCOSE 107*   < > 193* 117* 184*   < > = values in this interval not displayed.    MMA:217 (WNL)  Imaging/Diagnostic Tests:   Aortogram: Aortogram showed patent renal arteries bilaterally as well as a widely patent aortoiliac segment with no flow-limiting stenosis.  Left lower extremity arteriogram which was the sideof interest showed a widely patent common femoral, profunda, SFA, above and below-knee popliteal artery and patent three-vessel runoff. Dominant runoff in the left leg was the posterior tibial with filling of the plantar arch. The anterior tibial appeared to occlude just distal to theankle joint suggesting small vessel disease.  Right lower extremity showed a patent common femoral, profunda, SFA, above and below-knee popliteal artery three-vessel runoff.  Echo TEE: EF 60-65%. No Left atrial Thrombus seen, no vegetation seen. All other structures examined grossly  normal.   Echo: EF 65-70%. Grade II diastolic dysfunction. Left atria moderately dilated. All other structures examined were grossly normal.   VAS [place] ABI W/WO MLY:YTKPT: Resting right ankle-brachial index indicates noncompressible right lower extremity arteries.  Left: Resting left ankle-brachial index indicates noncompressible left lower extremity arteries.    VAS [place] WSF:KCLEX: No evidence of common femoral vein obstruction.  LEFT: There is no evidence of deep vein thrombosis in the lower extremity. No cystic structure found in the popliteal fossa.   MR Foot Left W WO Contrast:Superficial skin ulceration at the lateral aspect of the hindfoot at the level of the calcaneocuboid joint with associated edema within the underlying soft tissues, likely representing cellulitis. There is mild tenosynovitis of the traversing peroneal tendons. No organized or rim enhancing fluid collection  near the ulcer base. Prominent patchy bone marrow edema within the calcaneal body and plantar aspect of the calcaneal tuberosity. There is patchy low to intermediate T1 signal within the calcaneal body and plantar aspect of the calcaneal tuberosity without definite cortical erosion. Findings may be related to reactive osteitis although early acute osteomyelitis is difficult to exclude. High-grade near complete tear of the central band of the plantar fascia from its proximal attachment on the calcaneal tuberosity. Small fluid collection along the medial margin of the mid secondmetatarsal diaphysis measuring 1.3 x 0.3 x 1.1 cm with peripheral rim enhancement, suggesting a small abscess. Diffuse edema-like signal throughout the intrinsic foot musculature which may reflect a nonspecific myositis.   DG Foot Complete Left:No acute findings. Extensive vascular calcifications consistent with underlying diabetes.   DG Chest 2 View 10/18:No active cardiopulmonary disease.   DG Lumbar Spine Complete  10/18:Mild degenerative changes as described above. No acute abnormality seen in the lumbar spine.   Briant Cedar, MD 04/19/2020, 5:52 AM PGY-1, [place] Intern pager: [phone], text pages welcome

## 2020-04-19 NOTE — Discharge Instructions (Signed)
Thank you for letting us care for you during your stay.  You were admitted to the [facility] Teaching Service.   You were admitted for an ulceration of your foot. You will need to follow up at the orthopedics office with Dr. Sharol Given in approximately one week.   We found the following during your stay:  bacteria in your blood. For this, We have sent you home with antibiotics to be given through the IV line.  You will do this for the next month.  You will need to follow up with the  the Infectious Disease specialists on November 11th. .    You will also need to make an appointment with your primary care doctors, [place].     Other important diagnoses identified during your stay were anemia and B12 deficiency.  Please take iron and B12 supplements as precribed.    Please follow up with your primary care physician in 1 week.   If your symptoms worsen or return, please return to the hospital.  Please let us know if you have questions about your stay at [facility].

## 2020-04-19 NOTE — Progress Notes (Signed)
Inpatient Diabetes Program Recommendations  AACE/ADA: New Consensus Statement on Inpatient Glycemic Control (2015)  Target Ranges:  Prepandial:   less than 140 mg/dL      Peak postprandial:   less than 180 mg/dL (1-2 hours)      Critically ill patients:  140 - 180 mg/dL   Lab Results  Component Value Date   GLUCAP 253 (H) 04/19/2020   HGBA1C 6.7 (H) 04/15/2020    Review of Glycemic Control Results for Keith Garrett, Keith Garrett (MRN [id number]) as of 04/19/2020 11:06  Ref. Range 04/18/2020 11:43 04/18/2020 16:52 04/18/2020 20:35 04/19/2020 06:23  Glucose-Capillary Latest Ref Range: 70 - 99 mg/dL 235 (H) 220 (H) 161 (H) 253 (H)   Diabetes history: Type 2 DM  Outpatient Diabetes medications: Metformin 1000 mg BID, Glipizide 10 mg QD Current orders for Inpatient glycemic control: Novolog 0-9 units TID, HS, Glipizide 5 mg QD  Inpatient Diabetes Program Recommendations:    Change diet carb modified diet.   Thanks, Bronson Curb, MSN, RNC-OB Diabetes Coordinator [phone] (8a-5p)

## 2020-04-20 LAB — CULTURE, BLOOD (ROUTINE X 2)
Culture: NO GROWTH
Culture: NO GROWTH
Special Requests: ADEQUATE
Special Requests: ADEQUATE

## 2020-04-22 LAB — PATHOLOGIST SMEAR REVIEW

## 2020-05-02 ENCOUNTER — Inpatient Hospital Stay: Payer: Self-pay | Admitting: Family

## 2020-05-13 ENCOUNTER — Other Ambulatory Visit: Payer: Self-pay | Admitting: Internal Medicine

## 2020-05-13 ENCOUNTER — Telehealth: Payer: Self-pay

## 2020-05-13 MED ORDER — CEPHALEXIN 500 MG PO CAPS: 500.0000 mg | ORAL_CAPSULE | Freq: Four times a day (QID) | ORAL | 0 refills | Status: DC

## 2020-05-13 NOTE — Telephone Encounter (Signed)
Ok to pull picc line.  He should continue with oral Keflex after that and I have sent in the prescription. thanks

## 2020-05-13 NOTE — Telephone Encounter (Signed)
Call placed to [facility] and gave verbal order per Dr. Linus Salmons to pull picc at the end of therapy. Verbal order read back understood by Tim.

## 2020-05-13 NOTE — Telephone Encounter (Signed)
Received call from [facility] regarding pull picc orders.   IV therapy end date of of 05/14/20 (cefazolin) treatment of left foot cellulitis. Patient provided sooner appointment but patient declined due to brother passing away today and he had too much to arrange. Patient scheduled with Dr. Linus Salmons on 05/21/20 routing to MD for advise regarding pull picc orders. Keith Garrett

## 2020-05-14 ENCOUNTER — Encounter: Payer: Self-pay | Admitting: Internal Medicine

## 2020-05-21 ENCOUNTER — Telehealth: Payer: Self-pay

## 2020-05-21 ENCOUNTER — Encounter: Payer: Self-pay | Admitting: Internal Medicine

## 2020-05-21 ENCOUNTER — Other Ambulatory Visit: Payer: Self-pay

## 2020-05-21 ENCOUNTER — Ambulatory Visit (INDEPENDENT_AMBULATORY_CARE_PROVIDER_SITE_OTHER): Payer: Self-pay | Admitting: Internal Medicine

## 2020-05-21 VITALS — BP 151/80 | HR 88 | Temp 98.4°F | Wt 206.8 lb

## 2020-05-21 DIAGNOSIS — M86172 Other acute osteomyelitis, left ankle and foot: Secondary | ICD-10-CM

## 2020-05-21 DIAGNOSIS — Z452 Encounter for adjustment and management of vascular access device: Secondary | ICD-10-CM | POA: Insufficient documentation

## 2020-05-21 DIAGNOSIS — E1142 Type 2 diabetes mellitus with diabetic polyneuropathy: Secondary | ICD-10-CM

## 2020-05-21 DIAGNOSIS — L97521 Non-pressure chronic ulcer of other part of left foot limited to breakdown of skin: Secondary | ICD-10-CM

## 2020-05-21 MED ORDER — CEPHALEXIN 500 MG PO CAPS: 500.0000 mg | ORAL_CAPSULE | Freq: Four times a day (QID) | ORAL | 0 refills | Status: DC

## 2020-05-21 NOTE — Telephone Encounter (Signed)
Per MD called [facility] to pull picc today. Spoke with Jackelyn Poling who has orders already.

## 2020-05-21 NOTE — Assessment & Plan Note (Signed)
At this point, with poor compliance of his IV medication, I think oral Keflex will be a better option for him.  I will give him a one month supply for him to continue and follow up with him inJanuary to see how he is doing (he will be in [place] through January 15th).  I discussed the importance of compliance with risk being losing his foot or bacteremia/death.  He voiced his understanding.

## 2020-05-21 NOTE — Assessment & Plan Note (Signed)
He is continuing with wound care and will follow up with Dr. Sharol Given.

## 2020-05-21 NOTE — Progress Notes (Signed)
   Subjective:    Patient ID: Keith Garrett, male    DOB: 03-17-1966, 54 y.o.   MRN: [id number]  HPI He is here for hsfu He has a history of DM, HTN and developed a foot abscess and bacteremia with MSSA.  TEE negative for vegetation. He tells me he has been taking the cefazolin well with no issues.  He though was scheduled to finish on 11/19 but still had medication left over at that time so kept the picc line and continued.  He states he has been infusing three times a day.  However, per home health, he did not get any new supply to last him this long and his left over medicine he has at home has no expired.  It appears he has not been getting the medication at all.  He has not been back to see Dr. Sharol Given.  Last ESR was 96.  CRP wnl.     Review of Systems  Constitutional: Negative for chills and fever.  Gastrointestinal: Negative for diarrhea and nausea.  Skin: Negative for rash.       Objective:   Physical Exam Eyes:     General: No scleral icterus. Musculoskeletal:     Comments: Foot open, no surrounding erythema, no drainage.    Neurological:     Mental Status: He is alert.    SH: no tobacco      Assessment & Plan:

## 2020-05-21 NOTE — Telephone Encounter (Signed)
Received call from home health nurse Inez Catalina from [place]. She states the patient was due to finish his IV Abx on 05/10/20. She called the patient then to check on him and the patient stated he still had 10 bags left. Inez Catalina advised the patient to finish the course of IV ABx. She called him again to check on his progress and he again stated he had 10 bags of medication left. Per Inez Catalina, the pharmacy says the bags are expired and cannot be used. Dr. Linus Salmons notified.   Beryle Flock, RN

## 2020-05-21 NOTE — Assessment & Plan Note (Signed)
I discussed the importance of blood sugar monitoring and control

## 2020-05-21 NOTE — Assessment & Plan Note (Signed)
Will have the picc line pulled since he is not infusing much of the medication

## 2020-05-22 ENCOUNTER — Telehealth: Payer: Self-pay | Admitting: Orthopedic Surgery

## 2020-05-22 NOTE — Telephone Encounter (Signed)
Inez Catalina from [place] called. Says patient was discharged from home care today. Patient refused to let her look at his leg. Her call back number is [phone]

## 2020-05-22 NOTE — Telephone Encounter (Signed)
noted 

## 2020-07-15 ENCOUNTER — Ambulatory Visit: Payer: Self-pay | Admitting: Internal Medicine

## 2020-08-16 ENCOUNTER — Ambulatory Visit (INDEPENDENT_AMBULATORY_CARE_PROVIDER_SITE_OTHER): Payer: Self-pay | Admitting: Internal Medicine

## 2020-08-16 ENCOUNTER — Other Ambulatory Visit: Payer: Self-pay

## 2020-08-16 ENCOUNTER — Encounter: Payer: Self-pay | Admitting: Internal Medicine

## 2020-08-16 VITALS — BP 152/82 | HR 83 | Wt 208.0 lb

## 2020-08-16 DIAGNOSIS — M86172 Other acute osteomyelitis, left ankle and foot: Secondary | ICD-10-CM

## 2020-08-16 MED ORDER — CEPHALEXIN 500 MG PO CAPS
500.0000 mg | ORAL_CAPSULE | Freq: Four times a day (QID) | ORAL | 0 refills | Status: DC
Start: 2020-08-16 — End: 2022-01-24

## 2020-08-16 NOTE — Progress Notes (Signed)
[facility] for Infectious Disease  CHIEF COMPLAINT:    Follow up for osteomyelitis  SUBJECTIVE:    Keith Garrett is a 55 y.o. male with PMHx as below who presents to the clinic for osteomyelitis.   Patient is followed in our clinic by Dr. Harold Hedge. He has a history of diabetes, hypertension, and left foot abscess with MSSA bacteremia in the fall 2021. He was also evaluated by Dr. Sharol Given, however, had not been following up with him as an outpatient either. He was last seen in our office on November 30 and noted to be having poor adherence with IV medication. He was subsequently transitioned to oral cephalexin. He subsequently went to [place] and stayed there longer than he had intended which caused him to miss his follow-up appointment in January. He has now returned to [place] and reports being off Keflex for approximately 3 weeks. He states that when he was on the medication his left foot was doing fine and he had no issues. Since stopping the medicine he is reported increased swelling and drainage but no significant pain.  Please see A&P for the details of today's visit and status of the patient's medical problems.   Patient's Medications  New Prescriptions   No medications on file  Previous Medications   AMLODIPINE (NORVASC) 10 MG TABLET    Take 10 mg by mouth daily.   ASPIRIN EC 81 MG TABLET    Take 81 mg by mouth daily.   ATORVASTATIN (LIPITOR) 40 MG TABLET    Take 1 tablet (40 mg total) by mouth daily.   DORZOLAMIDE-TIMOLOL (COSOPT) 22.3-6.8 MG/ML OPHTHALMIC SOLUTION    Place 1 drop into the left eye 2 (two) times daily.    FERROUS SULFATE 325 (65 FE) MG TABLET    Take 1 tablet (325 mg total) by mouth daily.   GLIPIZIDE (GLUCOTROL) 10 MG TABLET    Take 10 mg by mouth daily.   LISINOPRIL (ZESTRIL) 40 MG TABLET    Take 40 mg by mouth daily.   METFORMIN (GLUCOPHAGE) 1000 MG TABLET    Take 1,000 mg by mouth 2 (two) times daily.   VITAMIN B-12 1000 MCG  TABLET    Take 1 tablet (1,000 mcg total) by mouth daily.  Modified Medications   Modified Medication Previous Medication   CEPHALEXIN (KEFLEX) 500 MG CAPSULE cephALEXin (KEFLEX) 500 MG capsule      Take 1 capsule (500 mg total) by mouth 4 (four) times daily.    Take 1 capsule (500 mg total) by mouth 4 (four) times daily.  Discontinued Medications   No medications on file      Past Medical History:  Diagnosis Date  . Cellulitis of foot, left 04/13/2020  . Diabetes mellitus without complication ([place])   . Hypertension   . Osteomyelitis of ankle or foot, acute, left ([place]) 04/19/2020  . Traumatic rupture of plantar fascia of left foot 04/14/2020    Social History   Tobacco Use  . Smoking status: Never Smoker  . Smokeless tobacco: Never Used  Substance Use Topics  . Alcohol use: Yes    Comment: occasionally  . Drug use: No    No family history on file.  No Known Allergies  Review of Systems  Constitutional: Negative for chills and fever.  Gastrointestinal: Negative.   Musculoskeletal: Positive for joint pain.  Skin:       + wound drainage     OBJECTIVE:    Vitals:  08/16/20 1004  BP: (!) 152/82  Pulse: 83  Weight: 208 lb (94.3 kg)   Body mass index is 38.04 kg/m.  Physical Exam Constitutional:      General: He is not in acute distress.    Appearance: Normal appearance.  Musculoskeletal:     Comments: Left heel wound with non-malodorous drainage.  No significant warmth or erythema.  No tenderness.   Neurological:     Mental Status: He is alert.      Labs and Microbiology: CBC Latest Ref Rng & Units 04/19/2020 04/18/2020 04/17/2020  WBC 4.0 - 10.5 K/uL 15.2(H) 15.4(H) -  Hemoglobin 13.0 - 17.0 g/dL 7.4(L) 7.1(L) 7.2(L)  Hematocrit 39.0 - 52.0 % 22.2(L) 21.5(L) 21.7(L)  Platelets 150 - 400 K/uL 598(H) 545(H) -   CMP Latest Ref Rng & Units 04/19/2020 04/18/2020 04/17/2020  Glucose 70 - 99 mg/dL 262(H) 184(H) 117(H)  BUN 6 - 20 mg/dL '16 17 19   '$ Creatinine 0.61 - 1.24 mg/dL 1.00 1.19 1.07  Sodium 135 - 145 mmol/L 135 133(L) 135  Potassium 3.5 - 5.1 mmol/L 3.9 3.7 3.9  Chloride 98 - 111 mmol/L 105 104 104  CO2 22 - 32 mmol/L '24 23 23  '$ Calcium 8.9 - 10.3 mg/dL 7.8(L) 7.6(L) 8.1(L)  Total Protein 6.5 - 8.1 g/dL - - -  Total Bilirubin 0.3 - 1.2 mg/dL - - -  Alkaline Phos 38 - 126 U/L - - -  AST 15 - 41 U/L - - -  ALT 0 - 44 U/L - - -     No results found for this or any previous visit (from the past 240 hour(s)).    ASSESSMENT & PLAN:    Osteomyelitis of ankle or foot, acute, left ([place]) He seems to have been doing okay while on oral cephalexin, however, has developed increased drainage and swelling since stopping about 3 weeks ago. He has also yet to follow-up with Dr. Sharol Given. I have concern for osteomyelitis of his left foot that will require surgical management. For now, will resume his oral antibiotics, request that he follows up with Dr. Sharol Given, and return to our clinic with Dr. Linus Salmons in about 2 weeks to see how he is doing. At that point he may require repeat imaging to reassess for a deeper infection.  Will check labs today.    Orders Placed This Encounter  Procedures  . CBC  . Basic metabolic panel    Order Specific Question:   Has the patient fasted?    Answer:   No  . C-reactive protein  . Sedimentation rate       Keith Garrett for Infectious Disease [facility] 08/16/2020, 12:45 PM

## 2020-08-16 NOTE — Patient Instructions (Signed)
Thank you for coming to see me today. It was a pleasure seeing you.  To Do: Marland Kitchen Restart Keflex . Labs today . Follow up with Dr Linus Salmons and Dr Sharol Given  If you have any questions or concerns, please do not hesitate to call the office at [phone].  Take Care,   Jule Ser, DO

## 2020-08-16 NOTE — Assessment & Plan Note (Addendum)
He seems to have been doing okay while on oral cephalexin, however, has developed increased drainage and swelling since stopping about 3 weeks ago. He has also yet to follow-up with Dr. Sharol Given. I have concern for osteomyelitis of his left foot that will require surgical management. For now, will resume his oral antibiotics, request that he follows up with Dr. Sharol Given, and return to our clinic with Dr. Linus Salmons in about 2 weeks to see how he is doing. At that point he may require repeat imaging to reassess for a deeper infection.  Will check labs today.

## 2020-08-17 LAB — BASIC METABOLIC PANEL
BUN/Creatinine Ratio: 23 (calc) — ABNORMAL HIGH (ref 6–22)
BUN: 27 mg/dL — ABNORMAL HIGH (ref 7–25)
CO2: 24 mmol/L (ref 20–32)
Calcium: 8.9 mg/dL (ref 8.6–10.3)
Chloride: 108 mmol/L (ref 98–110)
Creat: 1.18 mg/dL (ref 0.70–1.33)
Glucose, Bld: 187 mg/dL — ABNORMAL HIGH (ref 65–99)
Potassium: 5.3 mmol/L (ref 3.5–5.3)
Sodium: 138 mmol/L (ref 135–146)

## 2020-08-17 LAB — CBC
HCT: 33.9 % — ABNORMAL LOW (ref 38.5–50.0)
Hemoglobin: 11.3 g/dL — ABNORMAL LOW (ref 13.2–17.1)
MCH: 29.1 pg (ref 27.0–33.0)
MCHC: 33.3 g/dL (ref 32.0–36.0)
MCV: 87.4 fL (ref 80.0–100.0)
MPV: 9.9 fL (ref 7.5–12.5)
Platelets: 390 10*3/uL (ref 140–400)
RBC: 3.88 10*6/uL — ABNORMAL LOW (ref 4.20–5.80)
RDW: 14 % (ref 11.0–15.0)
WBC: 10.4 10*3/uL (ref 3.8–10.8)

## 2020-08-17 LAB — SEDIMENTATION RATE: Sed Rate: 41 mm/h — ABNORMAL HIGH (ref 0–20)

## 2020-08-17 LAB — C-REACTIVE PROTEIN: CRP: 3.1 mg/L (ref <8.0)

## 2020-08-30 ENCOUNTER — Ambulatory Visit (INDEPENDENT_AMBULATORY_CARE_PROVIDER_SITE_OTHER): Payer: Self-pay | Admitting: Internal Medicine

## 2020-08-30 ENCOUNTER — Encounter: Payer: Self-pay | Admitting: Internal Medicine

## 2020-08-30 ENCOUNTER — Other Ambulatory Visit: Payer: Self-pay

## 2020-08-30 VITALS — BP 157/88 | HR 71 | Temp 98.1°F | Wt 211.0 lb

## 2020-08-30 DIAGNOSIS — M86172 Other acute osteomyelitis, left ankle and foot: Secondary | ICD-10-CM

## 2020-08-30 DIAGNOSIS — L97521 Non-pressure chronic ulcer of other part of left foot limited to breakdown of skin: Secondary | ICD-10-CM

## 2020-08-30 NOTE — Assessment & Plan Note (Signed)
He will need to follow up with Dr. Sharol Given to assure closure of this area.

## 2020-08-30 NOTE — Assessment & Plan Note (Signed)
It seems improved again now and I will have him continue with the keflex to finish the two weeks.  Since his wound remains open, I encouraged him to get back to Dr. Sharol Given to help continue to manage the wound to closure.  He voiced his understanding.  He will follow up here as needed.

## 2020-08-30 NOTE — Progress Notes (Signed)
RN gave patient Dr Jess Barters phone number for follow up. Landis Gandy, RN

## 2020-08-30 NOTE — Progress Notes (Signed)
   Subjective:    Patient ID: Keith Garrett, male    DOB: 07-13-1965, 56 y.o.   MRN: [id number]  HPI Here for follow up of osteomyelitis. He was hospitalized in October 2021 with foot abscess, MSSA bacteremia and his TEE was negative for vegetation.  His MRI c/w osteomyelitis and he was initially on cefazolin.  In follow up in clinic, he was less than forthcoming and turns out he was not giving himself the infusions of the cefazolin.  I then converted him to oral Keflex and he did not return until 2 weeks ago and saw my partner Dr. Juleen China due to increased drainage.  He was placed back on keflex and now is doing better.  No significant drainage.  He has not been back to Dr. Sharol Given.  No fever, no chills.    Review of Systems  Constitutional: Negative for fever.  Gastrointestinal: Negative for diarrhea and nausea.  Skin: Negative for rash.       Objective:   Physical Exam Eyes:     General: No scleral icterus. Musculoskeletal:     Comments: Left foot area has an open wound, no pus, no surrounding erythema.    Neurological:     Mental Status: He is alert.  Psychiatric:        Mood and Affect: Mood normal.           Assessment & Plan:

## 2020-09-19 DIAGNOSIS — B351 Tinea unguium: Secondary | ICD-10-CM | POA: Insufficient documentation

## 2020-09-19 DIAGNOSIS — D649 Anemia, unspecified: Secondary | ICD-10-CM | POA: Insufficient documentation

## 2020-09-19 DIAGNOSIS — R748 Abnormal levels of other serum enzymes: Secondary | ICD-10-CM | POA: Insufficient documentation

## 2020-09-19 DIAGNOSIS — I10 Essential (primary) hypertension: Secondary | ICD-10-CM | POA: Insufficient documentation

## 2020-09-19 DIAGNOSIS — S96819A Strain of other specified muscles and tendons at ankle and foot level, unspecified foot, initial encounter: Secondary | ICD-10-CM | POA: Insufficient documentation

## 2020-09-19 DIAGNOSIS — H538 Other visual disturbances: Secondary | ICD-10-CM | POA: Insufficient documentation

## 2020-09-23 ENCOUNTER — Telehealth: Payer: Self-pay

## 2020-09-23 NOTE — Telephone Encounter (Signed)
No refills.  He should have finished already.  Will wait until he sees Sharol Given if there are any new concerns. thanks

## 2020-09-23 NOTE — Telephone Encounter (Signed)
Patient called requesting refills on Keflex. Unable to see Dr. Sharol Given until this Thursday. Unsure if he's still supposed to be taking it.   Forwarding to provider to give "okay" for refills or if he's ok to wait until being seen.  Keith Pixley Lorita Officer, RN

## 2020-09-23 NOTE — Telephone Encounter (Signed)
Notified patient that he will not need further antibiotics at this time. He's following up with Dr Sharol Given this Thursday and based off of that appointment, our team with assist as needed. Ok to remain off of abx right now.   Cordarrell Sane Lorita Officer, RN

## 2020-09-26 ENCOUNTER — Encounter: Payer: Self-pay | Admitting: Orthopedic Surgery

## 2020-09-26 ENCOUNTER — Ambulatory Visit (INDEPENDENT_AMBULATORY_CARE_PROVIDER_SITE_OTHER): Payer: Self-pay | Admitting: Orthopedic Surgery

## 2020-09-26 DIAGNOSIS — L97521 Non-pressure chronic ulcer of other part of left foot limited to breakdown of skin: Secondary | ICD-10-CM

## 2020-09-26 MED ORDER — CIPROFLOXACIN HCL 500 MG PO TABS: 500.0000 mg | ORAL_TABLET | Freq: Two times a day (BID) | ORAL | 0 refills | Status: DC

## 2020-09-26 NOTE — Progress Notes (Signed)
Office Visit Note   Patient: Keith Garrett           Date of Birth: 1965/11/13           MRN: [id number] Visit Date: 09/26/2020              Requested by: Salvadore Farber, [address] [place],  [place] [address] PCP: Salvadore Farber, FNP  Chief Complaint  Patient presents with  . Left Foot - Pain      HPI: Patient is a 55 year old gentleman who is seen for evaluation of a ulcer lateral aspect of the left foot.  Patient underwent an MRI scan this past October that showed no abscess no definite osteomyelitis but there was some edema beneath the ulcer of the lateral aspect of the left foot.  Patient also underwent ankle-brachial indices last October which showed triphasic noncompressible flow with three-vessel runoff from the popliteal artery with some occlusions distal to the ankle.  Assessment & Plan: Visit Diagnoses: No diagnosis found.  Plan: We will call in a prescription for Cipro patient has been on Keflex.  Will reevaluate in 2 weeks continue with dry dressing change daily.  Follow-Up Instructions: No follow-ups on file.   Ortho Exam  Patient is alert, oriented, no adenopathy, well-dressed, normal affect, normal respiratory effort. Examination I cannot palpate a dorsalis pedis or posterior tibial pulse he does have venous swelling.  There is some dermatitis laterally with a very small open wound with clear serous drainage there is no purulence no cellulitis.  The ulcer is 5 x 1 mm and 1 mm deep this does not probe to bone or tendon.  Patient's most recent hemoglobin A1c this past October was 6.7.  Imaging: No results found. No images are attached to the encounter.  Labs: Lab Results  Component Value Date   HGBA1C 6.7 (H) 04/15/2020   HGBA1C 6.9 (H) 04/14/2020   ESRSEDRATE 41 (H) 08/16/2020   ESRSEDRATE 140 (H) 04/14/2020   CRP 3.1 08/16/2020   CRP 21.0 (H) 04/14/2020   REPTSTATUS 04/20/2020 FINAL 04/15/2020   GRAMSTAIN  04/13/2020    RARE WBC PRESENT,  PREDOMINANTLY PMN RARE GRAM POSITIVE COCCI    CULT  04/15/2020    NO GROWTH 5 DAYS Performed at [facility], [place] [address], [place], [place] [address]    LABORGA STAPHYLOCOCCUS AUREUS 04/13/2020     Lab Results  Component Value Date   ALBUMIN 1.8 (L) 04/14/2020   ALBUMIN 3.9 09/21/2009    No results found for: MG No results found for: VD25OH  No results found for: PREALBUMIN CBC EXTENDED Latest Ref Rng & Units 08/16/2020 04/19/2020 04/18/2020  WBC 3.8 - 10.8 Thousand/uL 10.4 15.2(H) 15.4(H)  RBC 4.20 - 5.80 Million/uL 3.88(L) 2.51(L) 2.45(L)  HGB 13.2 - 17.1 g/dL 11.3(L) 7.4(L) 7.1(L)  HCT 38.5 - 50.0 % 33.9(L) 22.2(L) 21.5(L)  PLT 140 - 400 Thousand/uL 390 598(H) 545(H)  NEUTROABS 1.7 - 7.7 K/uL - - -  LYMPHSABS 0.7 - 4.0 K/uL - - -     There is no height or weight on file to calculate BMI.  Orders:  No orders of the defined types were placed in this encounter.  No orders of the defined types were placed in this encounter.    Procedures: No procedures performed  Clinical Data: No additional findings.  ROS:  All other systems negative, except as noted in the HPI. Review of Systems  Objective: Vital Signs: There were no vitals taken  for this visit.  Specialty Comments:  No specialty comments available.  PMFS History: Patient Active Problem List   Diagnosis Date Noted  . Osteomyelitis of ankle or foot, acute, left ([place]) 04/19/2020  . Proteinuria 04/18/2020  . Staphylococcus aureus bacteremia 04/15/2020  . PVD (peripheral vascular disease) ([place])   . Severe protein-calorie malnutrition ([place])   . Diabetic polyneuropathy associated with type 2 diabetes mellitus ([place])   . Reactive thrombocytosis 04/14/2020  . Normocytic anemia 04/14/2020  . Hypoalbuminemia due to intermediate proteinuria  04/14/2020  . Calcification of artery 04/14/2020  . Bone marrow edema of left calcaneus 04/14/2020  . Tenosynovitis, mild, of the traversing left peroneal  tendons. 04/14/2020  . Traumatic rupture of plantar fascia of left foot 04/14/2020  . Myositis of left foot 04/14/2020  . Foot ulcer, left, limited to breakdown of skin ([place]) 04/14/2020  . 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 Diabetic retinopathy ([place]) 10/10/2019  . Bilateral cataracts 10/10/2019  . Transient ischemic attack 10/10/2019   Past Medical History:  Diagnosis Date  . Cellulitis of foot, left 04/13/2020  . Diabetes mellitus without complication ([place])   . Hypertension   . Osteomyelitis of ankle or foot, acute, left ([place]) 04/19/2020  . Traumatic rupture of plantar fascia of left foot 04/14/2020    History reviewed. No pertinent family history.  Past Surgical History:  Procedure Laterality Date  . ABDOMINAL AORTOGRAM W/LOWER EXTREMITY N/A 04/17/2020   Procedure: ABDOMINAL AORTOGRAM W/LOWER EXTREMITY;  Surgeon: Marty Heck, MD;  Location: [facility];  Service: Cardiovascular;  Laterality: N/A;  . TEE WITHOUT CARDIOVERSION N/A 04/16/2020   Procedure: TRANSESOPHAGEAL ECHOCARDIOGRAM (TEE);  Surgeon: [name], MD;  Location: [facility] ENDOSCOPY;  Service: Cardiovascular;  Laterality: N/A;   Social History   Occupational History  . Not on file  Tobacco Use  . Smoking status: Never Smoker  . Smokeless tobacco: Never Used  Substance and Sexual Activity  . Alcohol use: Yes    Comment: occasionally  . Drug use: No  . Sexual activity: Not Currently      *-

## 2020-10-10 ENCOUNTER — Ambulatory Visit (INDEPENDENT_AMBULATORY_CARE_PROVIDER_SITE_OTHER): Payer: Self-pay | Admitting: Physician Assistant

## 2020-10-10 ENCOUNTER — Encounter: Payer: Self-pay | Admitting: Orthopedic Surgery

## 2020-10-10 DIAGNOSIS — L97521 Non-pressure chronic ulcer of other part of left foot limited to breakdown of skin: Secondary | ICD-10-CM

## 2020-10-10 NOTE — Progress Notes (Signed)
Office Visit Note   Patient: Keith Garrett           Date of Birth: 05-Dec-1965           MRN: [id number] Visit Date: 10/10/2020              Requested by: Salvadore Farber, [place],  [place] [address] PCP: Salvadore Farber, FNP  Chief Complaint  Patient presents with  . Left Foot - Follow-up      HPI: Patient presents today for follow-up on his left lateral foot ulcer.  He has been doing a dry dressing change every day.  He was started on Cipro by Dr. Sharol Given at his last visit and has about 1 or 2 days left of treatment.  He thinks the foot looks much better.  Assessment & Plan: Visit Diagnoses: No diagnosis found.  Plan: Continue with dry dressing changes.  Washing this every day.  We will follow-up in 2 weeks.  Should complete his ciprofloxacin.  Follow-Up Instructions: No follow-ups on file.   Ortho Exam  Patient is alert, oriented, no adenopathy, well-dressed, normal affect, normal respiratory effort. Examination small resolving ulcer.  Does not probe deeply no surrounding cellulitis no foul odor minimal to no drainage.  No ascending cellulitis.  Some small amount of macerated skin  Imaging: No results found. No images are attached to the encounter.  Labs: Lab Results  Component Value Date   HGBA1C 6.7 (H) 04/15/2020   HGBA1C 6.9 (H) 04/14/2020   ESRSEDRATE 41 (H) 08/16/2020   ESRSEDRATE 140 (H) 04/14/2020   CRP 3.1 08/16/2020   CRP 21.0 (H) 04/14/2020   REPTSTATUS 04/20/2020 FINAL 04/15/2020   GRAMSTAIN  04/13/2020    RARE WBC PRESENT, PREDOMINANTLY PMN RARE GRAM POSITIVE COCCI    CULT  04/15/2020    NO GROWTH 5 DAYS Performed at [facility], [place] [address], [place], [place] [address]    LABORGA STAPHYLOCOCCUS AUREUS 04/13/2020     Lab Results  Component Value Date   ALBUMIN 1.8 (L) 04/14/2020   ALBUMIN 3.9 09/21/2009    No results found for: MG No results found for: VD25OH  No results found for: PREALBUMIN CBC  EXTENDED Latest Ref Rng & Units 08/16/2020 04/19/2020 04/18/2020  WBC 3.8 - 10.8 Thousand/uL 10.4 15.2(H) 15.4(H)  RBC 4.20 - 5.80 Million/uL 3.88(L) 2.51(L) 2.45(L)  HGB 13.2 - 17.1 g/dL 11.3(L) 7.4(L) 7.1(L)  HCT 38.5 - 50.0 % 33.9(L) 22.2(L) 21.5(L)  PLT 140 - 400 Thousand/uL 390 598(H) 545(H)  NEUTROABS 1.7 - 7.7 K/uL - - -  LYMPHSABS 0.7 - 4.0 K/uL - - -     There is no height or weight on file to calculate BMI.  Orders:  No orders of the defined types were placed in this encounter.  No orders of the defined types were placed in this encounter.    Procedures: No procedures performed  Clinical Data: No additional findings.  ROS:  All other systems negative, except as noted in the HPI. Review of Systems  Objective: Vital Signs: There were no vitals taken for this visit.  Specialty Comments:  No specialty comments available.  PMFS History: Patient Active Problem List   Diagnosis Date Noted  . Osteomyelitis of ankle or foot, acute, left ([place]) 04/19/2020  . Proteinuria 04/18/2020  . Staphylococcus aureus bacteremia 04/15/2020  . PVD (peripheral vascular disease) ([place])   . Severe protein-calorie malnutrition ([place])   . Diabetic polyneuropathy associated with type 2  diabetes mellitus ([place])   . Reactive thrombocytosis 04/14/2020  . Normocytic anemia 04/14/2020  . Hypoalbuminemia due to intermediate proteinuria  04/14/2020  . Calcification of artery 04/14/2020  . Bone marrow edema of left calcaneus 04/14/2020  . Tenosynovitis, mild, of the traversing left peroneal tendons. 04/14/2020  . Traumatic rupture of plantar fascia of left foot 04/14/2020  . Myositis of left foot 04/14/2020  . Foot ulcer, left, limited to breakdown of skin ([place]) 04/14/2020  . 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 Diabetic retinopathy ([place]) 10/10/2019  . Bilateral cataracts 10/10/2019  . Transient ischemic attack 10/10/2019   Past Medical History:  Diagnosis Date   . Cellulitis of foot, left 04/13/2020  . Diabetes mellitus without complication ([place])   . Hypertension   . Osteomyelitis of ankle or foot, acute, left ([place]) 04/19/2020  . Traumatic rupture of plantar fascia of left foot 04/14/2020    No family history on file.  Past Surgical History:  Procedure Laterality Date  . ABDOMINAL AORTOGRAM W/LOWER EXTREMITY N/A 04/17/2020   Procedure: ABDOMINAL AORTOGRAM W/LOWER EXTREMITY;  Surgeon: Marty Heck, MD;  Location: [facility];  Service: Cardiovascular;  Laterality: N/A;  . TEE WITHOUT CARDIOVERSION N/A 04/16/2020   Procedure: TRANSESOPHAGEAL ECHOCARDIOGRAM (TEE);  Surgeon: [name], MD;  Location: [facility] ENDOSCOPY;  Service: Cardiovascular;  Laterality: N/A;   Social History   Occupational History  . Not on file  Tobacco Use  . Smoking status: Never Smoker  . Smokeless tobacco: Never Used  Substance and Sexual Activity  . Alcohol use: Yes    Comment: occasionally  . Drug use: No  . Sexual activity: Not Currently

## 2020-10-31 ENCOUNTER — Ambulatory Visit (INDEPENDENT_AMBULATORY_CARE_PROVIDER_SITE_OTHER): Payer: Self-pay | Admitting: Physician Assistant

## 2020-10-31 ENCOUNTER — Encounter: Payer: Self-pay | Admitting: Orthopedic Surgery

## 2020-10-31 DIAGNOSIS — L97521 Non-pressure chronic ulcer of other part of left foot limited to breakdown of skin: Secondary | ICD-10-CM

## 2020-10-31 NOTE — Progress Notes (Signed)
Office Visit Note   Patient: Keith Garrett           Date of Birth: [birth year]           MRN: [id number] Visit Date: 10/31/2020              Requested by: Salvadore Farber, [place],  [place] [address] PCP: Salvadore Farber, FNP  Chief Complaint  Patient presents with  . Left Foot - Follow-up      HPI: Patient presents in follow-up for his lateral left foot ulcer.  He states he has not taken any antibiotics for a month.  Previous to that he was on Cipro.  He is weightbearing in a regular shoe.  He just gets a spot of serous drainage.  He feels he is significantly better  Assessment & Plan: Visit Diagnoses: No diagnosis found.  Plan: Patient would like to observe this for now.  I told him that if he has any increased drainage redness he should contact us immediately  Follow-Up Instructions: No follow-ups on file.   Ortho Exam  Patient is alert, oriented, no adenopathy, well-dressed, normal affect, normal respiratory effort. Ulcer is completely healed with the exception of one pinpoint area could not express any fluid currently.  There is no surrounding cellulitis no fluctuance no signs of acute infection  Imaging: No results found. No images are attached to the encounter.  Labs: Lab Results  Component Value Date   HGBA1C 6.7 (H) 04/15/2020   HGBA1C 6.9 (H) 04/14/2020   ESRSEDRATE 41 (H) 08/16/2020   ESRSEDRATE 140 (H) 04/14/2020   CRP 3.1 08/16/2020   CRP 21.0 (H) 04/14/2020   REPTSTATUS 04/20/2020 FINAL 04/15/2020   GRAMSTAIN  04/13/2020    RARE WBC PRESENT, PREDOMINANTLY PMN RARE GRAM POSITIVE COCCI    CULT  04/15/2020    NO GROWTH 5 DAYS Performed at [facility], [place] [address], [place], [place] [address]    LABORGA STAPHYLOCOCCUS AUREUS 04/13/2020     Lab Results  Component Value Date   ALBUMIN 1.8 (L) 04/14/2020   ALBUMIN 3.9 09/21/2009    No results found for: MG No results found for: VD25OH  No results found for:  PREALBUMIN CBC EXTENDED Latest Ref Rng & Units 08/16/2020 04/19/2020 04/18/2020  WBC 3.8 - 10.8 Thousand/uL 10.4 15.2(H) 15.4(H)  RBC 4.20 - 5.80 Million/uL 3.88(L) 2.51(L) 2.45(L)  HGB 13.2 - 17.1 g/dL 11.3(L) 7.4(L) 7.1(L)  HCT 38.5 - 50.0 % 33.9(L) 22.2(L) 21.5(L)  PLT 140 - 400 Thousand/uL 390 598(H) 545(H)  NEUTROABS 1.7 - 7.7 K/uL - - -  LYMPHSABS 0.7 - 4.0 K/uL - - -     There is no height or weight on file to calculate BMI.  Orders:  No orders of the defined types were placed in this encounter.  No orders of the defined types were placed in this encounter.    Procedures: No procedures performed  Clinical Data: No additional findings.  ROS:  All other systems negative, except as noted in the HPI. Review of Systems  Objective: Vital Signs: There were no vitals taken for this visit.  Specialty Comments:  No specialty comments available.  PMFS History: Patient Active Problem List   Diagnosis Date Noted  . Osteomyelitis of ankle or foot, acute, left ([place]) 04/19/2020  . Proteinuria 04/18/2020  . Staphylococcus aureus bacteremia 04/15/2020  . PVD (peripheral vascular disease) ([place])   . Severe protein-calorie malnutrition ([place])   . Diabetic polyneuropathy  associated with type 2 diabetes mellitus ([place])   . Reactive thrombocytosis 04/14/2020  . Normocytic anemia 04/14/2020  . Hypoalbuminemia due to intermediate proteinuria  04/14/2020  . Calcification of artery 04/14/2020  . Bone marrow edema of left calcaneus 04/14/2020  . Tenosynovitis, mild, of the traversing left peroneal tendons. 04/14/2020  . Traumatic rupture of plantar fascia of left foot 04/14/2020  . Myositis of left foot 04/14/2020  . Foot ulcer, left, limited to breakdown of skin ([place]) 04/14/2020  . 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 Diabetic retinopathy ([place]) 10/10/2019  . Bilateral cataracts 10/10/2019  . Transient ischemic attack 10/10/2019   Past Medical History:   Diagnosis Date  . Cellulitis of foot, left 04/13/2020  . Diabetes mellitus without complication ([place])   . Hypertension   . Osteomyelitis of ankle or foot, acute, left ([place]) 04/19/2020  . Traumatic rupture of plantar fascia of left foot 04/14/2020    No family history on file.  Past Surgical History:  Procedure Laterality Date  . ABDOMINAL AORTOGRAM W/LOWER EXTREMITY N/A 04/17/2020   Procedure: ABDOMINAL AORTOGRAM W/LOWER EXTREMITY;  Surgeon: Marty Heck, MD;  Location: [facility];  Service: Cardiovascular;  Laterality: N/A;  . TEE WITHOUT CARDIOVERSION N/A 04/16/2020   Procedure: TRANSESOPHAGEAL ECHOCARDIOGRAM (TEE);  Surgeon: [name], MD;  Location: [facility] ENDOSCOPY;  Service: Cardiovascular;  Laterality: N/A;   Social History   Occupational History  . Not on file  Tobacco Use  . Smoking status: Never Smoker  . Smokeless tobacco: Never Used  Substance and Sexual Activity  . Alcohol use: Yes    Comment: occasionally  . Drug use: No  . Sexual activity: Not Currently

## 2021-01-14 DIAGNOSIS — M479 Spondylosis, unspecified: Secondary | ICD-10-CM | POA: Insufficient documentation

## 2021-01-14 DIAGNOSIS — I2510 Atherosclerotic heart disease of native coronary artery without angina pectoris: Secondary | ICD-10-CM | POA: Insufficient documentation

## 2021-01-14 DIAGNOSIS — M659 Synovitis and tenosynovitis, unspecified: Secondary | ICD-10-CM | POA: Insufficient documentation

## 2021-01-14 DIAGNOSIS — S93699A Other sprain of unspecified foot, initial encounter: Secondary | ICD-10-CM | POA: Insufficient documentation

## 2021-01-29 DIAGNOSIS — E875 Hyperkalemia: Secondary | ICD-10-CM | POA: Insufficient documentation

## 2021-05-06 ENCOUNTER — Emergency Department (HOSPITAL_COMMUNITY): Payer: No Typology Code available for payment source

## 2021-05-06 ENCOUNTER — Encounter (HOSPITAL_COMMUNITY): Payer: Self-pay

## 2021-05-06 ENCOUNTER — Emergency Department (HOSPITAL_COMMUNITY)
Admission: EM | Admit: 2021-05-06 | Discharge: 2021-05-06 | Disposition: A | Discharge status: Home / Self Care | Payer: No Typology Code available for payment source | Attending: Emergency Medicine | Admitting: Emergency Medicine

## 2021-05-06 DIAGNOSIS — E1142 Type 2 diabetes mellitus with diabetic polyneuropathy: Secondary | ICD-10-CM | POA: Diagnosis not present

## 2021-05-06 DIAGNOSIS — I10 Essential (primary) hypertension: Secondary | ICD-10-CM | POA: Insufficient documentation

## 2021-05-06 DIAGNOSIS — M542 Cervicalgia: Secondary | ICD-10-CM | POA: Insufficient documentation

## 2021-05-06 DIAGNOSIS — Z7984 Long term (current) use of oral hypoglycemic drugs: Secondary | ICD-10-CM | POA: Insufficient documentation

## 2021-05-06 DIAGNOSIS — Z79899 Other long term (current) drug therapy: Secondary | ICD-10-CM | POA: Diagnosis not present

## 2021-05-06 DIAGNOSIS — Y92410 Unspecified street and highway as the place of occurrence of the external cause: Secondary | ICD-10-CM | POA: Diagnosis not present

## 2021-05-06 DIAGNOSIS — M549 Dorsalgia, unspecified: Secondary | ICD-10-CM | POA: Insufficient documentation

## 2021-05-06 DIAGNOSIS — E11319 Type 2 diabetes mellitus with unspecified diabetic retinopathy without macular edema: Secondary | ICD-10-CM | POA: Insufficient documentation

## 2021-05-06 MED ORDER — METHOCARBAMOL 500 MG PO TABS: 500.0000 mg | ORAL_TABLET | Freq: Two times a day (BID) | ORAL | 0 refills | Status: DC

## 2021-05-06 MED ORDER — LIDOCAINE 5 % EX PTCH: 1.0000 | MEDICATED_PATCH | CUTANEOUS | 0 refills | Status: DC
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
## 2021-05-12 DIAGNOSIS — R053 Chronic cough: Secondary | ICD-10-CM | POA: Insufficient documentation

## 2021-05-12 DIAGNOSIS — Z8739 Personal history of other diseases of the musculoskeletal system and connective tissue: Secondary | ICD-10-CM | POA: Insufficient documentation

## 2021-05-12 DIAGNOSIS — R600 Localized edema: Secondary | ICD-10-CM | POA: Insufficient documentation

## 2021-12-20 DIAGNOSIS — N184 Chronic kidney disease, stage 4 (severe): Secondary | ICD-10-CM

## 2021-12-20 HISTORY — DX: Chronic kidney disease, stage 4 (severe): N18.4

## 2022-01-13 DIAGNOSIS — E785 Hyperlipidemia, unspecified: Secondary | ICD-10-CM | POA: Insufficient documentation

## 2022-01-16 ENCOUNTER — Inpatient Hospital Stay (HOSPITAL_COMMUNITY): Payer: Self-pay

## 2022-01-16 ENCOUNTER — Inpatient Hospital Stay (HOSPITAL_COMMUNITY)
Admission: EM | Admit: 2022-01-16 | Discharge: 2022-01-24 | DRG: 870 | Disposition: A | Discharge status: Home / Self Care | Payer: Self-pay | Attending: Internal Medicine | Admitting: Internal Medicine

## 2022-01-16 ENCOUNTER — Emergency Department (HOSPITAL_COMMUNITY): Payer: Self-pay

## 2022-01-16 DIAGNOSIS — N179 Acute kidney failure, unspecified: Secondary | ICD-10-CM

## 2022-01-16 DIAGNOSIS — E861 Hypovolemia: Secondary | ICD-10-CM | POA: Diagnosis present

## 2022-01-16 DIAGNOSIS — G9341 Metabolic encephalopathy: Secondary | ICD-10-CM | POA: Diagnosis present

## 2022-01-16 DIAGNOSIS — E871 Hypo-osmolality and hyponatremia: Secondary | ICD-10-CM | POA: Diagnosis present

## 2022-01-16 DIAGNOSIS — D509 Iron deficiency anemia, unspecified: Secondary | ICD-10-CM | POA: Diagnosis present

## 2022-01-16 DIAGNOSIS — D6489 Other specified anemias: Secondary | ICD-10-CM | POA: Diagnosis present

## 2022-01-16 DIAGNOSIS — E875 Hyperkalemia: Secondary | ICD-10-CM

## 2022-01-16 DIAGNOSIS — E872 Acidosis, unspecified: Secondary | ICD-10-CM

## 2022-01-16 DIAGNOSIS — Z20822 Contact with and (suspected) exposure to covid-19: Secondary | ICD-10-CM | POA: Diagnosis present

## 2022-01-16 DIAGNOSIS — E669 Obesity, unspecified: Secondary | ICD-10-CM | POA: Diagnosis present

## 2022-01-16 DIAGNOSIS — Z79899 Other long term (current) drug therapy: Secondary | ICD-10-CM

## 2022-01-16 DIAGNOSIS — E1122 Type 2 diabetes mellitus with diabetic chronic kidney disease: Secondary | ICD-10-CM | POA: Diagnosis present

## 2022-01-16 DIAGNOSIS — E11649 Type 2 diabetes mellitus with hypoglycemia without coma: Secondary | ICD-10-CM | POA: Diagnosis not present

## 2022-01-16 DIAGNOSIS — N184 Chronic kidney disease, stage 4 (severe): Secondary | ICD-10-CM

## 2022-01-16 DIAGNOSIS — R6521 Severe sepsis with septic shock: Secondary | ICD-10-CM | POA: Diagnosis present

## 2022-01-16 DIAGNOSIS — Z6831 Body mass index (BMI) 31.0-31.9, adult: Secondary | ICD-10-CM

## 2022-01-16 DIAGNOSIS — I129 Hypertensive chronic kidney disease with stage 1 through stage 4 chronic kidney disease, or unspecified chronic kidney disease: Secondary | ICD-10-CM | POA: Diagnosis present

## 2022-01-16 DIAGNOSIS — J9600 Acute respiratory failure, unspecified whether with hypoxia or hypercapnia: Secondary | ICD-10-CM

## 2022-01-16 DIAGNOSIS — Z7984 Long term (current) use of oral hypoglycemic drugs: Secondary | ICD-10-CM

## 2022-01-16 DIAGNOSIS — E876 Hypokalemia: Secondary | ICD-10-CM | POA: Diagnosis not present

## 2022-01-16 DIAGNOSIS — E1165 Type 2 diabetes mellitus with hyperglycemia: Secondary | ICD-10-CM | POA: Diagnosis present

## 2022-01-16 DIAGNOSIS — J9811 Atelectasis: Secondary | ICD-10-CM | POA: Diagnosis present

## 2022-01-16 DIAGNOSIS — A419 Sepsis, unspecified organism: Principal | ICD-10-CM | POA: Diagnosis present

## 2022-01-16 DIAGNOSIS — G9340 Encephalopathy, unspecified: Principal | ICD-10-CM

## 2022-01-16 DIAGNOSIS — E1136 Type 2 diabetes mellitus with diabetic cataract: Secondary | ICD-10-CM | POA: Diagnosis present

## 2022-01-16 DIAGNOSIS — J9602 Acute respiratory failure with hypercapnia: Secondary | ICD-10-CM | POA: Diagnosis present

## 2022-01-16 DIAGNOSIS — N182 Chronic kidney disease, stage 2 (mild): Secondary | ICD-10-CM

## 2022-01-16 DIAGNOSIS — J9601 Acute respiratory failure with hypoxia: Secondary | ICD-10-CM | POA: Diagnosis present

## 2022-01-16 DIAGNOSIS — N1832 Chronic kidney disease, stage 3b: Secondary | ICD-10-CM

## 2022-01-16 DIAGNOSIS — R571 Hypovolemic shock: Secondary | ICD-10-CM | POA: Diagnosis present

## 2022-01-16 DIAGNOSIS — D649 Anemia, unspecified: Secondary | ICD-10-CM | POA: Diagnosis present

## 2022-01-16 DIAGNOSIS — N170 Acute kidney failure with tubular necrosis: Secondary | ICD-10-CM | POA: Diagnosis present

## 2022-01-16 DIAGNOSIS — H269 Unspecified cataract: Secondary | ICD-10-CM | POA: Diagnosis present

## 2022-01-16 DIAGNOSIS — J690 Pneumonitis due to inhalation of food and vomit: Secondary | ICD-10-CM | POA: Diagnosis present

## 2022-01-16 HISTORY — DX: Chronic kidney disease, stage 4 (severe): N18.4

## 2022-01-16 LAB — I-STAT ARTERIAL BLOOD GAS, ED
Acid-base deficit: 26 mmol/L — ABNORMAL HIGH (ref 0.0–2.0)
Bicarbonate: 6.2 mmol/L — ABNORMAL LOW (ref 20.0–28.0)
Calcium, Ion: 1.05 mmol/L — ABNORMAL LOW (ref 1.15–1.40)
HCT: 23 % — ABNORMAL LOW (ref 39.0–52.0)
Hemoglobin: 7.8 g/dL — ABNORMAL LOW (ref 13.0–17.0)
O2 Saturation: 100 %
Potassium: 6.5 mmol/L (ref 3.5–5.1)
Sodium: 134 mmol/L — ABNORMAL LOW (ref 135–145)
TCO2: 7 mmol/L — ABNORMAL LOW (ref 22–32)
pCO2 arterial: 37.2 mmHg (ref 32–48)
pH, Arterial: 6.827 — CL (ref 7.35–7.45)
pO2, Arterial: 312 mmHg — ABNORMAL HIGH (ref 83–108)

## 2022-01-16 LAB — POCT I-STAT 7, (LYTES, BLD GAS, ICA,H+H)
Acid-base deficit: 26 mmol/L — ABNORMAL HIGH (ref 0.0–2.0)
Acid-base deficit: 23 mmol/L — ABNORMAL HIGH (ref 0.0–2.0)
Bicarbonate: 5.4 mmol/L — ABNORMAL LOW (ref 20.0–28.0)
Bicarbonate: 6.8 mmol/L — ABNORMAL LOW (ref 20.0–28.0)
Calcium, Ion: 0.99 mmol/L — ABNORMAL LOW (ref 1.15–1.40)
Calcium, Ion: 0.98 mmol/L — ABNORMAL LOW (ref 1.15–1.40)
HCT: 24 % — ABNORMAL LOW (ref 39.0–52.0)
HCT: 25 % — ABNORMAL LOW (ref 39.0–52.0)
Hemoglobin: 8.2 g/dL — ABNORMAL LOW (ref 13.0–17.0)
Hemoglobin: 8.5 g/dL — ABNORMAL LOW (ref 13.0–17.0)
O2 Saturation: 92 %
O2 Saturation: 90 %
Patient temperature: 35.1
Patient temperature: 35.7
Potassium: 6 mmol/L — ABNORMAL HIGH (ref 3.5–5.1)
Potassium: 5.6 mmol/L — ABNORMAL HIGH (ref 3.5–5.1)
Sodium: 135 mmol/L (ref 135–145)
Sodium: 136 mmol/L (ref 135–145)
TCO2: 6 mmol/L — ABNORMAL LOW (ref 22–32)
TCO2: 8 mmol/L — ABNORMAL LOW (ref 22–32)
pCO2 arterial: 25.7 mmHg — ABNORMAL LOW (ref 32–48)
pCO2 arterial: 27.2 mmHg — ABNORMAL LOW (ref 32–48)
pH, Arterial: 6.916 — CL (ref 7.35–7.45)
pH, Arterial: 7 — CL (ref 7.35–7.45)
pO2, Arterial: 93 mmHg (ref 83–108)
pO2, Arterial: 81 mmHg — ABNORMAL LOW (ref 83–108)

## 2022-01-16 LAB — CBC WITH DIFFERENTIAL/PLATELET
Abs Immature Granulocytes: 0.1 10*3/uL — ABNORMAL HIGH (ref 0.00–0.07)
Basophils Absolute: 0 10*3/uL (ref 0.0–0.1)
Basophils Relative: 0 %
Eosinophils Absolute: 0 10*3/uL (ref 0.0–0.5)
Eosinophils Relative: 0 %
HCT: 27.6 % — ABNORMAL LOW (ref 39.0–52.0)
Hemoglobin: 8.6 g/dL — ABNORMAL LOW (ref 13.0–17.0)
Immature Granulocytes: 1 %
Lymphocytes Relative: 15 %
Lymphs Abs: 2 10*3/uL (ref 0.7–4.0)
MCH: 30.6 pg (ref 26.0–34.0)
MCHC: 31.2 g/dL (ref 30.0–36.0)
MCV: 98.2 fL (ref 80.0–100.0)
Monocytes Absolute: 0.4 10*3/uL (ref 0.1–1.0)
Monocytes Relative: 3 %
Neutro Abs: 11.3 10*3/uL — ABNORMAL HIGH (ref 1.7–7.7)
Neutrophils Relative %: 81 %
Platelets: 265 10*3/uL (ref 150–400)
RBC: 2.81 MIL/uL — ABNORMAL LOW (ref 4.22–5.81)
RDW: 13.1 % (ref 11.5–15.5)
WBC: 13.9 10*3/uL — ABNORMAL HIGH (ref 4.0–10.5)
nRBC: 0 % (ref 0.0–0.2)

## 2022-01-16 LAB — COMPREHENSIVE METABOLIC PANEL
ALT: 12 U/L (ref 0–44)
AST: 20 U/L (ref 15–41)
Albumin: 3.1 g/dL — ABNORMAL LOW (ref 3.5–5.0)
Alkaline Phosphatase: 78 U/L (ref 38–126)
BUN: 102 mg/dL — ABNORMAL HIGH (ref 6–20)
CO2: <7 mmol/L — ABNORMAL LOW (ref 22–32)
Calcium: 7.7 mg/dL — ABNORMAL LOW (ref 8.9–10.3)
Chloride: 107 mmol/L (ref 98–111)
Creatinine, Ser: 12.61 mg/dL — ABNORMAL HIGH (ref 0.61–1.24)
GFR, Estimated: 4 mL/min — ABNORMAL LOW (ref >60)
Glucose, Bld: 142 mg/dL — ABNORMAL HIGH (ref 70–99)
Potassium: 6.8 mmol/L (ref 3.5–5.1)
Sodium: 136 mmol/L (ref 135–145)
Total Bilirubin: 1.3 mg/dL — ABNORMAL HIGH (ref 0.3–1.2)
Total Protein: 6.4 g/dL — ABNORMAL LOW (ref 6.5–8.1)

## 2022-01-16 LAB — BASIC METABOLIC PANEL
BUN: 97 mg/dL — ABNORMAL HIGH (ref 6–20)
CO2: <7 mmol/L — ABNORMAL LOW (ref 22–32)
Calcium: 7.3 mg/dL — ABNORMAL LOW (ref 8.9–10.3)
Chloride: 104 mmol/L (ref 98–111)
Creatinine, Ser: 12.03 mg/dL — ABNORMAL HIGH (ref 0.61–1.24)
GFR, Estimated: 4 mL/min — ABNORMAL LOW (ref >60)
Glucose, Bld: 216 mg/dL — ABNORMAL HIGH (ref 70–99)
Potassium: 6.2 mmol/L — ABNORMAL HIGH (ref 3.5–5.1)
Sodium: 138 mmol/L (ref 135–145)

## 2022-01-16 LAB — I-STAT CHEM 8, ED
BUN: 120 mg/dL — ABNORMAL HIGH (ref 6–20)
Calcium, Ion: 1.03 mmol/L — ABNORMAL LOW (ref 1.15–1.40)
Chloride: 109 mmol/L (ref 98–111)
Creatinine, Ser: 15.7 mg/dL — ABNORMAL HIGH (ref 0.61–1.24)
Glucose, Bld: 133 mg/dL — ABNORMAL HIGH (ref 70–99)
HCT: 25 % — ABNORMAL LOW (ref 39.0–52.0)
Hemoglobin: 8.5 g/dL — ABNORMAL LOW (ref 13.0–17.0)
Potassium: 6.5 mmol/L (ref 3.5–5.1)
Sodium: 135 mmol/L (ref 135–145)
TCO2: 7 mmol/L — ABNORMAL LOW (ref 22–32)

## 2022-01-16 LAB — LACTIC ACID, PLASMA
Lactic Acid, Venous: >9 mmol/L (ref 0.5–1.9)
Lactic Acid, Venous: >9 mmol/L (ref 0.5–1.9)

## 2022-01-16 LAB — PROTIME-INR
INR: 1.2 (ref 0.8–1.2)
Prothrombin Time: 15.5 seconds — ABNORMAL HIGH (ref 11.4–15.2)

## 2022-01-16 LAB — MAGNESIUM: Magnesium: 2.4 mg/dL (ref 1.7–2.4)

## 2022-01-16 LAB — TROPONIN I (HIGH SENSITIVITY)
Troponin I (High Sensitivity): 12 ng/L (ref <18)
Troponin I (High Sensitivity): 39 ng/L — ABNORMAL HIGH (ref <18)

## 2022-01-16 LAB — PHOSPHORUS: Phosphorus: 9.7 mg/dL — ABNORMAL HIGH (ref 2.5–4.6)

## 2022-01-16 LAB — CK: Total CK: 129 U/L (ref 49–397)

## 2022-01-16 LAB — ACETAMINOPHEN LEVEL: Acetaminophen (Tylenol), Serum: <10 ug/mL — ABNORMAL LOW (ref 10–30)

## 2022-01-16 LAB — RESP PANEL BY RT-PCR (FLU A&B, COVID) ARPGX2
Influenza A by PCR: NEGATIVE
Influenza B by PCR: NEGATIVE
SARS Coronavirus 2 by RT PCR: NEGATIVE

## 2022-01-16 LAB — HEMOGLOBIN A1C
Hgb A1c MFr Bld: 6.9 % — ABNORMAL HIGH (ref 4.8–5.6)
Mean Plasma Glucose: 151.33 mg/dL

## 2022-01-16 LAB — HIV ANTIBODY (ROUTINE TESTING W REFLEX): HIV Screen 4th Generation wRfx: NONREACTIVE

## 2022-01-16 LAB — PROCALCITONIN: Procalcitonin: 0.25 ng/mL

## 2022-01-16 LAB — ETHANOL: Alcohol, Ethyl (B): <10 mg/dL (ref <10)

## 2022-01-16 LAB — MRSA NEXT GEN BY PCR, NASAL: MRSA by PCR Next Gen: NOT DETECTED

## 2022-01-16 MED ORDER — NOREPINEPHRINE 16 MG/250ML-% IV SOLN
0.0000 ug/min | INTRAVENOUS | Status: DC
  Administered 2022-01-17: 60 ug/min via INTRAVENOUS
  Administered 2022-01-17: 40 ug/min via INTRAVENOUS
  Administered 2022-01-17: 10 ug/min via INTRAVENOUS
  Administered 2022-01-18: 7 ug/min via INTRAVENOUS
  Administered 2022-01-20: 0.5 ug/min via INTRAVENOUS
  Filled 2022-01-16 (×5): qty 250

## 2022-01-16 MED ORDER — ROCURONIUM BROMIDE 50 MG/5ML IV SOLN
INTRAVENOUS | Status: AC | PRN
  Administered 2022-01-16: 100 mg via INTRAVENOUS

## 2022-01-16 MED ORDER — HEPARIN SODIUM (PORCINE) 5000 UNIT/ML IJ SOLN
5000.0000 [IU] | Freq: Three times a day (TID) | INTRAMUSCULAR | Status: DC
  Administered 2022-01-17 – 2022-01-18 (×3): 5000 [IU] via SUBCUTANEOUS
  Filled 2022-01-16 (×4): qty 1

## 2022-01-16 MED ORDER — SODIUM CHLORIDE 0.9 % IV BOLUS
1000.0000 mL | Freq: Once | INTRAVENOUS | Status: AC
Start: 2022-01-16 — End: 2022-01-16
  Administered 2022-01-16: 1000 mL via INTRAVENOUS

## 2022-01-16 MED ORDER — AMPICILLIN-SULBACTAM SODIUM 3 (2-1) G IJ SOLR: 3.0000 g | Freq: Once | INTRAMUSCULAR | Status: DC

## 2022-01-16 MED ORDER — NOREPINEPHRINE 4 MG/250ML-% IV SOLN
0.0000 ug/min | INTRAVENOUS | Status: DC
  Administered 2022-01-16: 50 ug/min via INTRAVENOUS
  Administered 2022-01-16: 30 ug/min via INTRAVENOUS
  Filled 2022-01-16: qty 250

## 2022-01-16 MED ORDER — DORZOLAMIDE HCL-TIMOLOL MAL 2-0.5 % OP SOLN
1.0000 [drp] | Freq: Two times a day (BID) | OPHTHALMIC | Status: DC
  Administered 2022-01-17 – 2022-01-24 (×15): 1 [drp] via OPHTHALMIC
  Filled 2022-01-16 (×3): qty 10

## 2022-01-16 MED ORDER — INSULIN ASPART 100 UNIT/ML IV SOLN
5.0000 [IU] | Freq: Once | INTRAVENOUS | Status: AC
  Administered 2022-01-16: 5 [IU] via INTRAVENOUS

## 2022-01-16 MED ORDER — DOCUSATE SODIUM 50 MG/5ML PO LIQD: 100.0000 mg | Freq: Two times a day (BID) | ORAL | Status: DC | PRN

## 2022-01-16 MED ORDER — SODIUM ZIRCONIUM CYCLOSILICATE 10 G PO PACK
10.0000 g | PACK | ORAL | Status: AC
Start: 2022-01-16 — End: 2022-01-17

## 2022-01-16 MED ORDER — SODIUM CHLORIDE 0.9 % IV BOLUS
1000.0000 mL | Freq: Once | INTRAVENOUS | Status: AC
  Administered 2022-01-16: 1000 mL via INTRAVENOUS

## 2022-01-16 MED ORDER — STERILE WATER FOR INJECTION IV SOLN
INTRAVENOUS | Status: DC
  Filled 2022-01-16 (×11): qty 150

## 2022-01-16 MED ORDER — VASOPRESSIN 20 UNITS/100 ML INFUSION FOR SHOCK
0.0000 [IU]/min | INTRAVENOUS | Status: DC
  Administered 2022-01-17 – 2022-01-19 (×4): 0.03 [IU]/min via INTRAVENOUS
  Filled 2022-01-16 (×5): qty 100

## 2022-01-16 MED ORDER — PANTOPRAZOLE 2 MG/ML SUSPENSION: 40.0000 mg | Freq: Every day | ORAL | Status: DC

## 2022-01-16 MED ORDER — SODIUM CHLORIDE 0.9 % IV SOLN: INTRAVENOUS | Status: DC | PRN

## 2022-01-16 MED ORDER — PANTOPRAZOLE SODIUM 40 MG IV SOLR
40.0000 mg | Freq: Every day | INTRAVENOUS | Status: DC
  Administered 2022-01-17 – 2022-01-22 (×6): 40 mg via INTRAVENOUS
  Filled 2022-01-16 (×6): qty 10

## 2022-01-16 MED ORDER — FENTANYL CITRATE PF 50 MCG/ML IJ SOSY
50.0000 ug | PREFILLED_SYRINGE | INTRAMUSCULAR | Status: DC | PRN
  Filled 2022-01-16: qty 1

## 2022-01-16 MED ORDER — DEXTROSE 50 % IV SOLN
1.0000 | Freq: Once | INTRAVENOUS | Status: AC
  Administered 2022-01-16: 50 mL via INTRAVENOUS
  Filled 2022-01-16: qty 50

## 2022-01-16 MED ORDER — INSULIN ASPART 100 UNIT/ML IJ SOLN
0.0000 [IU] | INTRAMUSCULAR | Status: DC
  Administered 2022-01-17: 7 [IU] via SUBCUTANEOUS
  Administered 2022-01-17: 4 [IU] via SUBCUTANEOUS
  Administered 2022-01-17: 7 [IU] via SUBCUTANEOUS
  Administered 2022-01-18: 4 [IU] via SUBCUTANEOUS
  Administered 2022-01-18: 11 [IU] via SUBCUTANEOUS
  Administered 2022-01-18: 4 [IU] via SUBCUTANEOUS
  Administered 2022-01-18 (×3): 7 [IU] via SUBCUTANEOUS
  Administered 2022-01-19 (×2): 4 [IU] via SUBCUTANEOUS
  Administered 2022-01-19: 7 [IU] via SUBCUTANEOUS
  Administered 2022-01-19: 3 [IU] via SUBCUTANEOUS
  Administered 2022-01-19 – 2022-01-20 (×3): 4 [IU] via SUBCUTANEOUS
  Administered 2022-01-20: 3 [IU] via SUBCUTANEOUS
  Administered 2022-01-20 – 2022-01-21 (×4): 4 [IU] via SUBCUTANEOUS
  Administered 2022-01-21: 7 [IU] via SUBCUTANEOUS
  Administered 2022-01-21: 4 [IU] via SUBCUTANEOUS
  Administered 2022-01-22 (×2): 3 [IU] via SUBCUTANEOUS
  Administered 2022-01-23 (×2): 4 [IU] via SUBCUTANEOUS
  Administered 2022-01-23: 7 [IU] via SUBCUTANEOUS

## 2022-01-16 MED ORDER — SODIUM BICARBONATE 8.4 % IV SOLN
INTRAVENOUS | Status: AC
  Administered 2022-01-16: 50 meq via INTRAVENOUS
  Filled 2022-01-16: qty 100

## 2022-01-16 MED ORDER — FENTANYL 2500MCG IN NS 250ML (10MCG/ML) PREMIX INFUSION
0.0000 ug/h | INTRAVENOUS | Status: DC
  Administered 2022-01-16: 25 ug/h via INTRAVENOUS
  Administered 2022-01-17: 250 ug/h via INTRAVENOUS
  Administered 2022-01-17: 300 ug/h via INTRAVENOUS
  Administered 2022-01-18 (×3): 400 ug/h via INTRAVENOUS
  Administered 2022-01-19: 300 ug/h via INTRAVENOUS
  Administered 2022-01-19: 400 ug/h via INTRAVENOUS
  Administered 2022-01-19: 350 ug/h via INTRAVENOUS
  Filled 2022-01-16 (×11): qty 250

## 2022-01-16 MED ORDER — ALBUMIN HUMAN 5 % IV SOLN
25.0000 g | Freq: Once | INTRAVENOUS | Status: AC
  Administered 2022-01-16: 25 g via INTRAVENOUS
  Filled 2022-01-16: qty 500

## 2022-01-16 MED ORDER — CALCIUM GLUCONATE-NACL 2-0.675 GM/100ML-% IV SOLN
2.0000 g | Freq: Once | INTRAVENOUS | Status: AC
  Administered 2022-01-16: 2000 mg via INTRAVENOUS
  Filled 2022-01-16: qty 100

## 2022-01-16 MED ORDER — NOREPINEPHRINE 4 MG/250ML-% IV SOLN
2.0000 ug/min | INTRAVENOUS | Status: DC
  Administered 2022-01-16: 5 ug/min via INTRAVENOUS
  Filled 2022-01-16 (×2): qty 250

## 2022-01-16 MED ORDER — STERILE WATER FOR INJECTION IV SOLN
INTRAVENOUS | Status: DC
  Filled 2022-01-16 (×3): qty 1000

## 2022-01-16 MED ORDER — SODIUM CHLORIDE 0.9 % IV SOLN
3.0000 g | Freq: Three times a day (TID) | INTRAVENOUS | Status: DC
  Administered 2022-01-16 – 2022-01-18 (×5): 3 g via INTRAVENOUS
  Filled 2022-01-16 (×5): qty 8

## 2022-01-16 MED ORDER — SODIUM CHLORIDE 0.9 % FOR CRRT: INTRAVENOUS_CENTRAL | Status: DC | PRN

## 2022-01-16 MED ORDER — ETOMIDATE 2 MG/ML IV SOLN
INTRAVENOUS | Status: AC | PRN
  Administered 2022-01-16: 30 mg via INTRAVENOUS

## 2022-01-16 MED ORDER — CALCIUM GLUCONATE-NACL 1-0.675 GM/50ML-% IV SOLN
1.0000 g | Freq: Once | INTRAVENOUS | Status: AC
Start: 2022-01-16 — End: 2022-01-16
  Administered 2022-01-16: 1000 mg via INTRAVENOUS
  Filled 2022-01-16: qty 50

## 2022-01-16 MED ORDER — HEPARIN SODIUM (PORCINE) 1000 UNIT/ML DIALYSIS
1000.0000 [IU] | INTRAMUSCULAR | Status: DC | PRN
  Administered 2022-01-20: 2400 [IU] via INTRAVENOUS_CENTRAL
  Filled 2022-01-16: qty 6
  Filled 2022-01-16: qty 4

## 2022-01-16 MED ORDER — MIDAZOLAM HCL 2 MG/2ML IJ SOLN: 2.0000 mg | INTRAMUSCULAR | Status: DC | PRN

## 2022-01-16 MED ORDER — MIDAZOLAM HCL 2 MG/2ML IJ SOLN
2.0000 mg | INTRAMUSCULAR | Status: DC | PRN
Start: 2022-01-16 — End: 2022-01-16
  Administered 2022-01-17: 2 mg via INTRAVENOUS

## 2022-01-16 MED ORDER — ORAL CARE MOUTH RINSE
15.0000 mL | OROMUCOSAL | Status: DC
  Administered 2022-01-16 – 2022-01-21 (×56): 15 mL via OROMUCOSAL

## 2022-01-16 MED ORDER — PRISMASOL BGK 0/2.5 32-2.5 MEQ/L EC SOLN
Status: DC
  Filled 2022-01-16 (×17): qty 5000

## 2022-01-16 MED ORDER — ORAL CARE MOUTH RINSE
15.0000 mL | OROMUCOSAL | Status: DC | PRN
Start: 2022-01-16 — End: 2022-01-24

## 2022-01-16 MED ORDER — FENTANYL CITRATE PF 50 MCG/ML IJ SOSY
50.0000 ug | PREFILLED_SYRINGE | INTRAMUSCULAR | Status: DC | PRN
  Administered 2022-01-16: 100 ug via INTRAVENOUS

## 2022-01-16 MED ORDER — VASOPRESSIN 20 UNITS/100 ML INFUSION FOR SHOCK
INTRAVENOUS | Status: AC
  Administered 2022-01-16: 0.03 [IU]/min via INTRAVENOUS
  Filled 2022-01-16: qty 100

## 2022-01-16 MED ORDER — CALCIUM GLUCONATE 10 % IV SOLN
2.0000 g | Freq: Once | INTRAVENOUS | Status: DC
  Filled 2022-01-16: qty 20

## 2022-01-16 MED ORDER — SODIUM BICARBONATE 8.4 % IV SOLN
50.0000 meq | Freq: Once | INTRAVENOUS | Status: AC
  Administered 2022-01-16: 50 meq via INTRAVENOUS

## 2022-01-16 MED ORDER — SODIUM BICARBONATE 8.4 % IV SOLN
50.0000 meq | Freq: Once | INTRAVENOUS | Status: AC
  Administered 2022-01-16: 50 meq via INTRAVENOUS
  Filled 2022-01-16: qty 200

## 2022-01-16 MED ORDER — CALCIUM GLUCONATE-NACL 1-0.675 GM/50ML-% IV SOLN
INTRAVENOUS | Status: AC
  Filled 2022-01-16: qty 50

## 2022-01-16 MED ORDER — POLYETHYLENE GLYCOL 3350 17 G PO PACK
17.0000 g | PACK | Freq: Every day | ORAL | Status: DC | PRN
Start: 2022-01-16 — End: 2022-01-23

## 2022-01-16 MED ORDER — LACTATED RINGERS IV BOLUS
1000.0000 mL | Freq: Once | INTRAVENOUS | Status: AC
  Administered 2022-01-16: 1000 mL via INTRAVENOUS

## 2022-01-16 MED ORDER — FENTANYL CITRATE PF 50 MCG/ML IJ SOSY
50.0000 ug | PREFILLED_SYRINGE | INTRAMUSCULAR | Status: DC | PRN
  Administered 2022-01-16: 50 ug via INTRAVENOUS

## 2022-01-16 MED ORDER — SODIUM CHLORIDE 0.9 % IV SOLN: 250.0000 mL | INTRAVENOUS | Status: DC

## 2022-01-16 MED ORDER — SODIUM BICARBONATE 8.4 % IV SOLN
150.0000 meq | Freq: Once | INTRAVENOUS | Status: AC
  Administered 2022-01-16: 150 meq via INTRAVENOUS

## 2022-01-16 MED ORDER — PRISMASOL BGK 0/2.5 32-2.5 MEQ/L EC SOLN
Status: DC
  Filled 2022-01-16 (×4): qty 5000

## 2022-01-16 MED ORDER — LORAZEPAM 2 MG/ML IJ SOLN
2.0000 mg | Freq: Once | INTRAMUSCULAR | Status: AC
  Administered 2022-01-16: 2 mg via INTRAVENOUS

## 2022-01-16 NOTE — ED Notes (Signed)
4 amps bicarb given per verbal order from [place] MD

## 2022-01-16 NOTE — Progress Notes (Addendum)
eLink Physician-Brief Progress Note Patient Name: Keith Garrett DOB: 1965/12/10 MRN: [id number]   Date of Service  01/16/2022  HPI/Events of Note  56-y/o M found down 7/28.  Had been feeling poorly for about 2 days prior after eating [place] food for lunch.  On 7/27 had been vomiting. In ER his GCS was 5, he was not able to protect his airway, and was subsequently intubated for airway protection. Found to have profound renal failure.   Initial potassium 6.5 BUN 120 creatinine 15.7 baseline 1.18.  Bicarbonate 7, sodium 135 White blood cell count 13.9 hemoglobin 8.6 initial arterial blood gas 6.83, PCO2 37 PO2 312 HCO3 6.2.  He got  2 L of sodium chloride, IV insulin, D50, and bicarbonate infusion  Initially it was hoped he could turn around with Bicarb infusion, later CRRT was started- E-Link called as he became hypotensive at initial flow rate 267ml/hr   eICU Interventions  AKI/uremia and profound metabolic acidosis- (CKD stage II baseline) -Sodium bicarbonate infusion, check stat labs, given 1 amp bicarb now  -cut down blood flow rate to 68ml/hr- seems to have helped  -Albumin IV prn MAP < 65    Acute respiratory failure  ventilator support Minute ventilation adjusted to provide respiratory compensation - need to limit autoPEEP which will worsen his BP  Sedation with Fentanyl infusion added    Circulatory shock. Volume depletion; r/o sepsis, + Fluid shifts on CRRT  Norepi for MAP > 65 Adding vasopressin now Stat Bicarb, stat Ca Gluconate  Albumin prn for MAP < 65  Reduce flow rate on CRRT to 5ml/min for now, will try to gradually bring him up again         [facility] Diona Peregoy 01/16/2022, 10:47 PM

## 2022-01-16 NOTE — Progress Notes (Addendum)
Pt transported from [place] to [place] without any complications. RN at bedside.

## 2022-01-16 NOTE — Progress Notes (Signed)
An USGPIV (ultrasound guided PIV) has been placed for short-term vasopressor infusion. A correctly placed ivWatch must be used when administering Vasopressors. Should this treatment be needed beyond 72 hours, central line access should be obtained.  It will be the responsibility of the bedside nurse to follow best practice to prevent extravasations.   ?

## 2022-01-16 NOTE — Procedures (Signed)
Arterial Catheter Insertion Procedure Note  Keith Garrett  [id number]  1966/01/21  Date:01/16/22  Time:9:40 PM    Provider Performing: Carrington Clamp A    Procedure: Insertion of Arterial Line [phone]) without US guidance  Indication(s) Blood pressure monitoring and/or need for frequent ABGs  Consent Unable to obtain consent due to emergent nature of procedure.  Anesthesia None   Time Out Verified patient identification, verified procedure, site/side was marked, verified correct patient position, special equipment/implants available, medications/allergies/relevant history reviewed, required imaging and test results available.   Sterile Technique Maximal sterile technique including full sterile barrier drape, hand hygiene, sterile gown, sterile gloves, mask, hair covering, sterile ultrasound probe cover (if used).   Procedure Description Area of catheter insertion was cleaned with chlorhexidine and draped in sterile fashion. Without real-time ultrasound guidance an arterial catheter was placed into the right radial artery.  Appropriate arterial tracings confirmed on monitor.     Complications/Tolerance None; patient tolerated the procedure well.   EBL Minimal   Specimen(s) None

## 2022-01-16 NOTE — ED Provider Notes (Signed)
[facility] EMERGENCY DEPARTMENT Provider Note   CSN: [id number] Arrival date & time: 01/16/22  1703  LEVEL 5 CAVEAT - ALTERED MENTAL STATUS   History  Chief Complaint  Patient presents with   unresponsive    Keith Garrett is a 56 y.o. male.  HPI 56 year old male presents with AMS. History initially from EMS.  Patient was found unresponsive by family.  When EMS got there he was facedown on the floor.  Has been unresponsive until just prior to getting here and he became awake but agitated and not following commands.  He is on a nonrebreather but is unsure what his initial O2 was.  No other history available.  Family did not report drug abuse.  Patient speaks Spanish and so there is a language barrier for EMS as well.  Son arrived later and noted that the patient started feeling poorly and having some shaking after [place] food 2 days ago for lunch.  Yesterday/this morning he was vomiting a lot.  Unclear if he was having diarrhea.  He did not see the patient this afternoon and was not the one to call EMS.  Home Medications Prior to Admission medications   Medication Sig Start Date End Date Taking? Authorizing Provider  amLODipine (NORVASC) 10 MG tablet Take 10 mg by mouth daily. 02/29/20   [provider]  atorvastatin (LIPITOR) 40 MG tablet Take 1 tablet (40 mg total) by mouth daily. 04/20/20   Benay Pike, MD  cephALEXin (KEFLEX) 500 MG capsule Take 1 capsule (500 mg total) by mouth 4 (four) times daily. 08/16/20   Mignon Pine, DO  ciprofloxacin (CIPRO) 500 MG tablet Take 1 tablet (500 mg total) by mouth 2 (two) times daily. 09/26/20   Newt Minion, MD  dorzolamide-timolol (COSOPT) 22.3-6.8 MG/ML ophthalmic solution Place 1 drop into the left eye 2 (two) times daily.  12/27/19   [provider]  glipiZIDE (GLUCOTROL) 10 MG tablet Take 10 mg by mouth daily. 02/29/20   [provider]  lidocaine (LIDODERM) 5 % Place 1 patch onto the skin  daily. Remove & Discard patch within 12 hours or as directed by MD 05/06/21   Redwine, Madison A, PA-C  lisinopril (ZESTRIL) 40 MG tablet Take 40 mg by mouth daily. 02/29/20   [provider]  metFORMIN (GLUCOPHAGE) 1000 MG tablet Take 1,000 mg by mouth 2 (two) times daily. 02/29/20   [provider]  methocarbamol (ROBAXIN) 500 MG tablet Take 1 tablet (500 mg total) by mouth 2 (two) times daily. 05/06/21   Redwine, Madison A, PA-C      Allergies    Patient has no known allergies.    Review of Systems   Review of Systems  Unable to perform ROS: Mental status change    Physical Exam Updated Vital Signs BP (!) 91/45   Pulse 88   Temp (!) 93.6 F (34.2 C)   Resp (!) 32   SpO2 94%  Physical Exam Vitals and nursing note reviewed.  Constitutional:      Appearance: He is well-developed. He is ill-appearing.  HENT:     Head: Normocephalic and atraumatic.  Eyes:     Comments: Patient seems to have a slightly oblong shaped right pupil.  Otherwise pupils are midsized  Cardiovascular:     Rate and Rhythm: Regular rhythm. Tachycardia present.     Heart sounds: Normal heart sounds.  Pulmonary:     Effort: Pulmonary effort is normal. Tachypnea present.  Breath sounds: Normal breath sounds.  Abdominal:     General: There is no distension.     Palpations: Abdomen is soft.  Musculoskeletal:     Cervical back: No rigidity.  Skin:    General: Skin is warm and dry.  Neurological:     GCS: GCS eye subscore is 1. GCS verbal subscore is 2. GCS motor subscore is 5.     Comments: Patient seems to be awake but is altered and not following commands.  Is persistently trying to move his body from his left side to lay on his right side.  Seems to move all 4 extremities though is not following commands or responding to name     ED Results / Procedures / Treatments   Labs (all labs ordered are listed, but only abnormal results are displayed) Labs Reviewed  ACETAMINOPHEN LEVEL -  Abnormal; Notable for the following components:      Result Value   Acetaminophen (Tylenol), Serum <10 (*)    All other components within normal limits  LACTIC ACID, PLASMA - Abnormal; Notable for the following components:   Lactic Acid, Venous >9.0 (*)    All other components within normal limits  CBC WITH DIFFERENTIAL/PLATELET - Abnormal; Notable for the following components:   WBC 13.9 (*)    RBC 2.81 (*)    Hemoglobin 8.6 (*)    HCT 27.6 (*)    Neutro Abs 11.3 (*)    Abs Immature Granulocytes 0.10 (*)    All other components within normal limits  PROTIME-INR - Abnormal; Notable for the following components:   Prothrombin Time 15.5 (*)    All other components within normal limits  I-STAT CHEM 8, ED - Abnormal; Notable for the following components:   Potassium 6.5 (*)    BUN 120 (*)    Creatinine, Ser 15.70 (*)    Glucose, Bld 133 (*)    Calcium, Ion 1.03 (*)    TCO2 7 (*)    Hemoglobin 8.5 (*)    HCT 25.0 (*)    All other components within normal limits  I-STAT ARTERIAL BLOOD GAS, ED - Abnormal; Notable for the following components:   pH, Arterial 6.827 (*)    pO2, Arterial 312 (*)    Bicarbonate 6.2 (*)    TCO2 7 (*)    Acid-base deficit 26.0 (*)    Sodium 134 (*)    Potassium 6.5 (*)    Calcium, Ion 1.05 (*)    HCT 23.0 (*)    Hemoglobin 7.8 (*)    All other components within normal limits  RESP PANEL BY RT-PCR (FLU A&B, COVID) ARPGX2  CULTURE, BLOOD (ROUTINE X 2)  CULTURE, BLOOD (ROUTINE X 2)  CULTURE, RESPIRATORY W GRAM STAIN  ETHANOL  COMPREHENSIVE METABOLIC PANEL  LACTIC ACID, PLASMA  URINALYSIS, ROUTINE W REFLEX MICROSCOPIC  RAPID URINE DRUG SCREEN, HOSP PERFORMED  CK  ETHYLENE GLYCOL  VOLATILES,BLD-ACETONE,ETHANOL,ISOPROP,METHANOL  HIV ANTIBODY (ROUTINE TESTING W REFLEX)  BASIC METABOLIC PANEL  BASIC METABOLIC PANEL  MAGNESIUM  MAGNESIUM  PHOSPHORUS  PHOSPHORUS  PROCALCITONIN  STREP PNEUMONIAE URINARY ANTIGEN  BLOOD GAS, ARTERIAL  BLOOD GAS,  ARTERIAL  CBC  HEMOGLOBIN A1C  TROPONIN I (HIGH SENSITIVITY)  TROPONIN I (HIGH SENSITIVITY)    EKG None  Radiology CT Head Wo Contrast  Result Date: 01/16/2022 CLINICAL DATA:  Found unresponsive concern for trauma. EXAM: CT HEAD WITHOUT CONTRAST CT CERVICAL SPINE WITHOUT CONTRAST TECHNIQUE: Multidetector CT imaging of the head and cervical spine was performed following  the standard protocol without intravenous contrast. Multiplanar CT image reconstructions of the cervical spine were also generated. RADIATION DOSE REDUCTION: This exam was performed according to the departmental dose-optimization program which includes automated exposure control, adjustment of the mA and/or kV according to patient size and/or use of iterative reconstruction technique. COMPARISON:  None Available. Cervical spine of November of 2022, CT of the head from 2011. FINDINGS: CT HEAD FINDINGS Brain: No evidence of hemorrhage, hydrocephalus, extra-axial collection or mass lesion/mass effect. 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 Signs of atrophy which has occurred since previous imaging. Vascular: No hyperdense vessel or unexpected calcification. Skull: Normal. Negative for fracture or focal lesion. Sinuses/Orbits: Scattered ethmoid opacification. Sinuses are otherwise clear. No acute orbital process. Other: None. CT CERVICAL SPINE FINDINGS Alignment: Straightening of normal cervical lordotic curvature and head turned to the LEFT, related to patient position. Skull base and vertebrae: No acute fracture. No primary bone lesion or focal pathologic process. Soft tissues and spinal canal: No prevertebral fluid or swelling. No visible canal hematoma. Patient is intubated and the endotracheal tube is in the midtrachea. Disc levels: Mild degenerative changes throughout the cervical spine. Upper chest: Dependent airspace disease at the lung apices. Other: None IMPRESSION: 1. No acute intracranial  abnormality. 2. 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 3. No acute fracture or traumatic subluxation of the cervical spine. 4. Dependent airspace disease at the lung apices, may represent volume loss or aspiration related change. Electronically Signed   By: Zetta Bills M.D.   On: 01/16/2022 17:56   CT Cervical Spine Wo Contrast  Result Date: 01/16/2022 CLINICAL DATA:  Found unresponsive concern for trauma. EXAM: CT HEAD WITHOUT CONTRAST CT CERVICAL SPINE WITHOUT CONTRAST TECHNIQUE: Multidetector CT imaging of the head and cervical spine was performed following the standard protocol without intravenous contrast. Multiplanar CT image reconstructions of the cervical spine were also generated. RADIATION DOSE REDUCTION: This exam was performed according to the departmental dose-optimization program which includes automated exposure control, adjustment of the mA and/or kV according to patient size and/or use of iterative reconstruction technique. COMPARISON:  None Available. Cervical spine of November of 2022, CT of the head from 2011. FINDINGS: CT HEAD FINDINGS Brain: No evidence of hemorrhage, hydrocephalus, extra-axial collection or mass lesion/mass effect. 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 Signs of atrophy which has occurred since previous imaging. Vascular: No hyperdense vessel or unexpected calcification. Skull: Normal. Negative for fracture or focal lesion. Sinuses/Orbits: Scattered ethmoid opacification. Sinuses are otherwise clear. No acute orbital process. Other: None. CT CERVICAL SPINE FINDINGS Alignment: Straightening of normal cervical lordotic curvature and head turned to the LEFT, related to patient position. Skull base and vertebrae: No acute fracture. No primary bone lesion or focal pathologic process. Soft tissues and spinal canal: No prevertebral fluid or swelling. No visible canal hematoma. Patient is intubated and  the endotracheal tube is in the midtrachea. Disc levels: Mild degenerative changes throughout the cervical spine. Upper chest: Dependent airspace disease at the lung apices. Other: None IMPRESSION: 1. No acute intracranial abnormality. 2. 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 3. No acute fracture or traumatic subluxation of the cervical spine. 4. Dependent airspace disease at the lung apices, may represent volume loss or aspiration related change. Electronically Signed   By: Zetta Bills M.D.   On: 01/16/2022 17:56   DG Chest Portable 1 View  Result Date: 01/16/2022 CLINICAL DATA:  Intubation. EXAM:  PORTABLE CHEST 1 VIEW COMPARISON:  Chest x-ray 04/08/2020 FINDINGS: Endotracheal tube tip is 3.4 cm above the carina. The cardiomediastinal silhouette is within normal limits. Lung volumes are low likely accentuating central pulmonary vascularity. There is no focal lung infiltrate, pleural effusion or pneumothorax. No acute fractures are seen. IMPRESSION: 1. Endotracheal tube tip is 3.4 cm above the carina. 2. Low lung volumes. Electronically Signed   By: Ronney Asters M.D.   On: 01/16/2022 17:22    Procedures Procedure Name: Intubation Date/Time: 01/16/2022 5:04 PM  Performed by: Sherwood Gambler, MDPre-anesthesia Checklist: Patient identified, Patient being monitored, Emergency Drugs available, Timeout performed and Suction available Oxygen Delivery Method: Non-rebreather mask Preoxygenation: Pre-oxygenation with 100% oxygen Induction Type: Rapid sequence Ventilation: Mask ventilation without difficulty Laryngoscope Size: Glidescope and 3 Grade View: Grade II Tube size: 7.5 mm Number of attempts: 1 Airway Equipment and Method: Video-laryngoscopy Placement Confirmation: ETT inserted through vocal cords under direct vision, CO2 detector and Breath sounds checked- equal and bilateral Secured at: 22 cm Tube secured with: ETT holder Dental Injury: Teeth and  Oropharynx as per pre-operative assessment     .Critical Care  Performed by: Sherwood Gambler, MD Authorized by: Sherwood Gambler, MD   Critical care provider statement:    Critical care time (minutes):  50   Critical care time was exclusive of:  Separately billable procedures and treating other patients   Critical care was necessary to treat or prevent imminent or life-threatening deterioration of the following conditions:  Renal failure and respiratory failure   Critical care was time spent personally by me on the following activities:  Development of treatment plan with patient or surrogate, discussions with consultants, evaluation of patient's response to treatment, examination of patient, ordering and review of laboratory studies, ordering and review of radiographic studies, ordering and performing treatments and interventions, pulse oximetry, re-evaluation of patient's condition, review of old charts and ventilator management     Medications Ordered in ED Medications  fentaNYL (SUBLIMAZE) injection 50 mcg (has no administration in time range)  0.9 %  sodium chloride infusion (0 mLs Intravenous Hold 01/16/22 1909)  norepinephrine (LEVOPHED) 4mg  in 229mL (0.016 mg/mL) premix infusion (12 mcg/min Intravenous Infusion Verify 01/16/22 1907)  sodium bicarbonate 150 mEq in sterile water 1,150 mL infusion ( Intravenous New Bag/Given 01/16/22 1905)  fentaNYL (SUBLIMAZE) injection 50 mcg (has no administration in time range)  dorzolamide-timolol (COSOPT) 22.3-6.8 MG/ML ophthalmic solution 1 drop (has no administration in time range)  docusate sodium (COLACE) capsule 100 mg (has no administration in time range)  polyethylene glycol (MIRALAX / GLYCOLAX) packet 17 g (has no administration in time range)  heparin injection 5,000 Units (has no administration in time range)  pantoprazole (PROTONIX) injection 40 mg (has no administration in time range)  insulin aspart (novoLOG) injection 0-20 Units (has  no administration in time range)  sodium zirconium cyclosilicate (LOKELMA) packet 10 g (has no administration in time range)  0.9 %  sodium chloride infusion (has no administration in time range)  sodium chloride 0.9 % bolus 1,000 mL (0 mLs Intravenous Stopped 01/16/22 1750)  sodium chloride 0.9 % bolus 1,000 mL (0 mLs Intravenous Stopped 01/16/22 1810)  LORazepam (ATIVAN) injection 2 mg (2 mg Intravenous Given 01/16/22 1653)  calcium gluconate 1 g/ 50 mL sodium chloride IVPB (0 mg Intravenous Stopped 01/16/22 1852)  etomidate (AMIDATE) injection (30 mg Intravenous Given 01/16/22 1658)  rocuronium (ZEMURON) injection (100 mg Intravenous Given 01/16/22 1659)  sodium bicarbonate injection 50 mEq (50 mEq Intravenous Given  01/16/22 1840)  insulin aspart (novoLOG) injection 5 Units (5 Units Intravenous Given 01/16/22 1859)    And  dextrose 50 % solution 50 mL (50 mLs Intravenous Given 01/16/22 1901)  sodium bicarbonate injection 150 mEq (150 mEq Intravenous Given 01/16/22 1840)  lactated ringers bolus 1,000 mL (1,000 mLs Intravenous New Bag/Given 01/16/22 1906)    ED Course/ Medical Decision Making/ A&P                           Medical Decision Making Amount and/or Complexity of Data Reviewed Labs: ordered. Radiology: ordered.  Risk Prescription drug management. Decision regarding hospitalization.   Patient was intubated for airway protection, especially with concern for acute CNS emergency.  He almost was appearing postictal.  After intubation, he was hypotensive and started on fluids and small amount of Levophed.  Initial i-STAT Chem-8 shows severe renal failure and some mild hyperkalemia.  Given calcium.  He was started on aggressive IV fluids and then was given bicarbonate drip.  I discussed with Dr. Tacy Learn, he was given for ampules of bicarbonate.  Also discussed with Dr. Moshe Cipro who will consult and recommends typical hyperkalemia treatment like insulin and glucose and bicarbonate.  I  discussed patient's severe illness with family.  Otherwise, head CT images viewed by myself and there is no head bleed, and given he was found on the ground, CT cervical spine was obtained.  Chest x-ray without obvious pneumothorax or pneumonia.  Patient will be admitted to the critical care team and critical condition.        Final Clinical Impression(s) / ED Diagnoses Final diagnoses:  Acute encephalopathy  Acute renal failure, unspecified acute renal failure type ([place])  Metabolic acidosis    Rx / DC Orders ED Discharge Orders     None         Sherwood Gambler, MD 01/16/22 1945

## 2022-01-16 NOTE — Consult Note (Signed)
[facility] Renal Consultation Note  Requesting MD: Chand Indication for Consultation: AKI, metabolic acidosis and hyperkalemia  HPI:  Keith Garrett is a 56 y.o. male with past medical history significant for HTN, DM-  no known history of significant renal disease-  last crt though in system was from October of 2021 and was 1.0.  He was found down today and brought to ER-  reportedly felt badly for a couple of days prior to being discovered.  He was initially not felt to be able to protect his airway so was intubated.  Labs show a BUN and crt of 120 and 15-  bicarb of 7 and potassium of 6.5 -  pH of 6.8 and lactate of 9.  A foley was placed and had no UOP-  have been giving IVF-  BP as low in the 80's-  started on pressors as well.  I am asked to see for AKI-  CCM on board-  have been giving him volume and bicarb based fluids.  Son and friend are at bedside-  they say he is compliant with his meds and does not drink, smoke or do any drugs   Creat  Date/Time Value Ref Range Status  08/16/2020 10:42 AM 1.18 0.70 - 1.33 mg/dL Final    Comment:    For patients >63 years of age, the reference limit for Creatinine is approximately 13% higher for people identified as African-American. .    Creatinine, Ser  Date/Time Value Ref Range Status  01/16/2022 05:10 PM 15.70 (H) 0.61 - 1.24 mg/dL Final  04/19/2020 05:00 AM 1.00 0.61 - 1.24 mg/dL Final  04/18/2020 01:18 AM 1.19 0.61 - 1.24 mg/dL Final  04/17/2020 12:25 AM 1.07 0.61 - 1.24 mg/dL Final  04/16/2020 09:20 AM 1.06 0.61 - 1.24 mg/dL Final  04/14/2020 06:24 AM 0.95 0.61 - 1.24 mg/dL Final  04/13/2020 12:36 PM 1.04 0.61 - 1.24 mg/dL Final  02/13/2014 11:57 AM 0.50 0.50 - 1.35 mg/dL Final  09/21/2009 12:20 AM 0.80 0.4 - 1.5 mg/dL Final     PMHx:   Past Medical History:  Diagnosis Date   Cellulitis of foot, left 04/13/2020   Diabetes mellitus without complication ([place])    Hypertension    Osteomyelitis of ankle or foot,  acute, left ([place]) 04/19/2020   Traumatic rupture of plantar fascia of left foot 04/14/2020    Past Surgical History:  Procedure Laterality Date   ABDOMINAL AORTOGRAM W/LOWER EXTREMITY N/A 04/17/2020   Procedure: ABDOMINAL AORTOGRAM W/LOWER EXTREMITY;  Surgeon: Marty Heck, MD;  Location: [facility];  Service: Cardiovascular;  Laterality: N/A;   TEE WITHOUT CARDIOVERSION N/A 04/16/2020   Procedure: TRANSESOPHAGEAL ECHOCARDIOGRAM (TEE);  Surgeon: [name], MD;  Location: [facility] ENDOSCOPY;  Service: Cardiovascular;  Laterality: N/A;    Family Hx: No family history on file.  Social History:  reports that he has never smoked. He has never used smokeless tobacco. He reports current alcohol use. He reports that he does not use drugs.  Allergies: No Known Allergies  Medications: Prior to Admission medications   Medication Sig Start Date End Date Taking? Authorizing Provider  amLODipine (NORVASC) 10 MG tablet Take 10 mg by mouth daily. 02/29/20   [provider]  atorvastatin (LIPITOR) 40 MG tablet Take 1 tablet (40 mg total) by mouth daily. 04/20/20   Benay Pike, MD  cephALEXin (KEFLEX) 500 MG capsule Take 1 capsule (500 mg total) by mouth 4 (four) times daily. 08/16/20   Mignon Pine,  DO  ciprofloxacin (CIPRO) 500 MG tablet Take 1 tablet (500 mg total) by mouth 2 (two) times daily. 09/26/20   Newt Minion, MD  dorzolamide-timolol (COSOPT) 22.3-6.8 MG/ML ophthalmic solution Place 1 drop into the left eye 2 (two) times daily.  12/27/19   [provider]  glipiZIDE (GLUCOTROL) 10 MG tablet Take 10 mg by mouth daily. 02/29/20   [provider]  lidocaine (LIDODERM) 5 % Place 1 patch onto the skin daily. Remove & Discard patch within 12 hours or as directed by MD 05/06/21   Redwine, Madison A, PA-C  lisinopril (ZESTRIL) 40 MG tablet Take 40 mg by mouth daily. 02/29/20   [provider]  metFORMIN (GLUCOPHAGE) 1000 MG tablet Take 1,000  mg by mouth 2 (two) times daily. 02/29/20   [provider]  methocarbamol (ROBAXIN) 500 MG tablet Take 1 tablet (500 mg total) by mouth 2 (two) times daily. 05/06/21   Redwine, Madison A, PA-C    I have reviewed the patient's current medications.  Labs:  Results for orders placed or performed during the hospital encounter of 01/16/22 (from the past 48 hour(s))  Resp Panel by RT-PCR (Flu A&B, Covid) Anterior Nasal Swab     Status: None   Collection Time: 01/16/22  4:58 PM   Specimen: Anterior Nasal Swab  Result Value Ref Range   SARS Coronavirus 2 by RT PCR NEGATIVE NEGATIVE    Comment: (NOTE) SARS-CoV-2 target nucleic acids are NOT DETECTED.  The SARS-CoV-2 RNA is generally detectable in upper respiratory specimens during the acute phase of infection. The lowest concentration of SARS-CoV-2 viral copies this assay can detect is 138 copies/mL. A negative result does not preclude SARS-Cov-2 infection and should not be used as the sole basis for treatment or other patient management decisions. A negative result may occur with  improper specimen collection/handling, submission of specimen other than nasopharyngeal swab, presence of viral mutation(s) within the areas targeted by this assay, and inadequate number of viral copies(<138 copies/mL). A negative result must be combined with clinical observations, patient history, and epidemiological information. The expected result is Negative.  Fact Sheet for Patients:  EntrepreneurPulse.com.au  Fact Sheet for Healthcare Providers:  IncredibleEmployment.be  This test is no t yet approved or cleared by the [place] FDA and  has been authorized for detection and/or diagnosis of SARS-CoV-2 by FDA under an Emergency Use Authorization (EUA). This EUA will remain  in effect (meaning this test can be used) for the duration of the COVID-19 declaration under Section 564(b)(1) of the Act,  21 U.S.C.section 360bbb-3(b)(1), unless the authorization is terminated  or revoked sooner.       Influenza A by PCR NEGATIVE NEGATIVE   Influenza B by PCR NEGATIVE NEGATIVE    Comment: (NOTE) The Xpert Xpress SARS-CoV-2/FLU/RSV plus assay is intended as an aid in the diagnosis of influenza from Nasopharyngeal swab specimens and should not be used as a sole basis for treatment. Nasal washings and aspirates are unacceptable for Xpert Xpress SARS-CoV-2/FLU/RSV testing.  Fact Sheet for Patients: EntrepreneurPulse.com.au  Fact Sheet for Healthcare Providers: IncredibleEmployment.be  This test is not yet approved or cleared by the [place] FDA and has been authorized for detection and/or diagnosis of SARS-CoV-2 by FDA under an Emergency Use Authorization (EUA). This EUA will remain in effect (meaning this test can be used) for the duration of the COVID-19 declaration under Section 564(b)(1) of the Act, 21 U.S.C. section 360bbb-3(b)(1), unless the authorization is terminated or revoked.  Performed  at [facility], [place] [address], [place], [place] [address]   I-stat chem 8, ED     Status: Abnormal   Collection Time: 01/16/22  5:10 PM  Result Value Ref Range   Sodium 135 135 - 145 mmol/L   Potassium 6.5 (HH) 3.5 - 5.1 mmol/L   Chloride 109 98 - 111 mmol/L   BUN 120 (H) 6 - 20 mg/dL   Creatinine, Ser 15.70 (H) 0.61 - 1.24 mg/dL   Glucose, Bld 133 (H) 70 - 99 mg/dL    Comment: Glucose reference range applies only to samples taken after fasting for at least 8 hours.   Calcium, Ion 1.03 (L) 1.15 - 1.40 mmol/L   TCO2 7 (L) 22 - 32 mmol/L   Hemoglobin 8.5 (L) 13.0 - 17.0 g/dL   HCT 25.0 (L) 39.0 - 52.0 %   Comment NOTIFIED PHYSICIAN   Acetaminophen level     Status: Abnormal   Collection Time: 01/16/22  5:13 PM  Result Value Ref Range   Acetaminophen (Tylenol), Serum <10 (L) 10 - 30 ug/mL    Comment: (NOTE) Therapeutic concentrations  vary significantly. A range of 10-30 ug/mL  may be an effective concentration for many patients. However, some  are best treated at concentrations outside of this range. Acetaminophen concentrations >150 ug/mL at 4 hours after ingestion  and >50 ug/mL at 12 hours after ingestion are often associated with  toxic reactions.  Performed at [facility], [place] [address], [place], [place] [address]   Ethanol     Status: None   Collection Time: 01/16/22  5:13 PM  Result Value Ref Range   Alcohol, Ethyl (B) <10 <10 mg/dL    Comment: (NOTE) Lowest detectable limit for serum alcohol is 10 mg/dL.  For medical purposes only. Performed at [facility], [place] [address], [place], [place] [address]   CBC with Differential     Status: Abnormal   Collection Time: 01/16/22  5:13 PM  Result Value Ref Range   WBC 13.9 (H) 4.0 - 10.5 K/uL   RBC 2.81 (L) 4.22 - 5.81 MIL/uL   Hemoglobin 8.6 (L) 13.0 - 17.0 g/dL   HCT 27.6 (L) 39.0 - 52.0 %   MCV 98.2 80.0 - 100.0 fL   MCH 30.6 26.0 - 34.0 pg   MCHC 31.2 30.0 - 36.0 g/dL   RDW 13.1 11.5 - 15.5 %   Platelets 265 150 - 400 K/uL   nRBC 0.0 0.0 - 0.2 %   Neutrophils Relative % 81 %   Neutro Abs 11.3 (H) 1.7 - 7.7 K/uL   Lymphocytes Relative 15 %   Lymphs Abs 2.0 0.7 - 4.0 K/uL   Monocytes Relative 3 %   Monocytes Absolute 0.4 0.1 - 1.0 K/uL   Eosinophils Relative 0 %   Eosinophils Absolute 0.0 0.0 - 0.5 K/uL   Basophils Relative 0 %   Basophils Absolute 0.0 0.0 - 0.1 K/uL   Immature Granulocytes 1 %   Abs Immature Granulocytes 0.10 (H) 0.00 - 0.07 K/uL    Comment: Performed at [facility] [address], [place], [place] [address]  Protime-INR     Status: Abnormal   Collection Time: 01/16/22  5:13 PM  Result Value Ref Range   Prothrombin Time 15.5 (H) 11.4 - 15.2 seconds   INR 1.2 0.8 - 1.2    Comment: (NOTE) INR goal varies based on device and disease states. Performed at [facility],  [address]  [place], [place] [address]   Lactic acid, plasma     Status: Abnormal   Collection Time: 01/16/22  5:15 PM  Result Value Ref Range   Lactic Acid, Venous >9.0 (HH) 0.5 - 1.9 mmol/L    Comment: CRITICAL RESULT CALLED TO, READ BACK BY AND VERIFIED WITH M,COCHRANE RN @1857  01/16/22 E,BENTON Performed at [facility], [place] [address] [place], [place] [address]   I-Stat arterial blood gas, ED     Status: Abnormal   Collection Time: 01/16/22  5:42 PM  Result Value Ref Range   pH, Arterial 6.827 (LL) 7.35 - 7.45   pCO2 arterial 37.2 32 - 48 mmHg   pO2, Arterial 312 (H) 83 - 108 mmHg   Bicarbonate 6.2 (L) 20.0 - 28.0 mmol/L   TCO2 7 (L) 22 - 32 mmol/L   O2 Saturation 100 %   Acid-base deficit 26.0 (H) 0.0 - 2.0 mmol/L   Sodium 134 (L) 135 - 145 mmol/L   Potassium 6.5 (HH) 3.5 - 5.1 mmol/L   Calcium, Ion 1.05 (L) 1.15 - 1.40 mmol/L   HCT 23.0 (L) 39.0 - 52.0 %   Hemoglobin 7.8 (L) 13.0 - 17.0 g/dL   Collection site RADIAL, ALLEN'S TEST ACCEPTABLE    Drawn by RT    Sample type ARTERIAL    Comment NOTIFIED PHYSICIAN      ROS:  Review of systems not obtained due to patient factors.  Physical Exam: Vitals:   01/16/22 1830 01/16/22 1845  BP: (!) 92/43 (!) 103/48  Pulse: 90 100  Resp: (!) 32 (!) 32  Temp: (!) 93.6 F (34.2 C) (!) 93.5 F (34.2 C)  SpO2: 95% 96%     General: obtunded on vent-  got fentanyl HEENT: PERRLA, EOMI  Neck: no JVD Heart: tachy Lungs: mostly clear Abdomen: distended but no response to deep palpation Extremities: no edema Skin: warm and dry Neuro: unresponsive   Assessment/Plan: 56 year old hispanic male with DM, HTN now presenting with AKI and profound acidosis with hyperkalemia 1.Renal- last known crt was normal in 2021.  Difficult to know if this is acute, subacute  or chronic in the setting of longstanding DM and HTN. No urine available for review-  will check a renal ultrasound.  Given his hypotension and lactate of 9 suspect major issue is  hemodynamic at this point/ATN.  Attempting volume resuscitation, correction of acidosis to see if can correct hyperkalemia in the short term.  I am OK with a few hours of attempted medical management but if not improved at that point would need CRRT for support.  I have informed elink of such as well as the son.   Absolutely no UOP is concerning   2. Hypertension/volume  - hypotensive and appears dry, was taking ACE-I as a home med-  aggressive volume resuscitation and pressors 3.  Leukocytosis-  unclear if septic type picture-  started on unasyn 4. Metabolic acidosis- is due to AKI and lactate-  bicarb amps and bicarb based fluids 5. Hyperkalemia -  due to AKI and acidosis -  given short term treatments and also lokelma -  will follow closely  6. Anemia  - is pretty severe and with patient being dry as well.  Points to the fact that this could be CKD-  supportive care for now    Louis Meckel 01/16/2022, 7:06 PM

## 2022-01-16 NOTE — H&P (Addendum)
NAME:  Keith Garrett, MRN:  [id number], DOB:  Jul 12, 1965, LOS: 0 ADMISSION DATE:  01/16/2022, CONSULTATION DATE:  7/28 REFERRING MD:  Regenia Skeeter, CHIEF COMPLAINT:  respiratory failure and severe metabolic derangements    History of Present Illness:  56 year old male patient with comorbid status listed below.  Was found by family lying facedown on the floor on 7/28.  EMS was called.  Initially he was unresponsive, then shortly after EMS arrival was agitated.  No reported history of drug use.  Speaks Spanish only.  Apparently had been feeling poorly for about 2 days prior after eating [place] food for lunch.  On 7/27 had been vomiting.  Apparently had gone to work earlier the day of admission.  On arrival to the emergency room his GCS was 5, he was not able to protect his airway, and was subsequently intubated for airway protection. ER evaluation: Initial potassium 6.5 BUN 120 creatinine 15.7 baseline 1.18.  Bicarbonate 7, sodium 135 White blood cell count 13.9 hemoglobin 8.6 initial arterial blood gas 6.83, PCO2 37 PO2 312 HCO3 6.2.  He was administered 2 L of sodium chloride, IV insulin, D50, and bicarbonate infusion was ordered.  Pulmonary was asked to admit, nephrology asked to see in consultation. Pertinent  Medical History  Hypertension, type 2 diabetes,Cataracts, lower extremity edema, CKD stage II, obesity Significant Hospital Events: Including procedures, antibiotic start and stop dates in addition to other pertinent events   7/28 admitted encephalopathic with acute on chronic renal failure, profound metabolic acidosis, hypotension, and severe metabolic derangements.  Interim History / Subjective:  Minimally responsive  Objective   Blood pressure (Abnormal) 101/51, pulse (Abnormal) 101, resp. rate (Abnormal) 26, SpO2 97 %.    Vent Mode: PRVC FiO2 (%):  [100 %] 100 % Set Rate:  [18 bmp] 18 bmp Vt Set:  [510 mL] 510 mL PEEP:  [5 cmH20] 5 cmH20 Plateau Pressure:  [19 cmH20] 19 cmH20    Intake/Output Summary (Last 24 hours) at 01/16/2022 1805 Last data filed at 01/16/2022 1750 Gross per 24 hour  Intake 1000 ml  Output no documentation  Net 1000 ml   There were no vitals filed for this visit.  Examination: General: Obese 56 year old male patient currently on full ventilator support HENT: Normocephalic atraumatic orally intubated pupils pinpoint Lungs: Clear equal chest rise bilaterally currently on full ventilator support Cardiovascular: Regular rate and rhythm without murmur rub or gallop Abdomen: Soft not tender no organomegaly Extremities: Warm dry with palpable pulses Neuro: Minimally responsive no cough or gag GU: Foley catheter in place without urine output today  Resolved Hospital Problem list     Assessment & Plan:   Principal Problem:   Acute metabolic encephalopathy Active Problems:   Metabolic acidosis   Acute respiratory failure (HCC)   Acute renal failure superimposed on stage 2 chronic kidney disease (HCC)   Hypovolemia   Normocytic anemia   Bilateral cataracts   Hyperkalemia   Acute metabolic encephalopathy secondary to uremia and profound metabolic acidosis Can't exclude toxic injection Plan Supportive care Correcting acid-base Minimize sedation  Acute respiratory failure with inability to protect airway c/b prob aspiration Plan Full ventilator support Minute ventilation adjusted to provide respiratory compensation  VAP bundle Unasyn  Acute on chronic renal failure (CKD stage II baseline), with profound metabolic acidosis and hyperkalemia -Suspect mixed picture of hypovolemia from nausea and vomiting superimposed on underlying ace-Is Plan Cont IVFs (bicarb pushes f/b infusion for MIVFs) Lokelma X1 Serial chems and ABGs Strict I&O (foley placed) Check ethylene  glycol, acetone, ethanol, isoprop, and methanol  Ck lactic acid  Nephro consulted  Circulatory shock. Favor volume depletion; r/o sepsis  Plan Repeat fluid  bolus Norepi for MAP > 65 Hold antihypertensives  H/o DM  Plan Ssi   Anemia w/out evidence of active bleeding Plan Trend cbc SCD for now    Best Practice (right click and "Reselect all SmartList Selections" daily)   Diet/type: NPO DVT prophylaxis: prophylactic heparin  GI prophylaxis: PPI Lines: N/A Foley:  Yes, and it is still needed Code Status:  full code Last date of multidisciplinary goals of care discussion [pending]  Labs   CBC: Recent Labs  Lab 01/16/22 1710 01/16/22 1713 01/16/22 1742  WBC  --  13.9*  --   NEUTROABS  --  11.3*  --   HGB 8.5* 8.6* 7.8*  HCT 25.0* 27.6* 23.0*  MCV  --  98.2  --   PLT  --  265  --     Basic Metabolic Panel: Recent Labs  Lab 01/16/22 1710 01/16/22 1742  NA 135 134*  K 6.5* 6.5*  CL 109  --   GLUCOSE 133*  --   BUN 120*  --   CREATININE 15.70*  --    GFR: CrCl cannot be calculated (Unknown ideal weight.). Recent Labs  Lab 01/16/22 1713  WBC 13.9*    Liver Function Tests: No results for input(s): "AST", "ALT", "ALKPHOS", "BILITOT", "PROT", "ALBUMIN" in the last 168 hours. No results for input(s): "LIPASE", "AMYLASE" in the last 168 hours. No results for input(s): "AMMONIA" in the last 168 hours.  ABG    Component Value Date/Time   PHART 6.827 (LL) 01/16/2022 1742   PCO2ART 37.2 01/16/2022 1742   PO2ART 312 (H) 01/16/2022 1742   HCO3 6.2 (L) 01/16/2022 1742   TCO2 7 (L) 01/16/2022 1742   ACIDBASEDEF 26.0 (H) 01/16/2022 1742   O2SAT 100 01/16/2022 1742     Coagulation Profile: Recent Labs  Lab 01/16/22 1713  INR 1.2    Cardiac Enzymes: No results for input(s): "CKTOTAL", "CKMB", "CKMBINDEX", "TROPONINI" in the last 168 hours.  HbA1C: Hgb A1c MFr Bld  Date/Time Value Ref Range Status  04/15/2020 09:52 AM 6.7 (H) 4.8 - 5.6 % Final    Comment:    (NOTE)         Prediabetes: 5.7 - 6.4         Diabetes: >6.4         Glycemic control for adults with diabetes: <7.0   04/14/2020 06:24 AM 6.9 (H)  4.8 - 5.6 % Final    Comment:    (NOTE)         Prediabetes: 5.7 - 6.4         Diabetes: >6.4         Glycemic control for adults with diabetes: <7.0     CBG: No results for input(s): "GLUCAP" in the last 168 hours.  Review of Systems:   Not able   Past Medical History:  He,  has a past medical history of Cellulitis of foot, left (04/13/2020), Diabetes mellitus without complication ([place]), Hypertension, Osteomyelitis of ankle or foot, acute, left ([place]) (04/19/2020), and Traumatic rupture of plantar fascia of left foot (04/14/2020).   Surgical History:   Past Surgical History:  Procedure Laterality Date   ABDOMINAL AORTOGRAM W/LOWER EXTREMITY N/A 04/17/2020   Procedure: ABDOMINAL AORTOGRAM W/LOWER EXTREMITY;  Surgeon: Marty Heck, MD;  Location: [facility];  Service: Cardiovascular;  Laterality: N/A;  TEE WITHOUT CARDIOVERSION N/A 04/16/2020   Procedure: TRANSESOPHAGEAL ECHOCARDIOGRAM (TEE);  Surgeon: [name], MD;  Location: [facility];  Service: Cardiovascular;  Laterality: N/A;     Social History:   reports that he has never smoked. He has never used smokeless tobacco. He reports current alcohol use. He reports that he does not use drugs.   Family History:  His family history is not on file.   Allergies No Known Allergies   Home Medications  Prior to Admission medications   Medication Sig Start Date End Date Taking? Authorizing Provider  amLODipine (NORVASC) 10 MG tablet Take 10 mg by mouth daily. 02/29/20   [provider]  atorvastatin (LIPITOR) 40 MG tablet Take 1 tablet (40 mg total) by mouth daily. 04/20/20   Benay Pike, MD  cephALEXin (KEFLEX) 500 MG capsule Take 1 capsule (500 mg total) by mouth 4 (four) times daily. 08/16/20   Mignon Pine, DO  ciprofloxacin (CIPRO) 500 MG tablet Take 1 tablet (500 mg total) by mouth 2 (two) times daily. 09/26/20   Newt Minion, MD  dorzolamide-timolol (COSOPT) 22.3-6.8 MG/ML  ophthalmic solution Place 1 drop into the left eye 2 (two) times daily.  12/27/19   [provider]  glipiZIDE (GLUCOTROL) 10 MG tablet Take 10 mg by mouth daily. 02/29/20   [provider]  lidocaine (LIDODERM) 5 % Place 1 patch onto the skin daily. Remove & Discard patch within 12 hours or as directed by MD 05/06/21   Redwine, Madison A, PA-C  lisinopril (ZESTRIL) 40 MG tablet Take 40 mg by mouth daily. 02/29/20   [provider]  metFORMIN (GLUCOPHAGE) 1000 MG tablet Take 1,000 mg by mouth 2 (two) times daily. 02/29/20   [provider]  methocarbamol (ROBAXIN) 500 MG tablet Take 1 tablet (500 mg total) by mouth 2 (two) times daily. 05/06/21   Redwine, Cecilio Asper, PA-C     Critical care time: 35 min     Erick Colace ACNP-BC [place] Pager # [phone] OR # [phone] if no answer

## 2022-01-16 NOTE — Procedures (Signed)
Central Venous Catheter Insertion Procedure Note  Keith Garrett  [id number]  07-08-1965  Date:01/16/22  Time:8:49 PM   Provider Performing:Cherolyn Behrle   Procedure: Insertion of Non-tunneled Central Venous Catheter(36556)with US guidance (51700)    Indication(s) Hemodialysis  Consent Risks of the procedure as well as the alternatives and risks of each were explained to the patient and/or caregiver.  Consent for the procedure was obtained and is signed in the bedside chart  Anesthesia Topical only with 1% lidocaine   Timeout Verified patient identification, verified procedure, site/side was marked, verified correct patient position, special equipment/implants available, medications/allergies/relevant history reviewed, required imaging and test results available.  Sterile Technique Maximal sterile technique including full sterile barrier drape, hand hygiene, sterile gown, sterile gloves, mask, hair covering, sterile ultrasound probe cover (if used).  Procedure Description Area of catheter insertion was cleaned with chlorhexidine and draped in sterile fashion.   With real-time ultrasound guidance a 15cm 81F Trialysis HD catheter was placed into the right internal jugular vein.  Nonpulsatile blood flow and easy flushing noted in all ports.  The catheter was sutured in place and sterile dressing applied.     Complications/Tolerance None; patient tolerated the procedure well. Chest X-ray is ordered to verify placement for internal jugular or subclavian cannulation.  Chest x-ray is not ordered for femoral cannulation.  EBL Minimal  Specimen(s) None  Kipp Brood, MD [facility] ICU Physician [place]  Pager: [phone] Or Epic Secure Chat After hours: [phone].  01/16/2022, 8:52 PM

## 2022-01-16 NOTE — ED Triage Notes (Signed)
Pt arrives via [facility] from home as unresponsive. Pt was found laying face down in his bedroom. Per family pt went to work and came home,  then they found him like this - unknown times. Language barrier w/ pt and family - speak spanish. Pt VSS w/ EMS but unresponsive to pain. GCS 5, cbg 113

## 2022-01-16 NOTE — Progress Notes (Signed)
Pharmacy Antibiotic Note  Keith Garrett is a 56 y.o. male admitted on 01/16/2022 with  aspiration pneumonia .  Pharmacy has been consulted for unasyn dosing.  Was found unresponsive at home now intubated. Scr 12.6 - plan for CRRT, LA >9, WBC 13.9. CT spine showing dependent airspace disease at lung apices concerning for aspiration.   Plan: Start unasyn 3g IV every 8 hours while on CRRT Monitor CRRT tolerance, cx results, and clinical pic     Temp (24hrs), Avg:93.6 F (34.2 C), Min:93.2 F (34 C), Max:93.8 F (34.3 C)  Recent Labs  Lab 01/16/22 1710 01/16/22 1713 01/16/22 1715  WBC  --  13.9*  --   CREATININE 15.70* 12.61*  --   LATICACIDVEN  --   --  >9.0*    CrCl cannot be calculated (Unknown ideal weight.).    No Known Allergies  Antimicrobials this admission: Unasyn 7/28 >>   Dose adjustments this admission: N/A  Microbiology results: 7/28 BCx: ordered 7/28 COVID/Flu PCR: neg  7/28 Sputum: ordered  7/28 MRSA PCR: ordered  Thank you for allowing pharmacy to be a part of this patient's care.  Antonietta Jewel, PharmD, [place] Clinical Pharmacist  Phone: [phone] 01/16/2022 8:33 PM  Please check AMION for all [place] phone numbers After 10:00 PM, call [place] [phone]

## 2022-01-16 NOTE — ED Notes (Signed)
Bair hugger applied.

## 2022-01-16 NOTE — Progress Notes (Signed)
Patient transported to CT and back without complications. RN at bedside.  

## 2022-01-17 ENCOUNTER — Inpatient Hospital Stay (HOSPITAL_COMMUNITY): Payer: Self-pay

## 2022-01-17 DIAGNOSIS — I428 Other cardiomyopathies: Secondary | ICD-10-CM

## 2022-01-17 LAB — POCT I-STAT 7, (LYTES, BLD GAS, ICA,H+H)
Acid-base deficit: 17 mmol/L — ABNORMAL HIGH (ref 0.0–2.0)
Acid-base deficit: 9 mmol/L — ABNORMAL HIGH (ref 0.0–2.0)
Acid-base deficit: 4 mmol/L — ABNORMAL HIGH (ref 0.0–2.0)
Bicarbonate: 9.8 mmol/L — ABNORMAL LOW (ref 20.0–28.0)
Bicarbonate: 15 mmol/L — ABNORMAL LOW (ref 20.0–28.0)
Bicarbonate: 21.2 mmol/L (ref 20.0–28.0)
Calcium, Ion: 0.93 mmol/L — ABNORMAL LOW (ref 1.15–1.40)
Calcium, Ion: 0.91 mmol/L — ABNORMAL LOW (ref 1.15–1.40)
Calcium, Ion: 0.92 mmol/L — ABNORMAL LOW (ref 1.15–1.40)
HCT: 23 % — ABNORMAL LOW (ref 39.0–52.0)
HCT: 22 % — ABNORMAL LOW (ref 39.0–52.0)
HCT: 21 % — ABNORMAL LOW (ref 39.0–52.0)
Hemoglobin: 7.8 g/dL — ABNORMAL LOW (ref 13.0–17.0)
Hemoglobin: 7.5 g/dL — ABNORMAL LOW (ref 13.0–17.0)
Hemoglobin: 7.1 g/dL — ABNORMAL LOW (ref 13.0–17.0)
O2 Saturation: 96 %
O2 Saturation: 89 %
O2 Saturation: 95 %
Patient temperature: 36.5
Patient temperature: 36.7
Patient temperature: 35.9
Potassium: 5.3 mmol/L — ABNORMAL HIGH (ref 3.5–5.1)
Potassium: 4.8 mmol/L (ref 3.5–5.1)
Potassium: 4.6 mmol/L (ref 3.5–5.1)
Sodium: 134 mmol/L — ABNORMAL LOW (ref 135–145)
Sodium: 136 mmol/L (ref 135–145)
Sodium: 137 mmol/L (ref 135–145)
TCO2: 11 mmol/L — ABNORMAL LOW (ref 22–32)
TCO2: 16 mmol/L — ABNORMAL LOW (ref 22–32)
TCO2: 22 mmol/L (ref 22–32)
pCO2 arterial: 26.6 mmHg — ABNORMAL LOW (ref 32–48)
pCO2 arterial: 23.2 mmHg — ABNORMAL LOW (ref 32–48)
pCO2 arterial: 35.2 mmHg (ref 32–48)
pH, Arterial: 7.17 — CL (ref 7.35–7.45)
pH, Arterial: 7.417 (ref 7.35–7.45)
pH, Arterial: 7.384 (ref 7.35–7.45)
pO2, Arterial: 102 mmHg (ref 83–108)
pO2, Arterial: 52 mmHg — ABNORMAL LOW (ref 83–108)
pO2, Arterial: 71 mmHg — ABNORMAL LOW (ref 83–108)

## 2022-01-17 LAB — BASIC METABOLIC PANEL
BUN: 89 mg/dL — ABNORMAL HIGH (ref 6–20)
CO2: <7 mmol/L — ABNORMAL LOW (ref 22–32)
Calcium: 6.9 mg/dL — ABNORMAL LOW (ref 8.9–10.3)
Chloride: 101 mmol/L (ref 98–111)
Creatinine, Ser: 10.77 mg/dL — ABNORMAL HIGH (ref 0.61–1.24)
GFR, Estimated: 5 mL/min — ABNORMAL LOW (ref >60)
Glucose, Bld: 258 mg/dL — ABNORMAL HIGH (ref 70–99)
Potassium: 5.7 mmol/L — ABNORMAL HIGH (ref 3.5–5.1)
Sodium: 139 mmol/L (ref 135–145)

## 2022-01-17 LAB — CBC
HCT: 24.9 % — ABNORMAL LOW (ref 39.0–52.0)
Hemoglobin: 7.9 g/dL — ABNORMAL LOW (ref 13.0–17.0)
MCH: 31 pg (ref 26.0–34.0)
MCHC: 31.7 g/dL (ref 30.0–36.0)
MCV: 97.6 fL (ref 80.0–100.0)
Platelets: 197 10*3/uL (ref 150–400)
RBC: 2.55 MIL/uL — ABNORMAL LOW (ref 4.22–5.81)
RDW: 13.2 % (ref 11.5–15.5)
WBC: 25.7 10*3/uL — ABNORMAL HIGH (ref 4.0–10.5)
nRBC: 0 % (ref 0.0–0.2)

## 2022-01-17 LAB — GLUCOSE, CAPILLARY
Glucose-Capillary: 110 mg/dL — ABNORMAL HIGH (ref 70–99)
Glucose-Capillary: 114 mg/dL — ABNORMAL HIGH (ref 70–99)
Glucose-Capillary: 158 mg/dL — ABNORMAL HIGH (ref 70–99)
Glucose-Capillary: 189 mg/dL — ABNORMAL HIGH (ref 70–99)
Glucose-Capillary: 226 mg/dL — ABNORMAL HIGH (ref 70–99)
Glucose-Capillary: 232 mg/dL — ABNORMAL HIGH (ref 70–99)
Glucose-Capillary: 85 mg/dL (ref 70–99)

## 2022-01-17 LAB — RENAL FUNCTION PANEL
Albumin: 2.1 g/dL — ABNORMAL LOW (ref 3.5–5.0)
Anion gap: 12 (ref 5–15)
BUN: 48 mg/dL — ABNORMAL HIGH (ref 6–20)
CO2: 16 mmol/L — ABNORMAL LOW (ref 22–32)
Calcium: 5.1 mg/dL — CL (ref 8.9–10.3)
Chloride: 112 mmol/L — ABNORMAL HIGH (ref 98–111)
Creatinine, Ser: 5.33 mg/dL — ABNORMAL HIGH (ref 0.61–1.24)
GFR, Estimated: 12 mL/min — ABNORMAL LOW (ref >60)
Glucose, Bld: 65 mg/dL — ABNORMAL LOW (ref 70–99)
Phosphorus: 3.9 mg/dL (ref 2.5–4.6)
Potassium: 3.4 mmol/L — ABNORMAL LOW (ref 3.5–5.1)
Sodium: 140 mmol/L (ref 135–145)

## 2022-01-17 LAB — ECHOCARDIOGRAM COMPLETE
AR max vel: 1.98 cm2
AV Area VTI: 2.1 cm2
AV Area mean vel: 2 cm2
AV Mean grad: 13.6 mmHg
AV Peak grad: 24.6 mmHg
Ao pk vel: 2.48 m/s
Area-P 1/2: 4.37 cm2
Calc EF: 65.9 %
Height: 62 in
S' Lateral: 2.8 cm
Single Plane A2C EF: 64.4 %
Single Plane A4C EF: 65.2 %
Weight: 3414.48 oz

## 2022-01-17 LAB — POCT ACTIVATED CLOTTING TIME
Activated Clotting Time: 143 seconds
Activated Clotting Time: 155 seconds
Activated Clotting Time: 155 seconds
Activated Clotting Time: 155 seconds
Activated Clotting Time: 155 seconds
Activated Clotting Time: 161 seconds
Activated Clotting Time: 155 seconds
Activated Clotting Time: 167 seconds
Activated Clotting Time: 167 seconds
Activated Clotting Time: 173 seconds
Activated Clotting Time: 173 seconds
Activated Clotting Time: 179 seconds
Activated Clotting Time: 179 seconds
Activated Clotting Time: 191 seconds
Activated Clotting Time: 155 seconds

## 2022-01-17 LAB — PHOSPHORUS: Phosphorus: 9 mg/dL — ABNORMAL HIGH (ref 2.5–4.6)

## 2022-01-17 LAB — APTT: aPTT: 53 seconds — ABNORMAL HIGH (ref 24–36)

## 2022-01-17 LAB — VOLATILES,BLD-ACETONE,ETHANOL,ISOPROP,METHANOL
Acetone, blood: <0.01 g/dL (ref 0.000–0.010)
Ethanol, blood: <0.01 g/dL (ref 0.000–0.010)
Isopropanol, blood: <0.01 g/dL (ref 0.000–0.010)
Methanol, blood: <0.01 g/dL (ref 0.000–0.010)

## 2022-01-17 LAB — MAGNESIUM
Magnesium: 2 mg/dL (ref 1.7–2.4)
Magnesium: 1.3 mg/dL — ABNORMAL LOW (ref 1.7–2.4)

## 2022-01-17 LAB — ETHYLENE GLYCOL: Ethylene Glycol Lvl: <5 mg/dL

## 2022-01-17 MED ORDER — PERFLUTREN LIPID MICROSPHERE
1.0000 mL | INTRAVENOUS | Status: AC | PRN
  Administered 2022-01-17: 2 mL via INTRAVENOUS

## 2022-01-17 MED ORDER — RENA-VITE PO TABS
1.0000 | ORAL_TABLET | Freq: Every day | ORAL | Status: DC
Start: 2022-01-17 — End: 2022-01-21
  Administered 2022-01-17 – 2022-01-20 (×4): 1
  Filled 2022-01-17 (×4): qty 1

## 2022-01-17 MED ORDER — ALBUMIN HUMAN 5 % IV SOLN
INTRAVENOUS | Status: AC
  Filled 2022-01-17: qty 250

## 2022-01-17 MED ORDER — PROSOURCE TF PO LIQD
45.0000 mL | Freq: Every day | ORAL | Status: DC
  Administered 2022-01-18 – 2022-01-20 (×3): 45 mL
  Filled 2022-01-17 (×3): qty 45

## 2022-01-17 MED ORDER — MIDAZOLAM HCL 2 MG/2ML IJ SOLN
INTRAMUSCULAR | Status: AC
  Administered 2022-01-17: 2 mg
  Filled 2022-01-17: qty 2

## 2022-01-17 MED ORDER — MIDAZOLAM HCL 2 MG/2ML IJ SOLN
2.0000 mg | INTRAMUSCULAR | Status: DC | PRN
  Administered 2022-01-17: 2 mg via INTRAVENOUS
  Filled 2022-01-17 (×2): qty 2

## 2022-01-17 MED ORDER — ALTEPLASE 2 MG IJ SOLR: 4.0000 mg | Freq: Once | INTRAMUSCULAR | Status: AC

## 2022-01-17 MED ORDER — SODIUM CHLORIDE 0.9 % IV SOLN
2.0000 g | Freq: Two times a day (BID) | INTRAVENOUS | Status: DC
  Administered 2022-01-17: 2 g via INTRAVENOUS
  Filled 2022-01-17: qty 12.5

## 2022-01-17 MED ORDER — INSULIN DETEMIR 100 UNIT/ML ~~LOC~~ SOLN
15.0000 [IU] | Freq: Two times a day (BID) | SUBCUTANEOUS | Status: DC
  Administered 2022-01-17 (×2): 15 [IU] via SUBCUTANEOUS
  Filled 2022-01-17 (×4): qty 0.15

## 2022-01-17 MED ORDER — DOCUSATE SODIUM 50 MG/5ML PO LIQD
100.0000 mg | Freq: Two times a day (BID) | ORAL | Status: DC
Start: 2022-01-17 — End: 2022-01-21
  Administered 2022-01-17 – 2022-01-20 (×8): 100 mg
  Filled 2022-01-17 (×8): qty 10

## 2022-01-17 MED ORDER — SODIUM CHLORIDE 0.9 % IV SOLN
250.0000 [IU]/h | INTRAVENOUS | Status: DC
  Administered 2022-01-17: 250 [IU]/h via INTRAVENOUS_CENTRAL
  Administered 2022-01-17: 1150 [IU]/h via INTRAVENOUS_CENTRAL
  Administered 2022-01-17: 1550 [IU]/h via INTRAVENOUS_CENTRAL
  Administered 2022-01-17: 750 [IU]/h via INTRAVENOUS_CENTRAL
  Administered 2022-01-17: 650 [IU]/h via INTRAVENOUS_CENTRAL
  Administered 2022-01-17: 1350 [IU]/h via INTRAVENOUS_CENTRAL
  Administered 2022-01-17: 1150 [IU]/h via INTRAVENOUS_CENTRAL
  Administered 2022-01-18: 1200 [IU]/h via INTRAVENOUS_CENTRAL
  Administered 2022-01-19: 1050 [IU]/h via INTRAVENOUS_CENTRAL
  Administered 2022-01-19: 800 [IU]/h via INTRAVENOUS_CENTRAL
  Administered 2022-01-19: 1050 [IU]/h via INTRAVENOUS_CENTRAL
  Administered 2022-01-19: 800 [IU]/h via INTRAVENOUS_CENTRAL
  Filled 2022-01-17 (×9): qty 2

## 2022-01-17 MED ORDER — ALTEPLASE 2 MG IJ SOLR
2.0000 mg | Freq: Once | INTRAMUSCULAR | Status: AC | PRN
Start: 2022-01-17 — End: 2022-01-17
  Administered 2022-01-17: 2 mg via INTRAVENOUS_CENTRAL
  Filled 2022-01-17: qty 2

## 2022-01-17 MED ORDER — POLYETHYLENE GLYCOL 3350 17 G PO PACK
17.0000 g | PACK | Freq: Every day | ORAL | Status: DC
  Administered 2022-01-17 – 2022-01-20 (×4): 17 g
  Filled 2022-01-17 (×5): qty 1

## 2022-01-17 MED ORDER — PROSOURCE TF PO LIQD
45.0000 mL | Freq: Two times a day (BID) | ORAL | Status: DC
  Administered 2022-01-17: 45 mL
  Filled 2022-01-17: qty 45

## 2022-01-17 MED ORDER — MIDAZOLAM-SODIUM CHLORIDE 100-0.9 MG/100ML-% IV SOLN
0.5000 mg/h | INTRAVENOUS | Status: DC
  Administered 2022-01-17: 2 mg/h via INTRAVENOUS
  Administered 2022-01-18 – 2022-01-19 (×2): 10 mg/h via INTRAVENOUS
  Filled 2022-01-17 (×4): qty 100

## 2022-01-17 MED ORDER — SODIUM CHLORIDE 0.9 % IV SOLN: 5.0000 mg | Freq: Once | INTRAVENOUS | Status: DC

## 2022-01-17 MED ORDER — CALCIUM GLUCONATE-NACL 1-0.675 GM/50ML-% IV SOLN
1.0000 g | Freq: Once | INTRAVENOUS | Status: AC
Start: 2022-01-17 — End: 2022-01-17
  Administered 2022-01-17: 1000 mg via INTRAVENOUS
  Filled 2022-01-17: qty 50

## 2022-01-17 MED ORDER — VANCOMYCIN HCL IN DEXTROSE 1-5 GM/200ML-% IV SOLN: 1000.0000 mg | INTRAVENOUS | Status: DC

## 2022-01-17 MED ORDER — VITAL AF 1.2 CAL PO LIQD
1000.0000 mL | ORAL | Status: DC
  Administered 2022-01-17 – 2022-01-21 (×6): 1000 mL
  Filled 2022-01-17: qty 1000

## 2022-01-17 MED ORDER — VANCOMYCIN HCL 2000 MG/400ML IV SOLN
2000.0000 mg | Freq: Once | INTRAVENOUS | Status: AC
  Administered 2022-01-17: 2000 mg via INTRAVENOUS
  Filled 2022-01-17: qty 400

## 2022-01-17 MED ORDER — MIDAZOLAM HCL 2 MG/2ML IJ SOLN
2.0000 mg | INTRAMUSCULAR | Status: AC | PRN
  Administered 2022-01-17 (×3): 2 mg via INTRAVENOUS
  Filled 2022-01-17 (×3): qty 2

## 2022-01-17 MED ORDER — VITAL HIGH PROTEIN PO LIQD
1000.0000 mL | ORAL | Status: DC
Start: 2022-01-17 — End: 2022-01-17
  Administered 2022-01-17: 1000 mL

## 2022-01-17 MED ORDER — CHLORHEXIDINE GLUCONATE CLOTH 2 % EX PADS
6.0000 | MEDICATED_PAD | Freq: Every day | CUTANEOUS | Status: DC
  Administered 2022-01-17 – 2022-01-23 (×7): 6 via TOPICAL

## 2022-01-17 MED ORDER — HEPARIN BOLUS VIA INFUSION (CRRT)
1000.0000 [IU] | INTRAVENOUS | Status: DC | PRN
  Administered 2022-01-17: 1000 [IU] via INTRAVENOUS_CENTRAL

## 2022-01-17 NOTE — Progress Notes (Signed)
Call to [facility] regarding pt. sedation. Pt. dyssynchronous with ventilator and breath stacking. On 472mcg/hr fentanyl continuous gtt with 2mg  versed IV push q2.

## 2022-01-17 NOTE — Progress Notes (Signed)
  Echocardiogram 2D Echocardiogram has been performed.  Keith Garrett 01/17/2022, 10:37 AM

## 2022-01-17 NOTE — Progress Notes (Signed)
NAME:  Kolton Kienle, MRN:  [id number], DOB:  1966/02/10, LOS: 1 ADMISSION DATE:  01/16/2022, CONSULTATION DATE:  7/28 REFERRING MD:  Regenia Skeeter, CHIEF COMPLAINT:  respiratory failure and severe metabolic derangements    History of Present Illness:  56 year old male patient with comorbid status listed below.  Was found by family lying facedown on the floor on 7/28.  EMS was called.  Initially he was unresponsive, then shortly after EMS arrival was agitated.  No reported history of drug use.  Speaks Spanish only.  Apparently had been feeling poorly for about 2 days prior after eating [place] food for lunch.  On 7/27 had been vomiting.  Apparently had gone to work earlier the day of admission.  On arrival to the emergency room his GCS was 5, he was not able to protect his airway, and was subsequently intubated for airway protection. ER evaluation: Initial potassium 6.5 BUN 120 creatinine 15.7 baseline 1.18.  Bicarbonate 7, sodium 135 White blood cell count 13.9 hemoglobin 8.6 initial arterial blood gas 6.83, PCO2 37 PO2 312 HCO3 6.2.  He was administered 2 L of sodium chloride, IV insulin, D50, and bicarbonate infusion was ordered.  Pulmonary was asked to admit, nephrology asked to see in consultation. Pertinent  Medical History  Hypertension, type 2 diabetes,Cataracts, lower extremity edema, CKD stage II, obesity Significant Hospital Events: Including procedures, antibiotic start and stop dates in addition to other pertinent events   7/28 admitted encephalopathic with acute on chronic renal failure, profound metabolic acidosis, hypotension, and severe metabolic derangements.  Interim History / Subjective:  Awake following commands. Issues with HD catheter overnight, some repositioning needed and heparin started.  Objective   Blood pressure (!) 128/54, pulse (!) 108, temperature (!) 97 F (36.1 C), resp. rate (!) 32, height 5\' 2"  (1.575 m), weight 96.8 kg, SpO2 100 %.    Vent Mode: PRVC FiO2  (%):  [40 %-100 %] 40 % Set Rate:  [18 bmp-32 bmp] 32 bmp Vt Set:  [510 mL] 510 mL PEEP:  [5 cmH20] 5 cmH20 Plateau Pressure:  [19 cmH20-22 cmH20] 19 cmH20   Intake/Output Summary (Last 24 hours) at 01/17/2022 0720 Last data filed at 01/17/2022 0600 Gross per 24 hour  Intake 6479.3 ml  Output 353 ml  Net 6126.3 ml    Filed Weights   01/16/22 2009 01/17/22 0500  Weight: 96.8 kg 96.8 kg    Examination: No distress Lungs clear Heart sounds regular, ext warm Abd soft, hypoactive BS Moving all 4 ext purposefully, doing well on PS  ABG acidemic H/H dropped slightly  Resolved Hospital Problem list     Assessment & Plan:  Acute on chronic renal failure Acute metabolic encephalopathy Acute hypoxemic respiratory failure due to inability to protect airway, question aspiration pneumonia Circulatory shock mixed hypovolemic and acidemia induced, question septic DM2 with hyperglycemia Acute on chronic anemia Hx cataracts  - Lung protective tidal volumes limiting driving pressures to < 15cm H2O as able - Sedation titrated to vent compliance and patient comfort using PAD orderset - VAP prevention bundle - Daily SAT/SBT when meets institutional criteria: let settle today given high pressor needs and acidemia - Unasyn for aspiration - Bicarb gtt for now, dc once bicarb > 20 - Levo/vaso titrated for MAP 65 - Start levimir, TF, continue SSI - Check echo - Check AM iron studies - May need to place new HD catheter if current keeps malfunctioning  Best Practice (right click and "Reselect all SmartList Selections" daily)   Diet/type: TF  DVT prophylaxis: prophylactic heparin  GI prophylaxis: PPI Lines: N/A Foley:  Yes, and it is still needed Code Status:  full code Last date of multidisciplinary goals of care discussion [pending]  34 min cc time Erskine Emery MD PCCM

## 2022-01-17 NOTE — Progress Notes (Signed)
2mg  Alteplase reconstituted with 2.87ml of sterile water and instilled into arterial line on trialysis. At 0422, 2.2 mls removed from line and an additional 70mls of blood wasted. Line noted to have adequate patency.Re-initiation of CRRT perfomed.

## 2022-01-17 NOTE — Progress Notes (Signed)
PCCM Progress Note   Called to bedside by RT and RN stating patient has progressive hypoxia requiring increasing ventilator needed.  Currently on 100% FiO2 10 of PEEP with intermittent desaturations down to 84%.  Repeat chest x-ray reveals worsening left lower lobe infiltrate.  Decision made to escalate antibiotics and add stress dose steroids. Pressor requirement currently stable if this continues can try and start to remove volume per CRRT. If patient continues to desaturate may need to add NMB.   Keari Miu D. Kenton Kingfisher, NP-C [facility] Personal contact information can be found on Amion  01/17/2022, 6:04 PM

## 2022-01-17 NOTE — Progress Notes (Signed)
eLink Physician-Brief Progress Note Patient Name: Keith Garrett DOB: Sep 11, 1965 MRN: [id number]   Date of Service  01/17/2022- please see prior note for details   HPI/Events of Note  Keep clotting HD access   eICU Interventions  Will order tPA- if no benefit may need reposition of line         [place] Keith Garrett 01/17/2022, 3:06 AM

## 2022-01-17 NOTE — Progress Notes (Signed)
Pharmacy Antibiotic Note  Keith Garrett is a 56 y.o. male admitted on 01/16/2022 with  aspiration pneumonia .  Pharmacy has been consulted for vancomycin and cefepime dosing.   CT spine showing dependent airspace disease at lung apices concerning for aspiration. Patient presented with AKI; unclear if underlying chronic disease d/t longstanding DM and HTN. Scr 5.33 (15.7 on admission). CRRT was initiated and is currently running.  Patient currently intubated.    Plan: Start cefepime 2g Q12h IV  Vancomycin load 2000 mg IV  Maintenance vancomycin 1000 mg q24h  Obtain vanc level after 4th dose  Monitor CRRT tolerance, renal function, cx results, and clinical status  Height: 5\' 2"  (157.5 cm) Weight: 96.8 kg (213 lb 6.5 oz) IBW/kg (Calculated) : 54.6  Temp (24hrs), Avg:96.6 F (35.9 C), Min:93.2 F (34 C), Max:98.6 F (37 C)  Recent Labs  Lab 01/16/22 1710 01/16/22 1713 01/16/22 1715 01/16/22 2152 01/17/22 0345 01/17/22 1511  WBC  --  13.9*  --   --  25.7*  --   CREATININE 15.70* 12.61*  --  12.03* 10.77* 5.33*  LATICACIDVEN  --   --  >9.0* >9.0*  --   --      Estimated Creatinine Clearance: 15.5 mL/min (A) (by C-G formula based on SCr of 5.33 mg/dL (H)).    No Known Allergies  Antimicrobials this admission: Vancomycin 7/29 >>  Cefepime 7/29 >>  Dose adjustments this admission: Dose adjustment for vancomycin and cefepime   Microbiology results: 7/28 BCx: NGTD day 1  7/28 COVID/Flu PCR: neg  7/28 Sputum: sent  7/28 MRSA PCR: neg   Thank you for allowing pharmacy to be a part of this patient's care.   Gena Fray, PharmD PGY1 Pharmacy Resident   01/17/2022 5:43 PM  Please check AMION for all [place] phone numbers After 10:00 PM, call [place] [phone]

## 2022-01-17 NOTE — Progress Notes (Signed)
Subjective:  CRRT initiated overnight-  some catheter issues so have not made much progress with acidosis, CRRT running right now well.  But, he is alert and following commands this AM-  much better than last night-  is 6 liters positive but still req pressors  Objective Vital signs in last 24 hours: Vitals:   01/17/22 0530 01/17/22 0545 01/17/22 0600 01/17/22 0615  BP:   (!) 128/54   Pulse: (!) 114 (!) 107 (!) 108 (!) 108  Resp: (!) 24 (!) 32 (!) 32 (!) 32  Temp: 97.7 F (36.5 C) (!) 97.5 F (36.4 C) (!) 97.2 F (36.2 C) (!) 97 F (36.1 C)  TempSrc:      SpO2: 100% 100% 100% 100%  Weight:      Height:       Weight change:   Intake/Output Summary (Last 24 hours) at 01/17/2022 0742 Last data filed at 01/17/2022 0700 Gross per 24 hour  Intake 6694.7 ml  Output 353 ml  Net 6341.7 ml    Assessment/Plan: 56 year old hispanic male with DM, HTN now presenting with AKI and profound acidosis with hyperkalemia 1.Renal- last known crt was normal in 2021.  Difficult to know if this is acute, subacute  or chronic in the setting of longstanding DM and HTN. No urine available for review-  renal ultrasound looks normal.  Given his hypotension and lactate of 9 suspect major issue is hemodynamic at this point/ATN.  Attempted volume resuscitation, correction of acidosis to see if could correct hyperkalemia in the short term but now using CRRT to help correct acidosis and k-  started late 7/28.  Running well this AM  -  post filter bicarb but other fluids 2K 2. Hypertension/volume  - hypotensive and appears dry, was taking ACE-I as a home med-  aggressive volume resuscitation and pressors-  6 liters positive and still does not appear that overloaded-  cont absolute zero UF with CRRT 3.  Leukocytosis-  unclear if septic type picture-  started on unasyn 4. Metabolic acidosis- is due to AKI and lactate-  bicarb amps and bicarb based fluids-  now CRRT -  have not made remarkable progress just yet-  now that  CRRT is running hoping for more improvement-  stopping peripheral bicarb since getting with CRRT  5. Hyperkalemia -  due to AKI and acidosis -  given short term treatments and also lokelma -  is better 6. Anemia  - is pretty severe and with patient being dry as well.  Points to the fact that this could be CKD-  supportive care for now -  7.8 this AM     Keith Garrett    Labs: Basic Metabolic Panel: Recent Labs  Lab 01/16/22 1713 01/16/22 1742 01/16/22 2152 01/16/22 2309 01/17/22 0345 01/17/22 0541  NA 136   < > 138 136 139 134*  K 6.8*   < > 6.2* 5.6* 5.7* 5.3*  CL 107  --  104  --  101  --   CO2 <7*  --  <7*  --  <7*  --   GLUCOSE 142*  --  216*  --  258*  --   BUN 102*  --  97*  --  89*  --   CREATININE 12.61*  --  12.03*  --  10.77*  --   CALCIUM 7.7*  --  7.3*  --  6.9*  --   PHOS  --   --  9.7*  --  9.0*  --    < > =  values in this interval not displayed.   Liver Function Tests: Recent Labs  Lab 01/16/22 1713  AST 20  ALT 12  ALKPHOS 78  BILITOT 1.3*  PROT 6.4*  ALBUMIN 3.1*   No results for input(s): "LIPASE", "AMYLASE" in the last 168 hours. No results for input(s): "AMMONIA" in the last 168 hours. CBC: Recent Labs  Lab 01/16/22 1713 01/16/22 1742 01/16/22 2309 01/17/22 0345 01/17/22 0541  WBC 13.9*  --   --  25.7*  --   NEUTROABS 11.3*  --   --   --   --   HGB 8.6*   < > 8.5* 7.9* 7.8*  HCT 27.6*   < > 25.0* 24.9* 23.0*  MCV 98.2  --   --  97.6  --   PLT 265  --   --  197  --    < > = values in this interval not displayed.   Cardiac Enzymes: Recent Labs  Lab 01/16/22 1713  CKTOTAL 129   CBG: Recent Labs  Lab 01/17/22 0055 01/17/22 0340 01/17/22 0723  GLUCAP 226* 232* 189*    Iron Studies: No results for input(s): "IRON", "TIBC", "TRANSFERRIN", "FERRITIN" in the last 72 hours. Studies/Results: DG Chest Port 1 View  Result Date: 01/17/2022 CLINICAL DATA:  56 year old male with history of aspiration pneumonia. EXAM: PORTABLE  CHEST 1 VIEW COMPARISON:  Chest x-ray 01/16/2022. FINDINGS: An endotracheal tube is in place with tip 5.1 cm above the carina. There is a right-sided internal jugular central venous catheter with tip terminating in the distal superior vena cava. A nasogastric tube is seen extending into the stomach, however, the tip of the nasogastric tube extends below the lower margin of the image. Lung volumes are low. Opacity at the left base which may reflect atelectasis and/or consolidation, worsened compared to the prior study. Small left pleural effusion. Right lung is clear. No right pleural effusion. No pneumothorax. No evidence of pulmonary edema. Heart size is normal. Upper mediastinal contours are within normal limits. Atherosclerotic calcifications are noted in the thoracic aorta. IMPRESSION: 1. Support apparatus, as above. 2. Atelectasis and/or consolidation in the left lower lobe with small left pleural effusion. Electronically Signed   By: Vinnie Langton M.D.   On: 01/17/2022 07:22   US RENAL  Result Date: 01/16/2022 CLINICAL DATA:  Acute kidney injury EXAM: RENAL / URINARY TRACT ULTRASOUND COMPLETE COMPARISON:  None Available. FINDINGS: Right Kidney: Renal measurements: 12.0 x 5.7 x 5.7 cm = volume: 205 mL. Echogenicity within normal limits. No mass or hydronephrosis visualized. Left Kidney: Renal measurements: 13.9 x 6.7 x 6.6 cm = volume: 320 mL. Echogenicity within normal limits. No mass or hydronephrosis visualized. Bladder: Decompressed by Foley catheter. Other: None. IMPRESSION: 1. Normal appearance of kidneys. 2. Electronically Signed   By: Ronney Asters M.D.   On: 01/16/2022 22:56   DG CHEST PORT 1 VIEW  Result Date: 01/16/2022 CLINICAL DATA:  Central line placement. EXAM: PORTABLE CHEST 1 VIEW COMPARISON:  Earlier radiograph dated 01/16/2022. FINDINGS: Interval placement of a right IJ central venous line with tip over central SVC. No pneumothorax. Endotracheal tube remains above the carina.  Enteric tube extends below the diaphragm with tip beyond the inferior margin of the image. No interval change in bilateral pulmonary streaky densities since the earlier radiograph. Stable cardiomediastinal silhouette. No acute osseous pathology. IMPRESSION: Interval placement of a right IJ central venous line with tip over central SVC. No pneumothorax. Electronically Signed   By: Laren Everts.D.  On: 01/16/2022 21:09   CT Head Wo Contrast  Result Date: 01/16/2022 CLINICAL DATA:  Found unresponsive concern for trauma. EXAM: CT HEAD WITHOUT CONTRAST CT CERVICAL SPINE WITHOUT CONTRAST TECHNIQUE: Multidetector CT imaging of the head and cervical spine was performed following the standard protocol without intravenous contrast. Multiplanar CT image reconstructions of the cervical spine were also generated. RADIATION DOSE REDUCTION: This exam was performed according to the departmental dose-optimization program which includes automated exposure control, adjustment of the mA and/or kV according to patient size and/or use of iterative reconstruction technique. COMPARISON:  None Available. Cervical spine of November of 2022, CT of the head from 2011. FINDINGS: CT HEAD FINDINGS Brain: No evidence of hemorrhage, hydrocephalus, extra-axial collection or mass lesion/mass effect. 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 Signs of atrophy which has occurred since previous imaging. Vascular: No hyperdense vessel or unexpected calcification. Skull: Normal. Negative for fracture or focal lesion. Sinuses/Orbits: Scattered ethmoid opacification. Sinuses are otherwise clear. No acute orbital process. Other: None. CT CERVICAL SPINE FINDINGS Alignment: Straightening of normal cervical lordotic curvature and head turned to the LEFT, related to patient position. Skull base and vertebrae: No acute fracture. No primary bone lesion or focal pathologic process. Soft tissues and spinal canal: No  prevertebral fluid or swelling. No visible canal hematoma. Patient is intubated and the endotracheal tube is in the midtrachea. Disc levels: Mild degenerative changes throughout the cervical spine. Upper chest: Dependent airspace disease at the lung apices. Other: None IMPRESSION: 1. No acute intracranial abnormality. 2. 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 3. No acute fracture or traumatic subluxation of the cervical spine. 4. Dependent airspace disease at the lung apices, may represent volume loss or aspiration related change. Electronically Signed   By: Zetta Bills M.D.   On: 01/16/2022 17:56   CT Cervical Spine Wo Contrast  Result Date: 01/16/2022 CLINICAL DATA:  Found unresponsive concern for trauma. EXAM: CT HEAD WITHOUT CONTRAST CT CERVICAL SPINE WITHOUT CONTRAST TECHNIQUE: Multidetector CT imaging of the head and cervical spine was performed following the standard protocol without intravenous contrast. Multiplanar CT image reconstructions of the cervical spine were also generated. RADIATION DOSE REDUCTION: This exam was performed according to the departmental dose-optimization program which includes automated exposure control, adjustment of the mA and/or kV according to patient size and/or use of iterative reconstruction technique. COMPARISON:  None Available. Cervical spine of November of 2022, CT of the head from 2011. FINDINGS: CT HEAD FINDINGS Brain: No evidence of hemorrhage, hydrocephalus, extra-axial collection or mass lesion/mass effect. 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 Signs of atrophy which has occurred since previous imaging. Vascular: No hyperdense vessel or unexpected calcification. Skull: Normal. Negative for fracture or focal lesion. Sinuses/Orbits: Scattered ethmoid opacification. Sinuses are otherwise clear. No acute orbital process. Other: None. CT CERVICAL SPINE FINDINGS Alignment: Straightening of  normal cervical lordotic curvature and head turned to the LEFT, related to patient position. Skull base and vertebrae: No acute fracture. No primary bone lesion or focal pathologic process. Soft tissues and spinal canal: No prevertebral fluid or swelling. No visible canal hematoma. Patient is intubated and the endotracheal tube is in the midtrachea. Disc levels: Mild degenerative changes throughout the cervical spine. Upper chest: Dependent airspace disease at the lung apices. Other: None IMPRESSION: 1. No acute intracranial abnormality. 2. 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 3. No acute fracture or traumatic subluxation of the cervical spine. 4. Dependent  airspace disease at the lung apices, may represent volume loss or aspiration related change. Electronically Signed   By: Zetta Bills M.D.   On: 01/16/2022 17:56   DG Chest Portable 1 View  Result Date: 01/16/2022 CLINICAL DATA:  Intubation. EXAM: PORTABLE CHEST 1 VIEW COMPARISON:  Chest x-ray 04/08/2020 FINDINGS: Endotracheal tube tip is 3.4 cm above the carina. The cardiomediastinal silhouette is within normal limits. Lung volumes are low likely accentuating central pulmonary vascularity. There is no focal lung infiltrate, pleural effusion or pneumothorax. No acute fractures are seen. IMPRESSION: 1. Endotracheal tube tip is 3.4 cm above the carina. 2. Low lung volumes. Electronically Signed   By: Ronney Asters M.D.   On: 01/16/2022 17:22   Medications: Infusions:  sodium chloride Stopped (01/16/22 1909)   sodium chloride     sodium chloride     ampicillin-sulbactam (UNASYN) IV Stopped (01/17/22 0556)   fentaNYL infusion INTRAVENOUS 200 mcg/hr (01/17/22 0700)   heparin 10,000 units/ 20 mL infusion syringe 450 Units/hr (01/17/22 0724)   norepinephrine (LEVOPHED) Adult infusion 40 mcg/min (01/17/22 0700)   prismasol BGK 2/2.5 dialysis solution 1,500 mL/hr at 01/17/22 0222   prismasol BGK 2/2.5 replacement  solution 400 mL/hr at 01/16/22 2148   sodium bicarbonate 150 mEq in sterile water 1,150 mL infusion 150 mL/hr at 01/17/22 0700   sodium bicarbonate 150 mEq in sterile water 1,150 mL infusion 150 mL/hr at 01/17/22 0222   vasopressin 0.03 Units/min (01/17/22 0700)    Scheduled Medications:  Chlorhexidine Gluconate Cloth  6 each Topical Daily   docusate  100 mg Per Tube BID   dorzolamide-timolol  1 drop Left Eye BID   feeding supplement (PROSource TF)  45 mL Per Tube BID   feeding supplement (VITAL HIGH PROTEIN)  1,000 mL Per Tube Q24H   heparin  5,000 Units Subcutaneous Q8H   insulin aspart  0-20 Units Subcutaneous Q4H   insulin detemir  15 Units Subcutaneous BID   mouth rinse  15 mL Mouth Rinse Q2H   pantoprazole (PROTONIX) IV  40 mg Intravenous QHS   polyethylene glycol  17 g Per Tube Daily   sodium zirconium cyclosilicate  10 g Per Tube NOW    have reviewed scheduled and prn medications.  Physical Exam: General: on vent but alert and following commands Heart: tachy Lungs: dec BS at left Abdomen: slightly distended-  he reports a little pain this AM Extremities: really no edema Dialysis Access: right IJ vascath placed 7/28    01/17/2022,7:42 AM  LOS: 1 day

## 2022-01-17 NOTE — Progress Notes (Signed)
Initial Nutrition Assessment  DOCUMENTATION CODES:   Obesity unspecified  INTERVENTION:   Tube feeds via OG tube: Transition to Vital AF 1.2 at 45 mL/hr, advance by 10 mL q4h until goal rate of 75 mL/hr (1800 mL/day) 45 mL ProSource TF - daily Provides 2200 kcal, 146 gm protein, and 1460 mL free water daily. Renal Multivitamin w/ minerals daily  NUTRITION DIAGNOSIS:   Inadequate oral intake related to inability to eat as evidenced by NPO status.  GOAL:   Patient will meet greater than or equal to 90% of their needs  MONITOR:   Vent status, Labs, I & O's, TF tolerance  REASON FOR ASSESSMENT:   Consult Assessment of nutrition requirement/status  ASSESSMENT:   56 y.o. male presented to the ED after being found down. Was intubated on arrival due to inability to protect his airway. PMH includes T2DM, CKD, and HTN. Pt admitted with acute metabolic encephalopathy, AKI on CKD, and metabolic acidosis.   Started on CRRT overnight.  RD unable to obtain any nutrition related history at this time due to pt being intubated and RD working remotely. This RD reached out to MD to adjust TF regimen: MD approved.  Patient is currently intubated on ventilator support. MV: 16.1 L/min MAP (a-line): 70 Temp (24hrs), Avg:96.5 F (35.8 C), Min:93.2 F (34 C), Max:98.6 F (37 C) Continuous Medications: Fentanyl Levophed Vasopressin  Medications reviewed and include: Colace, NovoLog,Levemir, Protonix, Miralax, Lokelma, IV antibiotics Labs reviewed: BUN 89, Creatinine 10.77, Phosphorus 9.0, Hgb A1c 6.9%, 24 hr CBG 110-232  NUTRITION - FOCUSED PHYSICAL EXAM:  Deferred to follow-up.   Diet Order:   Diet Order             Diet NPO time specified  Diet effective now                   EDUCATION NEEDS:   Not appropriate for education at this time  Skin:  Skin Assessment: Reviewed RN Assessment  Last BM:  Unknown  Height:   Ht Readings from Last 1 Encounters:  01/17/22 5'  2" (1.575 m)    Weight:   Wt Readings from Last 1 Encounters:  01/17/22 96.8 kg    Ideal Body Weight:  53.6 kg  BMI:  Body mass index is 39.03 kg/m.  Estimated Nutritional Needs:   Kcal:  2100-2300  Protein:  140-160  Fluid:  >/= 2 L    Hermina Barters RD, LDN Clinical Dietitian See [facility] for contact information.

## 2022-01-18 ENCOUNTER — Inpatient Hospital Stay (HOSPITAL_COMMUNITY): Payer: Self-pay

## 2022-01-18 LAB — CBC
HCT: 22.8 % — ABNORMAL LOW (ref 39.0–52.0)
Hemoglobin: 7.8 g/dL — ABNORMAL LOW (ref 13.0–17.0)
MCH: 30.7 pg (ref 26.0–34.0)
MCHC: 34.2 g/dL (ref 30.0–36.0)
MCV: 89.8 fL (ref 80.0–100.0)
Platelets: 159 10*3/uL (ref 150–400)
RBC: 2.54 MIL/uL — ABNORMAL LOW (ref 4.22–5.81)
RDW: 13.4 % (ref 11.5–15.5)
WBC: 13.7 10*3/uL — ABNORMAL HIGH (ref 4.0–10.5)
nRBC: 0 % (ref 0.0–0.2)

## 2022-01-18 LAB — POCT I-STAT 7, (LYTES, BLD GAS, ICA,H+H)
Acid-Base Excess: 0 mmol/L (ref 0.0–2.0)
Acid-base deficit: 1 mmol/L (ref 0.0–2.0)
Acid-base deficit: 1 mmol/L (ref 0.0–2.0)
Acid-base deficit: 2 mmol/L (ref 0.0–2.0)
Acid-base deficit: 2 mmol/L (ref 0.0–2.0)
Acid-base deficit: 4 mmol/L — ABNORMAL HIGH (ref 0.0–2.0)
Acid-base deficit: 4 mmol/L — ABNORMAL HIGH (ref 0.0–2.0)
Bicarbonate: 22.9 mmol/L (ref 20.0–28.0)
Bicarbonate: 23.4 mmol/L (ref 20.0–28.0)
Bicarbonate: 23.7 mmol/L (ref 20.0–28.0)
Bicarbonate: 24.1 mmol/L (ref 20.0–28.0)
Bicarbonate: 24.5 mmol/L (ref 20.0–28.0)
Bicarbonate: 24.6 mmol/L (ref 20.0–28.0)
Bicarbonate: 24.8 mmol/L (ref 20.0–28.0)
Calcium, Ion: 0.92 mmol/L — ABNORMAL LOW (ref 1.15–1.40)
Calcium, Ion: 0.92 mmol/L — ABNORMAL LOW (ref 1.15–1.40)
Calcium, Ion: 0.93 mmol/L — ABNORMAL LOW (ref 1.15–1.40)
Calcium, Ion: 0.94 mmol/L — ABNORMAL LOW (ref 1.15–1.40)
Calcium, Ion: 0.94 mmol/L — ABNORMAL LOW (ref 1.15–1.40)
Calcium, Ion: 0.95 mmol/L — ABNORMAL LOW (ref 1.15–1.40)
Calcium, Ion: 0.95 mmol/L — ABNORMAL LOW (ref 1.15–1.40)
HCT: 21 % — ABNORMAL LOW (ref 39.0–52.0)
HCT: 22 % — ABNORMAL LOW (ref 39.0–52.0)
HCT: 23 % — ABNORMAL LOW (ref 39.0–52.0)
HCT: 23 % — ABNORMAL LOW (ref 39.0–52.0)
HCT: 23 % — ABNORMAL LOW (ref 39.0–52.0)
HCT: 24 % — ABNORMAL LOW (ref 39.0–52.0)
HCT: 35 % — ABNORMAL LOW (ref 39.0–52.0)
Hemoglobin: 11.9 g/dL — ABNORMAL LOW (ref 13.0–17.0)
Hemoglobin: 7.1 g/dL — ABNORMAL LOW (ref 13.0–17.0)
Hemoglobin: 7.5 g/dL — ABNORMAL LOW (ref 13.0–17.0)
Hemoglobin: 7.8 g/dL — ABNORMAL LOW (ref 13.0–17.0)
Hemoglobin: 7.8 g/dL — ABNORMAL LOW (ref 13.0–17.0)
Hemoglobin: 7.8 g/dL — ABNORMAL LOW (ref 13.0–17.0)
Hemoglobin: 8.2 g/dL — ABNORMAL LOW (ref 13.0–17.0)
O2 Saturation: 84 %
O2 Saturation: 84 %
O2 Saturation: 85 %
O2 Saturation: 90 %
O2 Saturation: 97 %
O2 Saturation: 97 %
O2 Saturation: 99 %
Patient temperature: 36
Patient temperature: 36
Patient temperature: 36.7
Patient temperature: 36.8
Patient temperature: 36.8
Patient temperature: 37.1
Patient temperature: 37.3
Potassium: 3.6 mmol/L (ref 3.5–5.1)
Potassium: 3.9 mmol/L (ref 3.5–5.1)
Potassium: 4.2 mmol/L (ref 3.5–5.1)
Potassium: 4.2 mmol/L (ref 3.5–5.1)
Potassium: 4.3 mmol/L (ref 3.5–5.1)
Potassium: 4.3 mmol/L (ref 3.5–5.1)
Potassium: 4.4 mmol/L (ref 3.5–5.1)
Sodium: 133 mmol/L — ABNORMAL LOW (ref 135–145)
Sodium: 134 mmol/L — ABNORMAL LOW (ref 135–145)
Sodium: 134 mmol/L — ABNORMAL LOW (ref 135–145)
Sodium: 135 mmol/L (ref 135–145)
Sodium: 135 mmol/L (ref 135–145)
Sodium: 138 mmol/L (ref 135–145)
Sodium: 140 mmol/L (ref 135–145)
TCO2: 24 mmol/L (ref 22–32)
TCO2: 25 mmol/L (ref 22–32)
TCO2: 25 mmol/L (ref 22–32)
TCO2: 25 mmol/L (ref 22–32)
TCO2: 26 mmol/L (ref 22–32)
TCO2: 26 mmol/L (ref 22–32)
TCO2: 26 mmol/L (ref 22–32)
pCO2 arterial: 37.2 mmHg (ref 32–48)
pCO2 arterial: 40.9 mmHg (ref 32–48)
pCO2 arterial: 45 mmHg (ref 32–48)
pCO2 arterial: 46.5 mmHg (ref 32–48)
pCO2 arterial: 46.9 mmHg (ref 32–48)
pCO2 arterial: 47.6 mmHg (ref 32–48)
pCO2 arterial: 49.4 mmHg — ABNORMAL HIGH (ref 32–48)
pH, Arterial: 7.277 — ABNORMAL LOW (ref 7.35–7.45)
pH, Arterial: 7.289 — ABNORMAL LOW (ref 7.35–7.45)
pH, Arterial: 7.322 — ABNORMAL LOW (ref 7.35–7.45)
pH, Arterial: 7.33 — ABNORMAL LOW (ref 7.35–7.45)
pH, Arterial: 7.336 — ABNORMAL LOW (ref 7.35–7.45)
pH, Arterial: 7.392 (ref 7.35–7.45)
pH, Arterial: 7.413 (ref 7.35–7.45)
pO2, Arterial: 120 mmHg — ABNORMAL HIGH (ref 83–108)
pO2, Arterial: 51 mmHg — ABNORMAL LOW (ref 83–108)
pO2, Arterial: 52 mmHg — ABNORMAL LOW (ref 83–108)
pO2, Arterial: 55 mmHg — ABNORMAL LOW (ref 83–108)
pO2, Arterial: 60 mmHg — ABNORMAL LOW (ref 83–108)
pO2, Arterial: 89 mmHg (ref 83–108)
pO2, Arterial: 93 mmHg (ref 83–108)

## 2022-01-18 LAB — POCT ACTIVATED CLOTTING TIME
Activated Clotting Time: 179 seconds
Activated Clotting Time: 179 seconds
Activated Clotting Time: 185 seconds
Activated Clotting Time: 203 seconds
Activated Clotting Time: 203 seconds
Activated Clotting Time: 215 seconds
Activated Clotting Time: 245 seconds
Activated Clotting Time: 245 seconds
Activated Clotting Time: 257 seconds
Activated Clotting Time: 269 seconds
Activated Clotting Time: 263 seconds
Activated Clotting Time: 251 seconds
Activated Clotting Time: 245 seconds
Activated Clotting Time: 227 seconds
Activated Clotting Time: 221 seconds
Activated Clotting Time: 209 seconds
Activated Clotting Time: 197 seconds

## 2022-01-18 LAB — IRON AND TIBC
Iron: 20 ug/dL — ABNORMAL LOW (ref 45–182)
Saturation Ratios: 10 % — ABNORMAL LOW (ref 17.9–39.5)
TIBC: 193 ug/dL — ABNORMAL LOW (ref 250–450)
UIBC: 173 ug/dL

## 2022-01-18 LAB — BASIC METABOLIC PANEL
Anion gap: 10 (ref 5–15)
Anion gap: 9 (ref 5–15)
BUN: 40 mg/dL — ABNORMAL HIGH (ref 6–20)
BUN: 34 mg/dL — ABNORMAL HIGH (ref 6–20)
CO2: 24 mmol/L (ref 22–32)
CO2: 26 mmol/L (ref 22–32)
Calcium: 6.4 mg/dL — CL (ref 8.9–10.3)
Calcium: 7.2 mg/dL — ABNORMAL LOW (ref 8.9–10.3)
Chloride: 102 mmol/L (ref 98–111)
Chloride: 102 mmol/L (ref 98–111)
Creatinine, Ser: 4.23 mg/dL — ABNORMAL HIGH (ref 0.61–1.24)
Creatinine, Ser: 3.13 mg/dL — ABNORMAL HIGH (ref 0.61–1.24)
GFR, Estimated: 16 mL/min — ABNORMAL LOW (ref >60)
GFR, Estimated: 22 mL/min — ABNORMAL LOW (ref >60)
Glucose, Bld: 254 mg/dL — ABNORMAL HIGH (ref 70–99)
Glucose, Bld: 199 mg/dL — ABNORMAL HIGH (ref 70–99)
Potassium: 4.3 mmol/L (ref 3.5–5.1)
Potassium: 4 mmol/L (ref 3.5–5.1)
Sodium: 136 mmol/L (ref 135–145)
Sodium: 137 mmol/L (ref 135–145)

## 2022-01-18 LAB — GLUCOSE, CAPILLARY
Glucose-Capillary: 182 mg/dL — ABNORMAL HIGH (ref 70–99)
Glucose-Capillary: 191 mg/dL — ABNORMAL HIGH (ref 70–99)
Glucose-Capillary: 205 mg/dL — ABNORMAL HIGH (ref 70–99)
Glucose-Capillary: 243 mg/dL — ABNORMAL HIGH (ref 70–99)
Glucose-Capillary: 264 mg/dL — ABNORMAL HIGH (ref 70–99)
Glucose-Capillary: 274 mg/dL — ABNORMAL HIGH (ref 70–99)

## 2022-01-18 LAB — RENAL FUNCTION PANEL
Albumin: 2.7 g/dL — ABNORMAL LOW (ref 3.5–5.0)
Albumin: 2.6 g/dL — ABNORMAL LOW (ref 3.5–5.0)
Anion gap: 15 (ref 5–15)
Anion gap: 8 (ref 5–15)
BUN: 47 mg/dL — ABNORMAL HIGH (ref 6–20)
BUN: 37 mg/dL — ABNORMAL HIGH (ref 6–20)
CO2: 23 mmol/L (ref 22–32)
CO2: 25 mmol/L (ref 22–32)
Calcium: 6.6 mg/dL — ABNORMAL LOW (ref 8.9–10.3)
Calcium: 6.9 mg/dL — ABNORMAL LOW (ref 8.9–10.3)
Chloride: 95 mmol/L — ABNORMAL LOW (ref 98–111)
Chloride: 102 mmol/L (ref 98–111)
Creatinine, Ser: 4.93 mg/dL — ABNORMAL HIGH (ref 0.61–1.24)
Creatinine, Ser: 3.54 mg/dL — ABNORMAL HIGH (ref 0.61–1.24)
GFR, Estimated: 13 mL/min — ABNORMAL LOW (ref >60)
GFR, Estimated: 19 mL/min — ABNORMAL LOW (ref >60)
Glucose, Bld: 267 mg/dL — ABNORMAL HIGH (ref 70–99)
Glucose, Bld: 212 mg/dL — ABNORMAL HIGH (ref 70–99)
Phosphorus: 4.3 mg/dL (ref 2.5–4.6)
Phosphorus: 2.9 mg/dL (ref 2.5–4.6)
Potassium: 4.2 mmol/L (ref 3.5–5.1)
Potassium: 4 mmol/L (ref 3.5–5.1)
Sodium: 133 mmol/L — ABNORMAL LOW (ref 135–145)
Sodium: 135 mmol/L (ref 135–145)

## 2022-01-18 LAB — FERRITIN: Ferritin: 200 ng/mL (ref 24–336)

## 2022-01-18 LAB — MAGNESIUM: Magnesium: 1.9 mg/dL (ref 1.7–2.4)

## 2022-01-18 LAB — URINALYSIS, COMPLETE (UACMP) WITH MICROSCOPIC
Bacteria, UA: NONE SEEN
Bilirubin Urine: NEGATIVE
Glucose, UA: >=500 mg/dL — AB
Ketones, ur: 5 mg/dL — AB
Leukocytes,Ua: NEGATIVE
Nitrite: NEGATIVE
Protein, ur: 30 mg/dL — AB
Specific Gravity, Urine: 1.006 (ref 1.005–1.030)
pH: 7 (ref 5.0–8.0)

## 2022-01-18 LAB — LACTIC ACID, PLASMA: Lactic Acid, Venous: 4.8 mmol/L (ref 0.5–1.9)

## 2022-01-18 LAB — APTT: aPTT: >200 seconds (ref 24–36)

## 2022-01-18 LAB — PHOSPHORUS: Phosphorus: 3.9 mg/dL (ref 2.5–4.6)

## 2022-01-18 MED ORDER — CALCIUM GLUCONATE-NACL 1-0.675 GM/50ML-% IV SOLN
1.0000 g | Freq: Once | INTRAVENOUS | Status: AC
  Administered 2022-01-18: 1000 mg via INTRAVENOUS
  Filled 2022-01-18: qty 50

## 2022-01-18 MED ORDER — SODIUM CHLORIDE 0.9 % IV SOLN
250.0000 mg | Freq: Every day | INTRAVENOUS | Status: AC
  Administered 2022-01-18 – 2022-01-19 (×2): 250 mg via INTRAVENOUS
  Filled 2022-01-18 (×2): qty 20

## 2022-01-18 MED ORDER — INSULIN ASPART 100 UNIT/ML IJ SOLN
4.0000 [IU] | INTRAMUSCULAR | Status: DC
  Administered 2022-01-18 – 2022-01-21 (×15): 4 [IU] via SUBCUTANEOUS

## 2022-01-18 MED ORDER — INSULIN ASPART 100 UNIT/ML IJ SOLN: 5.0000 [IU] | INTRAMUSCULAR | Status: DC

## 2022-01-18 MED ORDER — PRISMASOL BGK 4/2.5 32-4-2.5 MEQ/L REPLACEMENT SOLN: Status: DC

## 2022-01-18 MED ORDER — PRISMASOL BGK 4/2.5 32-4-2.5 MEQ/L EC SOLN: Status: DC

## 2022-01-18 MED ORDER — SODIUM CHLORIDE 0.9 % IV SOLN
3.0000 g | Freq: Three times a day (TID) | INTRAVENOUS | Status: DC
  Administered 2022-01-18 – 2022-01-20 (×7): 3 g via INTRAVENOUS
  Filled 2022-01-18 (×7): qty 8

## 2022-01-18 MED ORDER — DOXYCYCLINE HYCLATE 100 MG PO TABS
100.0000 mg | ORAL_TABLET | Freq: Two times a day (BID) | ORAL | Status: DC
  Administered 2022-01-18 – 2022-01-21 (×8): 100 mg
  Filled 2022-01-18 (×7): qty 1

## 2022-01-18 MED ORDER — INSULIN DETEMIR 100 UNIT/ML ~~LOC~~ SOLN
20.0000 [IU] | Freq: Two times a day (BID) | SUBCUTANEOUS | Status: DC
  Administered 2022-01-18 (×2): 20 [IU] via SUBCUTANEOUS
  Filled 2022-01-18 (×4): qty 0.2

## 2022-01-18 NOTE — Progress Notes (Signed)
eLink Physician-Brief Progress Note Patient Name: Keith Garrett DOB: Apr 11, 1966 MRN: [id number]   Date of Service  01/18/2022  HPI/Events of Note  Worsening hypoxemia despite improved cxr.  I suspect shunting through atelectatic lower 1/3 of bilateral lungs and edema.  He was not peep responsive.  eICU Interventions  Increase TV Continue with fluid removal Repeat abg 1 hour     Intervention Category Major Interventions: Hypoxemia - evaluation and management  Mauri Brooklyn, P 01/18/2022, 1:47 AM

## 2022-01-18 NOTE — Progress Notes (Signed)
Subjective:   UOP has picked up quite a bit overnight-  becoming more hypoxic with CXR showing worsening PNA and fluid, concern that is developing ARDS-   started to pull with CRRT but only about 700 negative so far -  coming off of pressors-  bicarb up to 23   Objective Vital signs in last 24 hours: Vitals:   01/18/22 0500 01/18/22 0600 01/18/22 0700 01/18/22 0723  BP: 115/60 139/60 125/62   Pulse: 87 90 89   Resp: (!) 28 (!) 28 (!) 28   Temp: (!) 97.2 F (36.2 C) (!) 96.8 F (36 C) (!) 97.3 F (36.3 C)   TempSrc: Bladder Bladder Bladder   SpO2: (!) 87% 93% 92% 96%  Weight:      Height:       Weight change: 5.3 kg  Intake/Output Summary (Last 24 hours) at 01/18/2022 0728 Last data filed at 01/18/2022 0700 Gross per 24 hour  Intake 4187.56 ml  Output 4878 ml  Net -690.44 ml    Assessment/Plan: 56 year old hispanic male with DM, HTN now presenting with AKI and profound acidosis with hyperkalemia 1.Renal- last known crt was normal in 2021.  Difficult to know if this is acute, subacute  or chronic in the setting of longstanding DM and HTN. No urine available for review initially but will send today-   renal ultrasound  normal.  Given his hypotension and lactate of 9 major issue is hemodynamic at this point/ATN.   using CRRT to help correct acidosis and k-  started late 7/28.  Running well this AM  -  post filter bicarb but other fluids 2K.  Now making urine, lots of if. Need to keep on CRRT for now given critical illness and now needs better volume control after resuscitation.  Since bicarb and K normalized will change to all 4 k bags 2. Hypertension/volume  - hypotensive and dry initially, was taking ACE-I as a home med-  aggressive volume resuscitation and pressors-  6 liters positive and still did not appear that overloaded but now hypoxic and I suspect some of that is volume-  will change UF on CRRT to at least 100 with instructions to go up to 150 3.  Leukocytosis-  unclear if septic  type picture-  now looking like PNA-  abx broadened to cefepime and vanc 4. Metabolic acidosis-  due to AKI and lactate-  bicarb amps and bicarb based fluids-  now CRRT -  correcting nicely -  change bags to all 4 K  5. Hyperkalemia -  due to AKI and acidosis -  given short term treatments and also lokelma -  is better 6. Anemia  - is pretty severe and with patient being dry as well.  Points to the fact that this could be CKD-  supportive care for now -  conflicting data-   reported from 7.8 to 11.9 this AM-  suspect the 7.8 is right-  transfuse as needed     Louis Meckel    Labs: Basic Metabolic Panel: Recent Labs  Lab 01/17/22 0345 01/17/22 0541 01/17/22 1511 01/18/22 0014 01/18/22 0317 01/18/22 0513 01/18/22 0538  NA 139   < > 140   < > 134* 133* 133*  K 5.7*   < > 3.4*   < > 4.2 4.2 4.2  CL 101  --  112*  --   --  95*  --   CO2 <7*  --  16*  --   --  23  --   GLUCOSE 258*  --  65*  --   --  267*  --   BUN 89*  --  48*  --   --  47*  --   CREATININE 10.77*  --  5.33*  --   --  4.93*  --   CALCIUM 6.9*  --  5.1*  --   --  6.6*  --   PHOS 9.0*  --  3.9  --   --  4.3  3.9  --    < > = values in this interval not displayed.   Liver Function Tests: Recent Labs  Lab 01/16/22 1713 01/17/22 1511 01/18/22 0513  AST 20  --   --   ALT 12  --   --   ALKPHOS 78  --   --   BILITOT 1.3*  --   --   PROT 6.4*  --   --   ALBUMIN 3.1* 2.1* 2.7*   No results for input(s): "LIPASE", "AMYLASE" in the last 168 hours. No results for input(s): "AMMONIA" in the last 168 hours. CBC: Recent Labs  Lab 01/16/22 1713 01/16/22 1742 01/17/22 0345 01/17/22 0541 01/18/22 0317 01/18/22 0513 01/18/22 0538  WBC 13.9*  --  25.7*  --   --  13.7*  --   NEUTROABS 11.3*  --   --   --   --   --   --   HGB 8.6*   < > 7.9*   < > 8.2* 7.8* 11.9*  HCT 27.6*   < > 24.9*   < > 24.0* 22.8* 35.0*  MCV 98.2  --  97.6  --   --  89.8  --   PLT 265  --  197  --   --  159  --    < > = values in  this interval not displayed.   Cardiac Enzymes: Recent Labs  Lab 01/16/22 1713  CKTOTAL 129   CBG: Recent Labs  Lab 01/17/22 1507 01/17/22 2024 01/17/22 2318 01/18/22 0012 01/18/22 0535  GLUCAP 85 114* 158* 191* 243*    Iron Studies:  Recent Labs    01/18/22 0513  IRON 20*  TIBC 193*  FERRITIN 200   Studies/Results: DG CHEST PORT 1 VIEW  Result Date: 01/18/2022 CLINICAL DATA:  Shortness of breath. EXAM: PORTABLE CHEST 1 VIEW COMPARISON:  Earlier radiograph dated 01/17/2022. FINDINGS: Endotracheal tube remains above the carina. Right IJ central venous line in similar position. Enteric tube extends below the diaphragm with tip beyond the inferior margin of the image. Shallow inspiration with bibasilar atelectasis versus infiltrate, left greater right. Overall interval improvement of the aeration of the lungs compared to prior radiograph. No pneumothorax. Possible trace pleural effusions. Stable cardiomediastinal silhouette. No acute osseous pathology. IMPRESSION: Shallow inspiration with bibasilar atelectasis versus infiltrate, left greater right. Overall interval improvement of the aeration of the lungs compared to prior radiograph. Electronically Signed   By: Anner Crete M.D.   On: 01/18/2022 00:43   DG CHEST PORT 1 VIEW  Result Date: 01/17/2022 CLINICAL DATA:  Atelectasis. EXAM: PORTABLE CHEST 1 VIEW COMPARISON:  January 17, 2022 FINDINGS: The ETT is in good position. The NG tube terminates below today's film. The right central line is stable. No pneumothorax. The right lung is clear. Haziness over the left mid lower chest is increased. No other acute abnormalities. IMPRESSION: 1. Support apparatus as above. 2. Increased haziness in the left mid lower lung is favored to represent layering  effusion with underlying atelectasis. Developing infiltrate is possible as well. Recommend clinical correlation and attention on short-term follow-up. Electronically Signed   By: Dorise Bullion  III M.D.   On: 01/17/2022 16:49   ECHOCARDIOGRAM COMPLETE  Result Date: 01/17/2022    ECHOCARDIOGRAM REPORT   Patient Name:   TAJON MORING Date of Exam: 01/17/2022 Medical Rec #:  [id number]        Height:       62.0 in Accession #:    [id number]       Weight:       213.4 lb Date of Birth:  Aug 29, 1965        BSA:          1.965 m Patient Age:    64 years         BP:           129/62 mmHg Patient Gender: M                HR:           99 bpm. Exam Location:  Inpatient Procedure: 2D Echo, Cardiac Doppler, Color Doppler and Intracardiac            Opacification Agent Indications:    I42.9 Cardiomyopathy (unspecified)  History:        Patient has prior history of Echocardiogram examinations, most                 recent 04/16/2020. TIA; Signs/Symptoms:Dyspnea.  Sonographer:    Roseanna Rainbow RDCS Referring Phys: [id number] Candee Furbish  Sonographer Comments: Technically difficult study due to poor echo windows, patient is morbidly obese and echo performed with patient supine and on artificial respirator. Image acquisition challenging due to patient body habitus. IMPRESSIONS  1. Left ventricular ejection fraction, by estimation, is 65 to 70%. The left ventricle has hyperdynamic function. The left ventricle has no regional wall motion abnormalities. Left ventricular diastolic parameters were normal.  2. Right ventricular systolic function is hyperdynamic. The right ventricular size is normal. There is mildly elevated pulmonary artery systolic pressure. The estimated right ventricular systolic pressure is 67.3 mmHg.  3. Left atrial size was moderately dilated.  4. The mitral valve is normal in structure. No evidence of mitral valve regurgitation.  5. The aortic valve is tricuspid. There is mild calcification of the aortic valve. There is mild thickening of the aortic valve. Aortic valve regurgitation is not visualized. Aortic valve sclerosis/calcification is present, without any evidence of aortic stenosis.  6. The inferior  vena cava is dilated in size with <50% respiratory variability, suggesting right atrial pressure of 15 mmHg. Comparison(s): No significant change from prior study. Prior images reviewed side by side. FINDINGS  Left Ventricle: Left ventricular ejection fraction, by estimation, is 65 to 70%. The left ventricle has hyperdynamic function. The left ventricle has no regional wall motion abnormalities. Definity contrast agent was given IV to delineate the left ventricular endocardial borders. The left ventricular internal cavity size was normal in size. There is borderline concentric left ventricular hypertrophy. Left ventricular diastolic parameters were normal. Normal left ventricular filling pressure. Right Ventricle: The right ventricular size is normal. No increase in right ventricular wall thickness. Right ventricular systolic function is hyperdynamic. There is mildly elevated pulmonary artery systolic pressure. The tricuspid regurgitant velocity is 2.61 m/s, and with an assumed right atrial pressure of 15 mmHg, the estimated right ventricular systolic pressure is 41.9 mmHg. Left Atrium: Left atrial size was moderately dilated. Right Atrium: Right  atrial size was normal in size. Pericardium: There is no evidence of pericardial effusion. Mitral Valve: The mitral valve is normal in structure. Mild to moderate mitral annular calcification. No evidence of mitral valve regurgitation. Tricuspid Valve: The tricuspid valve is normal in structure. Tricuspid valve regurgitation is trivial. Aortic Valve: The aortic valve is tricuspid. There is mild calcification of the aortic valve. There is mild thickening of the aortic valve. Aortic valve regurgitation is not visualized. Aortic valve sclerosis/calcification is present, without any evidence of aortic stenosis. Aortic valve mean gradient measures 13.6 mmHg. Aortic valve peak gradient measures 24.6 mmHg. Aortic valve area, by VTI measures 2.10 cm. Pulmonic Valve: The pulmonic  valve was grossly normal. Pulmonic valve regurgitation is not visualized. Aorta: The aortic root and ascending aorta are structurally normal, with no evidence of dilitation. Venous: The inferior vena cava is dilated in size with less than 50% respiratory variability, suggesting right atrial pressure of 15 mmHg. IAS/Shunts: No atrial level shunt detected by color flow Doppler.  LEFT VENTRICLE PLAX 2D LVIDd:         5.00 cm      Diastology LVIDs:         2.80 cm      LV e' medial:    11.60 cm/s LV PW:         1.20 cm      LV E/e' medial:  7.3 LV IVS:        1.20 cm      LV e' lateral:   9.14 cm/s LVOT diam:     1.90 cm      LV E/e' lateral: 9.2 LV SV:         81 LV SV Index:   41 LVOT Area:     2.84 cm  LV Volumes (MOD) LV vol d, MOD A2C: 94.7 ml LV vol d, MOD A4C: 118.0 ml LV vol s, MOD A2C: 33.7 ml LV vol s, MOD A4C: 41.1 ml LV SV MOD A2C:     61.0 ml LV SV MOD A4C:     118.0 ml LV SV MOD BP:      71.0 ml RIGHT VENTRICLE             IVC RV S prime:     22.10 cm/s  IVC diam: 2.80 cm TAPSE (M-mode): 2.4 cm LEFT ATRIUM             Index        RIGHT ATRIUM           Index LA diam:        4.70 cm 2.39 cm/m   RA Area:     11.20 cm LA Vol (A2C):   34.0 ml 17.30 ml/m  RA Volume:   19.80 ml  10.08 ml/m LA Vol (A4C):   45.3 ml 23.05 ml/m LA Biplane Vol: 42.2 ml 21.47 ml/m  AORTIC VALVE AV Area (Vmax):    1.98 cm AV Area (Vmean):   2.00 cm AV Area (VTI):     2.10 cm AV Vmax:           248.00 cm/s AV Vmean:          170.400 cm/s AV VTI:            0.385 m AV Peak Grad:      24.6 mmHg AV Mean Grad:      13.6 mmHg LVOT Vmax:         173.00 cm/s LVOT Vmean:  120.000 cm/s LVOT VTI:          0.286 m LVOT/AV VTI ratio: 0.74  AORTA Ao Root diam: 3.10 cm Ao Asc diam:  3.20 cm MITRAL VALVE               TRICUSPID VALVE MV Area (PHT): 4.37 cm    TR Peak grad:   27.2 mmHg MV Decel Time: 174 msec    TR Vmax:        261.00 cm/s MV E velocity: 84.37 cm/s MV A velocity: 90.80 cm/s  SHUNTS MV E/A ratio:  0.93        Systemic  VTI:  0.29 m                            Systemic Diam: 1.90 cm Dani Gobble Croitoru MD Electronically signed by Sanda Klein MD Signature Date/Time: 01/17/2022/12:52:13 PM    Final    DG Chest Port 1 View  Result Date: 01/17/2022 CLINICAL DATA:  56 year old male with history of aspiration pneumonia. EXAM: PORTABLE CHEST 1 VIEW COMPARISON:  Chest x-ray 01/16/2022. FINDINGS: An endotracheal tube is in place with tip 5.1 cm above the carina. There is a right-sided internal jugular central venous catheter with tip terminating in the distal superior vena cava. A nasogastric tube is seen extending into the stomach, however, the tip of the nasogastric tube extends below the lower margin of the image. Lung volumes are low. Opacity at the left base which may reflect atelectasis and/or consolidation, worsened compared to the prior study. Small left pleural effusion. Right lung is clear. No right pleural effusion. No pneumothorax. No evidence of pulmonary edema. Heart size is normal. Upper mediastinal contours are within normal limits. Atherosclerotic calcifications are noted in the thoracic aorta. IMPRESSION: 1. Support apparatus, as above. 2. Atelectasis and/or consolidation in the left lower lobe with small left pleural effusion. Electronically Signed   By: Vinnie Langton M.D.   On: 01/17/2022 07:22   US RENAL  Result Date: 01/16/2022 CLINICAL DATA:  Acute kidney injury EXAM: RENAL / URINARY TRACT ULTRASOUND COMPLETE COMPARISON:  None Available. FINDINGS: Right Kidney: Renal measurements: 12.0 x 5.7 x 5.7 cm = volume: 205 mL. Echogenicity within normal limits. No mass or hydronephrosis visualized. Left Kidney: Renal measurements: 13.9 x 6.7 x 6.6 cm = volume: 320 mL. Echogenicity within normal limits. No mass or hydronephrosis visualized. Bladder: Decompressed by Foley catheter. Other: None. IMPRESSION: 1. Normal appearance of kidneys. 2. Electronically Signed   By: Ronney Asters M.D.   On: 01/16/2022 22:56   DG  CHEST PORT 1 VIEW  Result Date: 01/16/2022 CLINICAL DATA:  Central line placement. EXAM: PORTABLE CHEST 1 VIEW COMPARISON:  Earlier radiograph dated 01/16/2022. FINDINGS: Interval placement of a right IJ central venous line with tip over central SVC. No pneumothorax. Endotracheal tube remains above the carina. Enteric tube extends below the diaphragm with tip beyond the inferior margin of the image. No interval change in bilateral pulmonary streaky densities since the earlier radiograph. Stable cardiomediastinal silhouette. No acute osseous pathology. IMPRESSION: Interval placement of a right IJ central venous line with tip over central SVC. No pneumothorax. Electronically Signed   By: Anner Crete M.D.   On: 01/16/2022 21:09   CT Head Wo Contrast  Result Date: 01/16/2022 CLINICAL DATA:  Found unresponsive concern for trauma. EXAM: CT HEAD WITHOUT CONTRAST CT CERVICAL SPINE WITHOUT CONTRAST TECHNIQUE: Multidetector CT imaging of the head and cervical spine was performed  following the standard protocol without intravenous contrast. Multiplanar CT image reconstructions of the cervical spine were also generated. RADIATION DOSE REDUCTION: This exam was performed according to the departmental dose-optimization program which includes automated exposure control, adjustment of the mA and/or kV according to patient size and/or use of iterative reconstruction technique. COMPARISON:  None Available. Cervical spine of November of 2022, CT of the head from 2011. FINDINGS: CT HEAD FINDINGS Brain: No evidence of hemorrhage, hydrocephalus, extra-axial collection or mass lesion/mass effect. 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 Signs of atrophy which has occurred since previous imaging. Vascular: No hyperdense vessel or unexpected calcification. Skull: Normal. Negative for fracture or focal lesion. Sinuses/Orbits: Scattered ethmoid opacification. Sinuses are otherwise clear. No acute  orbital process. Other: None. CT CERVICAL SPINE FINDINGS Alignment: Straightening of normal cervical lordotic curvature and head turned to the LEFT, related to patient position. Skull base and vertebrae: No acute fracture. No primary bone lesion or focal pathologic process. Soft tissues and spinal canal: No prevertebral fluid or swelling. No visible canal hematoma. Patient is intubated and the endotracheal tube is in the midtrachea. Disc levels: Mild degenerative changes throughout the cervical spine. Upper chest: Dependent airspace disease at the lung apices. Other: None IMPRESSION: 1. No acute intracranial abnormality. 2. 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 3. No acute fracture or traumatic subluxation of the cervical spine. 4. Dependent airspace disease at the lung apices, may represent volume loss or aspiration related change. Electronically Signed   By: Zetta Bills M.D.   On: 01/16/2022 17:56   CT Cervical Spine Wo Contrast  Result Date: 01/16/2022 CLINICAL DATA:  Found unresponsive concern for trauma. EXAM: CT HEAD WITHOUT CONTRAST CT CERVICAL SPINE WITHOUT CONTRAST TECHNIQUE: Multidetector CT imaging of the head and cervical spine was performed following the standard protocol without intravenous contrast. Multiplanar CT image reconstructions of the cervical spine were also generated. RADIATION DOSE REDUCTION: This exam was performed according to the departmental dose-optimization program which includes automated exposure control, adjustment of the mA and/or kV according to patient size and/or use of iterative reconstruction technique. COMPARISON:  None Available. Cervical spine of November of 2022, CT of the head from 2011. FINDINGS: CT HEAD FINDINGS Brain: No evidence of hemorrhage, hydrocephalus, extra-axial collection or mass lesion/mass effect. 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 Signs of atrophy which has occurred  since previous imaging. Vascular: No hyperdense vessel or unexpected calcification. Skull: Normal. Negative for fracture or focal lesion. Sinuses/Orbits: Scattered ethmoid opacification. Sinuses are otherwise clear. No acute orbital process. Other: None. CT CERVICAL SPINE FINDINGS Alignment: Straightening of normal cervical lordotic curvature and head turned to the LEFT, related to patient position. Skull base and vertebrae: No acute fracture. No primary bone lesion or focal pathologic process. Soft tissues and spinal canal: No prevertebral fluid or swelling. No visible canal hematoma. Patient is intubated and the endotracheal tube is in the midtrachea. Disc levels: Mild degenerative changes throughout the cervical spine. Upper chest: Dependent airspace disease at the lung apices. Other: None IMPRESSION: 1. No acute intracranial abnormality. 2. 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 3. No acute fracture or traumatic subluxation of the cervical spine. 4. Dependent airspace disease at the lung apices, may represent volume loss or aspiration related change. Electronically Signed   By: Zetta Bills M.D.   On: 01/16/2022 17:56   DG Chest Portable 1 View  Result Date: 01/16/2022 CLINICAL DATA:  Intubation.  EXAM: PORTABLE CHEST 1 VIEW COMPARISON:  Chest x-ray 04/08/2020 FINDINGS: Endotracheal tube tip is 3.4 cm above the carina. The cardiomediastinal silhouette is within normal limits. Lung volumes are low likely accentuating central pulmonary vascularity. There is no focal lung infiltrate, pleural effusion or pneumothorax. No acute fractures are seen. IMPRESSION: 1. Endotracheal tube tip is 3.4 cm above the carina. 2. Low lung volumes. Electronically Signed   By: Ronney Asters M.D.   On: 01/16/2022 17:22   Medications: Infusions:  sodium chloride Stopped (01/16/22 1909)   sodium chloride     sodium chloride     ampicillin-sulbactam (UNASYN) IV 200 mL/hr at 01/18/22 0700    ceFEPime (MAXIPIME) IV 200 mL/hr at 01/18/22 0700   feeding supplement (VITAL AF 1.2 CAL) 75 mL/hr at 01/18/22 0700   fentaNYL infusion INTRAVENOUS 400 mcg/hr (01/18/22 0700)   heparin 10,000 units/ 20 mL infusion syringe 1,600 Units/hr (01/18/22 0700)   midazolam 10 mg/hr (01/18/22 0700)   norepinephrine (LEVOPHED) Adult infusion 8 mcg/min (01/18/22 0700)   prismasol BGK 2/2.5 dialysis solution 1,500 mL/hr at 01/17/22 1814   prismasol BGK 2/2.5 replacement solution 400 mL/hr at 01/17/22 1316   sodium bicarbonate 150 mEq in sterile water 1,150 mL infusion 150 mL/hr at 01/17/22 1812   vancomycin     vasopressin 0.03 Units/min (01/18/22 0700)    Scheduled Medications:  Chlorhexidine Gluconate Cloth  6 each Topical Daily   docusate  100 mg Per Tube BID   dorzolamide-timolol  1 drop Left Eye BID   feeding supplement (PROSource TF)  45 mL Per Tube Daily   heparin  5,000 Units Subcutaneous Q8H   insulin aspart  0-20 Units Subcutaneous Q4H   insulin detemir  15 Units Subcutaneous BID   multivitamin  1 tablet Per Tube QHS   mouth rinse  15 mL Mouth Rinse Q2H   pantoprazole (PROTONIX) IV  40 mg Intravenous QHS   polyethylene glycol  17 g Per Tube Daily    have reviewed scheduled and prn medications.  Physical Exam: General: sedated on vent  Heart: tachy Lungs: CBS bilat Abdomen: slightly distended-  he reports a little pain this AM Extremities: now some edema Dialysis Access: right IJ vascath placed 7/28    01/18/2022,7:28 AM  LOS: 2 days

## 2022-01-18 NOTE — Progress Notes (Signed)
NAME:  Keith Garrett, MRN:  [id number], DOB:  1966-02-17, LOS: 2 ADMISSION DATE:  01/16/2022, CONSULTATION DATE:  7/28 REFERRING MD:  Regenia Skeeter, CHIEF COMPLAINT:  respiratory failure and severe metabolic derangements    History of Present Illness:  56 year old male patient with comorbid status listed below.  Was found by family lying facedown on the floor on 7/28.  EMS was called.  Initially he was unresponsive, then shortly after EMS arrival was agitated.  No reported history of drug use.  Speaks Spanish only.  Apparently had been feeling poorly for about 2 days prior after eating [place] food for lunch.  On 7/27 had been vomiting.  Apparently had gone to work earlier the day of admission.  On arrival to the emergency room his GCS was 5, he was not able to protect his airway, and was subsequently intubated for airway protection. ER evaluation: Initial potassium 6.5 BUN 120 creatinine 15.7 baseline 1.18.  Bicarbonate 7, sodium 135 White blood cell count 13.9 hemoglobin 8.6 initial arterial blood gas 6.83, PCO2 37 PO2 312 HCO3 6.2.  He was administered 2 L of sodium chloride, IV insulin, D50, and bicarbonate infusion was ordered.  Pulmonary was asked to admit, nephrology asked to see in consultation. Pertinent  Medical History  Hypertension, type 2 diabetes,Cataracts, lower extremity edema, CKD stage II, obesity Significant Hospital Events: Including procedures, antibiotic start and stop dates in addition to other pertinent events   7/28 admitted encephalopathic with acute on chronic renal failure, profound metabolic acidosis, hypotension, and severe metabolic derangements.  Interim History / Subjective:  Oxygenation continues to be marginal. Bps improved.  Objective   Blood pressure (!) 77/58, pulse 80, temperature 98.1 F (36.7 C), resp. rate (!) 28, height 5\' 2"  (1.575 m), weight 102.1 kg, SpO2 (!) 88 %.    Vent Mode: PRVC FiO2 (%):  [40 %-100 %] 100 % Set Rate:  [24 bmp-32 bmp] 28  bmp Vt Set:  [430 mL-510 mL] 480 mL PEEP:  [5 LFY10-17 cmH20] 20 cmH20 Plateau Pressure:  [15 cmH20-30 cmH20] 27 cmH20   Intake/Output Summary (Last 24 hours) at 01/18/2022 0815 Last data filed at 01/18/2022 0800 Gross per 24 hour  Intake 4093.83 ml  Output 5204 ml  Net -1110.17 ml    Filed Weights   01/16/22 2009 01/17/22 0500 01/18/22 0436  Weight: 96.8 kg 96.8 kg 102.1 kg    Examination: No distress Atelectasis and consolidation on bedside US of lungs Heart sounds regular, ext warm 1+ edema Moves ext with sedation holiday  ABG improved H/H stable low CXR blossomed left airspace disease  Resolved Hospital Problem list     Assessment & Plan:  Acute on chronic renal failure- on CRRT Acute metabolic encephalopathy- improved Acute hypoxemic respiratory failure due to inability to protect airway, aspiration pneumonia, volume overload Circulatory shock now mostly septic/distributive- echo okay DM2 with hyperglycemia- still an issue Iron def anemia- stable Hx cataracts  - Lung protective tidal volumes limiting driving pressures to < 15cm H2O as able - Sedation titrated to vent compliance and patient comfort using PAD orderset - VAP prevention bundle - More aggressive PEEP to help recruitment: 20 PEEP has driving pressure 14 - Push fluid removal with CRRT - Daily SAT/SBT when meets institutional criteria: too hypoxemic at present - Unasyn/doxycycline, f/u culture data, urine Ag - Levo/vaso titrated for MAP 65 - Increase basal bolus insulin - IV iron start today  Best Practice (right click and "Reselect all SmartList Selections" daily)   Diet/type: TF DVT  prophylaxis: prophylactic heparin  GI prophylaxis: PPI Lines: N/A Foley:  Yes, and it is still needed Code Status:  full code Last date of multidisciplinary goals of care discussion [pending]  38 min cc time Erskine Emery MD PCCM

## 2022-01-19 ENCOUNTER — Inpatient Hospital Stay (HOSPITAL_COMMUNITY): Payer: Self-pay

## 2022-01-19 ENCOUNTER — Encounter (HOSPITAL_COMMUNITY): Payer: Self-pay | Admitting: Internal Medicine

## 2022-01-19 LAB — POCT I-STAT 7, (LYTES, BLD GAS, ICA,H+H)
Acid-Base Excess: 1 mmol/L (ref 0.0–2.0)
Acid-Base Excess: 1 mmol/L (ref 0.0–2.0)
Acid-base deficit: 1 mmol/L (ref 0.0–2.0)
Bicarbonate: 24.2 mmol/L (ref 20.0–28.0)
Bicarbonate: 25.3 mmol/L (ref 20.0–28.0)
Bicarbonate: 25 mmol/L (ref 20.0–28.0)
Calcium, Ion: 0.96 mmol/L — ABNORMAL LOW (ref 1.15–1.40)
Calcium, Ion: 1.05 mmol/L — ABNORMAL LOW (ref 1.15–1.40)
Calcium, Ion: 1.02 mmol/L — ABNORMAL LOW (ref 1.15–1.40)
HCT: 23 % — ABNORMAL LOW (ref 39.0–52.0)
HCT: 25 % — ABNORMAL LOW (ref 39.0–52.0)
HCT: 26 % — ABNORMAL LOW (ref 39.0–52.0)
Hemoglobin: 7.8 g/dL — ABNORMAL LOW (ref 13.0–17.0)
Hemoglobin: 8.5 g/dL — ABNORMAL LOW (ref 13.0–17.0)
Hemoglobin: 8.8 g/dL — ABNORMAL LOW (ref 13.0–17.0)
O2 Saturation: 99 %
O2 Saturation: 98 %
O2 Saturation: 95 %
Patient temperature: 36.3
Patient temperature: 35.6
Patient temperature: 35.1
Potassium: 3.4 mmol/L — ABNORMAL LOW (ref 3.5–5.1)
Potassium: 3.6 mmol/L (ref 3.5–5.1)
Potassium: 3.2 mmol/L — ABNORMAL LOW (ref 3.5–5.1)
Sodium: 141 mmol/L (ref 135–145)
Sodium: 138 mmol/L (ref 135–145)
Sodium: 141 mmol/L (ref 135–145)
TCO2: 26 mmol/L (ref 22–32)
TCO2: 26 mmol/L (ref 22–32)
TCO2: 26 mmol/L (ref 22–32)
pCO2 arterial: 40.9 mmHg (ref 32–48)
pCO2 arterial: 36.7 mmHg (ref 32–48)
pCO2 arterial: 32.6 mmHg (ref 32–48)
pH, Arterial: 7.378 (ref 7.35–7.45)
pH, Arterial: 7.44 (ref 7.35–7.45)
pH, Arterial: 7.486 — ABNORMAL HIGH (ref 7.35–7.45)
pO2, Arterial: 119 mmHg — ABNORMAL HIGH (ref 83–108)
pO2, Arterial: 87 mmHg (ref 83–108)
pO2, Arterial: 62 mmHg — ABNORMAL LOW (ref 83–108)

## 2022-01-19 LAB — RENAL FUNCTION PANEL
Albumin: 2.6 g/dL — ABNORMAL LOW (ref 3.5–5.0)
Albumin: 2.6 g/dL — ABNORMAL LOW (ref 3.5–5.0)
Anion gap: 9 (ref 5–15)
Anion gap: 9 (ref 5–15)
BUN: 32 mg/dL — ABNORMAL HIGH (ref 6–20)
BUN: 32 mg/dL — ABNORMAL HIGH (ref 6–20)
CO2: 25 mmol/L (ref 22–32)
CO2: 24 mmol/L (ref 22–32)
Calcium: 7.3 mg/dL — ABNORMAL LOW (ref 8.9–10.3)
Calcium: 7.2 mg/dL — ABNORMAL LOW (ref 8.9–10.3)
Chloride: 102 mmol/L (ref 98–111)
Chloride: 103 mmol/L (ref 98–111)
Creatinine, Ser: 2.77 mg/dL — ABNORMAL HIGH (ref 0.61–1.24)
Creatinine, Ser: 2.76 mg/dL — ABNORMAL HIGH (ref 0.61–1.24)
GFR, Estimated: 26 mL/min — ABNORMAL LOW (ref >60)
GFR, Estimated: 26 mL/min — ABNORMAL LOW (ref >60)
Glucose, Bld: 219 mg/dL — ABNORMAL HIGH (ref 70–99)
Glucose, Bld: 219 mg/dL — ABNORMAL HIGH (ref 70–99)
Phosphorus: 1.5 mg/dL — ABNORMAL LOW (ref 2.5–4.6)
Phosphorus: 1.5 mg/dL — ABNORMAL LOW (ref 2.5–4.6)
Potassium: 3.9 mmol/L (ref 3.5–5.1)
Potassium: 4 mmol/L (ref 3.5–5.1)
Sodium: 136 mmol/L (ref 135–145)
Sodium: 136 mmol/L (ref 135–145)

## 2022-01-19 LAB — GLUCOSE, CAPILLARY
Glucose-Capillary: 134 mg/dL — ABNORMAL HIGH (ref 70–99)
Glucose-Capillary: 136 mg/dL — ABNORMAL HIGH (ref 70–99)
Glucose-Capillary: 159 mg/dL — ABNORMAL HIGH (ref 70–99)
Glucose-Capillary: 167 mg/dL — ABNORMAL HIGH (ref 70–99)
Glucose-Capillary: 170 mg/dL — ABNORMAL HIGH (ref 70–99)
Glucose-Capillary: 189 mg/dL — ABNORMAL HIGH (ref 70–99)
Glucose-Capillary: 238 mg/dL — ABNORMAL HIGH (ref 70–99)

## 2022-01-19 LAB — ABO/RH: ABO/RH(D): O POS

## 2022-01-19 LAB — BASIC METABOLIC PANEL
Anion gap: 8 (ref 5–15)
BUN: 27 mg/dL — ABNORMAL HIGH (ref 6–20)
CO2: 26 mmol/L (ref 22–32)
Calcium: 7.3 mg/dL — ABNORMAL LOW (ref 8.9–10.3)
Chloride: 105 mmol/L (ref 98–111)
Creatinine, Ser: 2.26 mg/dL — ABNORMAL HIGH (ref 0.61–1.24)
GFR, Estimated: 33 mL/min — ABNORMAL LOW (ref >60)
Glucose, Bld: 161 mg/dL — ABNORMAL HIGH (ref 70–99)
Potassium: 3.5 mmol/L (ref 3.5–5.1)
Sodium: 139 mmol/L (ref 135–145)

## 2022-01-19 LAB — CBC
HCT: 21.1 % — ABNORMAL LOW (ref 39.0–52.0)
Hemoglobin: 7.3 g/dL — ABNORMAL LOW (ref 13.0–17.0)
MCH: 31.2 pg (ref 26.0–34.0)
MCHC: 34.6 g/dL (ref 30.0–36.0)
MCV: 90.2 fL (ref 80.0–100.0)
Platelets: 176 10*3/uL (ref 150–400)
RBC: 2.34 MIL/uL — ABNORMAL LOW (ref 4.22–5.81)
RDW: 13.6 % (ref 11.5–15.5)
WBC: 13.7 10*3/uL — ABNORMAL HIGH (ref 4.0–10.5)
nRBC: 0.3 % — ABNORMAL HIGH (ref 0.0–0.2)

## 2022-01-19 LAB — POCT ACTIVATED CLOTTING TIME
Activated Clotting Time: 197 seconds
Activated Clotting Time: 173 seconds
Activated Clotting Time: 191 seconds
Activated Clotting Time: 179 seconds
Activated Clotting Time: 173 seconds
Activated Clotting Time: 191 seconds

## 2022-01-19 LAB — APTT: aPTT: 143 seconds — ABNORMAL HIGH (ref 24–36)

## 2022-01-19 LAB — PREPARE RBC (CROSSMATCH)

## 2022-01-19 MED ORDER — SODIUM CHLORIDE 0.9% IV SOLUTION: Freq: Once | INTRAVENOUS | Status: AC

## 2022-01-19 MED ORDER — CALCIUM GLUCONATE-NACL 1-0.675 GM/50ML-% IV SOLN
1.0000 g | Freq: Once | INTRAVENOUS | Status: AC
  Administered 2022-01-19: 1000 mg via INTRAVENOUS
  Filled 2022-01-19: qty 50

## 2022-01-19 MED ORDER — SODIUM CHLORIDE 0.9 % IV SOLN: INTRAVENOUS | Status: DC | PRN

## 2022-01-19 MED ORDER — SODIUM PHOSPHATES 45 MMOLE/15ML IV SOLN
30.0000 mmol | Freq: Once | INTRAVENOUS | Status: AC
  Administered 2022-01-19: 30 mmol via INTRAVENOUS
  Filled 2022-01-19: qty 10

## 2022-01-19 MED ORDER — INSULIN DETEMIR 100 UNIT/ML ~~LOC~~ SOLN
24.0000 [IU] | Freq: Two times a day (BID) | SUBCUTANEOUS | Status: DC
Start: 2022-01-19 — End: 2022-01-20
  Administered 2022-01-19 (×2): 24 [IU] via SUBCUTANEOUS
  Filled 2022-01-19 (×4): qty 0.24

## 2022-01-19 MED ORDER — PROPOFOL 1000 MG/100ML IV EMUL
5.0000 ug/kg/min | INTRAVENOUS | Status: DC
  Administered 2022-01-19: 5 ug/kg/min via INTRAVENOUS
  Administered 2022-01-19 (×2): 40 ug/kg/min via INTRAVENOUS
  Filled 2022-01-19: qty 100
  Filled 2022-01-19: qty 200

## 2022-01-19 NOTE — Progress Notes (Addendum)
Schedule  NAME:  Keith Garrett, MRN:  [id number], DOB:  December 27, 1965, LOS: 3 ADMISSION DATE:  01/16/2022, CONSULTATION DATE:  7/28 REFERRING MD:  Regenia Skeeter, CHIEF COMPLAINT:  respiratory failure and severe metabolic derangements    History of Present Illness:  56 year old male patient with comorbid status listed below.  Was found by family lying facedown on the floor on 7/28.  EMS was called.  Initially he was unresponsive, then shortly after EMS arrival was agitated.  No reported history of drug use.  Speaks Spanish only.  Apparently had been feeling poorly for about 2 days prior after eating [place] food for lunch.  On 7/27 had been vomiting.  Apparently had gone to work earlier the day of admission.  On arrival to the emergency room his GCS was 5, he was not able to protect his airway, and was subsequently intubated for airway protection. ER evaluation: Initial potassium 6.5 BUN 120 creatinine 15.7 baseline 1.18.  Bicarbonate 7, sodium 135 White blood cell count 13.9 hemoglobin 8.6 initial arterial blood gas 6.83, PCO2 37 PO2 312 HCO3 6.2.  He was administered 2 L of sodium chloride, IV insulin, D50, and bicarbonate infusion was ordered.  Pulmonary was asked to admit, nephrology asked to see in consultation. Pertinent  Medical History  Hypertension, type 2 diabetes,Cataracts, lower extremity edema, CKD stage II, obesity Significant Hospital Events: Including procedures, antibiotic start and stop dates in addition to other pertinent events   7/28 admitted encephalopathic with acute on chronic renal failure, profound metabolic acidosis, hypotension, and severe metabolic derangements.  Interim History / Subjective:  Vasopressor requirement is coming down FiO2 was titrated down to 50%  Objective   Blood pressure 104/66, pulse 60, temperature (!) 96.6 F (35.9 C), resp. rate (!) 28, height 5\' 2"  (1.575 m), weight 87 kg, SpO2 99 %.    Vent Mode: PRVC FiO2 (%):  [50 %-90 %] 50 % Set Rate:  [28  bmp] 28 bmp Vt Set:  [480 mL-4810 mL] 480 mL PEEP:  [14 cmH20-20 cmH20] 14 cmH20 Plateau Pressure:  [27 cmH20-35 cmH20] 27 cmH20   Intake/Output Summary (Last 24 hours) at 01/19/2022 1005 Last data filed at 01/19/2022 0900 Gross per 24 hour  Intake 4144.83 ml  Output 7653 ml  Net -3508.17 ml   Filed Weights   01/17/22 0500 01/18/22 0436 01/19/22 0500  Weight: 96.8 kg 102.1 kg 87 kg    Examination:   Physical exam: General: Crtitically ill-appearing male, orally intubated HEENT: [place]/AT, eyes anicteric.  ETT and OGT in place Neuro: Sedated, not following commands.  Eyes are closed.  Pupils 3 mm bilateral reactive to light Chest: Coarse breath sounds, no wheezes or rhonchi Heart: Regular rate and rhythm, no murmurs or gallops Abdomen: Soft, nontender, nondistended, bowel sounds present Skin: No rash  Hb 7.3 ABGs improved  Resolved Hospital Problem list     Assessment & Plan:  Acute kidney injury due to ischemic ATN on CRRT Acute metabolic encephalopathy in the setting of uremia and metabolic acidosis-improving Acute hypoxemic/hypercapnic respiratory failure due to inability to protect airway, aspiration pneumonia, volume overload Circulatory shock now mostly septic/distributive Severe metabolic acidosis, improving DM2 with hyperglycemia- still an issue Iron def anemia- stable Hyponatremia/hypokalemia, improved  Appreciate nephrology follow-up Patient is on CRRT Stop bicarbonate infusion as metabolic acidosis has improved Minimize sedation with RASS goal 0/-1 Hypercapnia has cleared, continue lung protective ventilation Plateau and driving pressures at goal FiO2 was titrated down to 50% and PEEP to 12 Continue titrate vasopressors, off Levophed currently  on vasopressin Continue IV antibiotics Continue insulin infusion as blood sugars are still labile We will transfuse 1 unit PRBC Monitor H&H Closely monitor electrolytes and supplement   Best Practice (right click  and "Reselect all SmartList Selections" daily)   Diet/type: TF DVT prophylaxis: prophylactic heparin  GI prophylaxis: PPI Lines: N/A Foley:  Yes, and it is still needed Code Status:  full code Last date of multidisciplinary goals of care discussion [pending]   Total critical care time: 48 minutes  Performed by: [place] care time was exclusive of separately billable procedures and treating other patients.   Critical care was necessary to treat or prevent imminent or life-threatening deterioration.   Critical care was time spent personally by me on the following activities: development of treatment plan with patient and/or surrogate as well as nursing, discussions with consultants, evaluation of patient's response to treatment, examination of patient, obtaining history from patient or surrogate, ordering and performing treatments and interventions, ordering and review of laboratory studies, ordering and review of radiographic studies, pulse oximetry and re-evaluation of patient's condition.   Jacky Kindle, MD [facility] See Amion for pager If no response to pager, please call [phone] until 7pm After 7pm, Please call E-link [phone]

## 2022-01-19 NOTE — Progress Notes (Signed)
Assessment/Plan: 56 year old hispanic male with DM, HTN now presenting with AKI and profound acidosis with hyperkalemia 1.Renal- last known crt was normal in 2021.  Difficult to know if this is acute, subacute  or chronic in the setting of longstanding DM and HTN. No urine available for review initially but will send today-   renal ultrasound  normal.  Given his hypotension and lactate of 9 major issue is hemodynamic at this point/ATN.   using CRRT to help correct acidosis and k-  started late 7/28.  Running well this AM .   Now making good urine but will keep on CRRT for now given critical illness. If he clots off may stop and we will evaluate daily. No need to check ACT (keep heparin at [phone]); no issues with filter clotting.   Seen on CRRT All 4K baths 400/400/1500 pre/post/qd Tolerating UF (100-137ml/hr)  2. Hypertension/volume  - hypotensive and dry initially, was taking ACE-I as a home med-  aggressive volume resuscitation and pressors-  1.57 liters positive and doesn't appear to be that overloaded on exam. 3.  Leukocytosis-  unclear if septic type picture-  now looking like PNA-  abx broadened to cefepime and vanc 4. Metabolic acidosis-  due to AKI and lactate-  bicarb amps and bicarb based fluids-  now CRRT -  correcting nicely -  change bags to all 4 K  5. Hyperkalemia -  due to AKI and acidosis -  given short term treatments and also lokelma -  is better 6. Anemia  - is pretty severe and with patient being dry as well.  Points to the fact that this could be CKD-  supportive care for now -  conflicting data-   reported from 7.8-  transfuse as needed     Zamani Crocker W    Subjective:   UOP has picked up quite a bit over past 48hrs but only 14ml/hr this am for a few hours. BP very labile.  Concern he's developing ARDS; FIO2 and PEEP decr this AM-   started to pull with CRRT on 7/30 but still positive 1543mL during hospitalization. -  Still on Levo but off Vaso.   Objective Vital  signs in last 24 hours: Vitals:   01/19/22 0700 01/19/22 0715 01/19/22 0734 01/19/22 0800  BP: 104/66     Pulse: 62 60    Resp: (!) 28 (!) 28    Temp: (!) 96.8 F (36 C) (!) 96.6 F (35.9 C)    TempSrc:    Bladder  SpO2: 100% 100% 99%   Weight:      Height:       Weight change: -15.1 kg  Intake/Output Summary (Last 24 hours) at 01/19/2022 1008 Last data filed at 01/19/2022 0900 Gross per 24 hour  Intake 4144.83 ml  Output 7653 ml  Net -3508.17 ml     Labs: Basic Metabolic Panel: Recent Labs  Lab 01/18/22 0513 01/18/22 0538 01/18/22 1552 01/18/22 2143 01/18/22 2151 01/19/22 0444 01/19/22 0608  NA 133*   < > 135   < > 137 136 141  K 4.2   < > 4.0   < > 4.0 3.9 3.4*  CL 95*   < > 102  --  102 102  --   CO2 23   < > 25  --  26 25  --   GLUCOSE 267*   < > 212*  --  199* 219*  --   BUN 47*   < > 37*  --  34* 32*  --   CREATININE 4.93*   < > 3.54*  --  3.13* 2.77*  --   CALCIUM 6.6*   < > 6.9*  --  7.2* 7.3*  --   PHOS 4.3  3.9  --  2.9  --   --  1.5*  --    < > = values in this interval not displayed.   Liver Function Tests: Recent Labs  Lab 01/16/22 1713 01/17/22 1511 01/18/22 0513 01/18/22 1552 01/19/22 0444  AST 20  --   --   --   --   ALT 12  --   --   --   --   ALKPHOS 78  --   --   --   --   BILITOT 1.3*  --   --   --   --   PROT 6.4*  --   --   --   --   ALBUMIN 3.1*   < > 2.7* 2.6* 2.6*   < > = values in this interval not displayed.   No results for input(s): "LIPASE", "AMYLASE" in the last 168 hours. No results for input(s): "AMMONIA" in the last 168 hours. CBC: Recent Labs  Lab 01/16/22 1713 01/16/22 1742 01/17/22 0345 01/17/22 0541 01/18/22 0513 01/18/22 0538 01/18/22 2143 01/19/22 0444 01/19/22 0608  WBC 13.9*  --  25.7*  --  13.7*  --   --  13.7*  --   NEUTROABS 11.3*  --   --   --   --   --   --   --   --   HGB 8.6*   < > 7.9*   < > 7.8*   < > 7.8* 7.3* 7.8*  HCT 27.6*   < > 24.9*   < > 22.8*   < > 23.0* 21.1* 23.0*  MCV 98.2  --   97.6  --  89.8  --   --  90.2  --   PLT 265  --  197  --  159  --   --  176  --    < > = values in this interval not displayed.   Cardiac Enzymes: Recent Labs  Lab 01/16/22 1713  CKTOTAL 129   CBG: Recent Labs  Lab 01/18/22 1546 01/18/22 1958 01/19/22 0028 01/19/22 0452 01/19/22 0748  GLUCAP 205* 182* 238* 189* 170*    Iron Studies:  Recent Labs    01/18/22 0513  IRON 20*  TIBC 193*  FERRITIN 200   Studies/Results: DG Chest Port 1 View  Result Date: 01/19/2022 CLINICAL DATA:  Endotracheal tube, respiratory failure.  CHF EXAM: PORTABLE CHEST 1 VIEW COMPARISON:  Radiograph January 18, 2022. FINDINGS: Endotracheal tube with tip projecting over the proximal thoracic trachea. Nasogastric tube courses below the diaphragm with tip obscured by collimation. Right IJ CVC with tip projecting over the SVC. Additional EKG leads project over the chest. Improved aeration of the lungs with decreased left-greater-than-right basilar predominant interstitial opacities and patchy airspace opacities. The heart size and mediastinal contours are unchanged. No acute osseous abnormality. IMPRESSION: Improved aeration of the lungs with decreased left-greater-than-right basilar predominant interstitial opacities and patchy airspace opacities. Electronically Signed   By: Dahlia Bailiff M.D.   On: 01/19/2022 08:10   DG Chest Port 1 View  Result Date: 01/18/2022 CLINICAL DATA:  Metabolic encephalopathy with ventilator dependent respiratory failure. EXAM: PORTABLE CHEST 1 VIEW COMPARISON:  The earlier study today at 12:32 a.m. FINDINGS: 5:16 a.m. ETT tip is 4.3  cm from the carina. NGT passes well into the stomach but neither the side-hole or tip are included in the film. Right IJ double-lumen catheter terminates in the distal SVC as before. There is mild cardiomegaly increased central vascular prominence, mild increased central edema. There is worsening patchy dense consolidation in the left mid and lower lung  field, increased small left-greater-than-right pleural effusions and unchanged appearance of right lung mid perihilar consolidation. Findings are concerning for worsening pneumonia on the left with stable airspace disease on the right. The upper lung fields are generally clear. In all other respects no further changes. IMPRESSION: Increasing airspace disease and pleural effusion on the left. No interval change in right mid perihilar consolidation. Increasing central vascular prominence and mild central edema. Electronically Signed   By: Telford Nab M.D.   On: 01/18/2022 07:26   DG CHEST PORT 1 VIEW  Result Date: 01/18/2022 CLINICAL DATA:  Shortness of breath. EXAM: PORTABLE CHEST 1 VIEW COMPARISON:  Earlier radiograph dated 01/17/2022. FINDINGS: Endotracheal tube remains above the carina. Right IJ central venous line in similar position. Enteric tube extends below the diaphragm with tip beyond the inferior margin of the image. Shallow inspiration with bibasilar atelectasis versus infiltrate, left greater right. Overall interval improvement of the aeration of the lungs compared to prior radiograph. No pneumothorax. Possible trace pleural effusions. Stable cardiomediastinal silhouette. No acute osseous pathology. IMPRESSION: Shallow inspiration with bibasilar atelectasis versus infiltrate, left greater right. Overall interval improvement of the aeration of the lungs compared to prior radiograph. Electronically Signed   By: Anner Crete M.D.   On: 01/18/2022 00:43   DG CHEST PORT 1 VIEW  Result Date: 01/17/2022 CLINICAL DATA:  Atelectasis. EXAM: PORTABLE CHEST 1 VIEW COMPARISON:  January 17, 2022 FINDINGS: The ETT is in good position. The NG tube terminates below today's film. The right central line is stable. No pneumothorax. The right lung is clear. Haziness over the left mid lower chest is increased. No other acute abnormalities. IMPRESSION: 1. Support apparatus as above. 2. Increased haziness in the  left mid lower lung is favored to represent layering effusion with underlying atelectasis. Developing infiltrate is possible as well. Recommend clinical correlation and attention on short-term follow-up. Electronically Signed   By: Dorise Bullion III M.D.   On: 01/17/2022 16:49   ECHOCARDIOGRAM COMPLETE  Result Date: 01/17/2022    ECHOCARDIOGRAM REPORT   Patient Name:   Keith Garrett Date of Exam: 01/17/2022 Medical Rec #:  [id number]        Height:       62.0 in Accession #:    [id number]       Weight:       213.4 lb Date of Birth:  17-Feb-1966        BSA:          1.965 m Patient Age:    70 years         BP:           129/62 mmHg Patient Gender: M                HR:           99 bpm. Exam Location:  Inpatient Procedure: 2D Echo, Cardiac Doppler, Color Doppler and Intracardiac            Opacification Agent Indications:    I42.9 Cardiomyopathy (unspecified)  History:        Patient has prior history of Echocardiogram examinations, most  recent 04/16/2020. TIA; Signs/Symptoms:Dyspnea.  Sonographer:    Roseanna Rainbow RDCS Referring Phys: [id number] Candee Furbish  Sonographer Comments: Technically difficult study due to poor echo windows, patient is morbidly obese and echo performed with patient supine and on artificial respirator. Image acquisition challenging due to patient body habitus. IMPRESSIONS  1. Left ventricular ejection fraction, by estimation, is 65 to 70%. The left ventricle has hyperdynamic function. The left ventricle has no regional wall motion abnormalities. Left ventricular diastolic parameters were normal.  2. Right ventricular systolic function is hyperdynamic. The right ventricular size is normal. There is mildly elevated pulmonary artery systolic pressure. The estimated right ventricular systolic pressure is 38.4 mmHg.  3. Left atrial size was moderately dilated.  4. The mitral valve is normal in structure. No evidence of mitral valve regurgitation.  5. The aortic valve is tricuspid.  There is mild calcification of the aortic valve. There is mild thickening of the aortic valve. Aortic valve regurgitation is not visualized. Aortic valve sclerosis/calcification is present, without any evidence of aortic stenosis.  6. The inferior vena cava is dilated in size with <50% respiratory variability, suggesting right atrial pressure of 15 mmHg. Comparison(s): No significant change from prior study. Prior images reviewed side by side. FINDINGS  Left Ventricle: Left ventricular ejection fraction, by estimation, is 65 to 70%. The left ventricle has hyperdynamic function. The left ventricle has no regional wall motion abnormalities. Definity contrast agent was given IV to delineate the left ventricular endocardial borders. The left ventricular internal cavity size was normal in size. There is borderline concentric left ventricular hypertrophy. Left ventricular diastolic parameters were normal. Normal left ventricular filling pressure. Right Ventricle: The right ventricular size is normal. No increase in right ventricular wall thickness. Right ventricular systolic function is hyperdynamic. There is mildly elevated pulmonary artery systolic pressure. The tricuspid regurgitant velocity is 2.61 m/s, and with an assumed right atrial pressure of 15 mmHg, the estimated right ventricular systolic pressure is 66.5 mmHg. Left Atrium: Left atrial size was moderately dilated. Right Atrium: Right atrial size was normal in size. Pericardium: There is no evidence of pericardial effusion. Mitral Valve: The mitral valve is normal in structure. Mild to moderate mitral annular calcification. No evidence of mitral valve regurgitation. Tricuspid Valve: The tricuspid valve is normal in structure. Tricuspid valve regurgitation is trivial. Aortic Valve: The aortic valve is tricuspid. There is mild calcification of the aortic valve. There is mild thickening of the aortic valve. Aortic valve regurgitation is not visualized. Aortic  valve sclerosis/calcification is present, without any evidence of aortic stenosis. Aortic valve mean gradient measures 13.6 mmHg. Aortic valve peak gradient measures 24.6 mmHg. Aortic valve area, by VTI measures 2.10 cm. Pulmonic Valve: The pulmonic valve was grossly normal. Pulmonic valve regurgitation is not visualized. Aorta: The aortic root and ascending aorta are structurally normal, with no evidence of dilitation. Venous: The inferior vena cava is dilated in size with less than 50% respiratory variability, suggesting right atrial pressure of 15 mmHg. IAS/Shunts: No atrial level shunt detected by color flow Doppler.  LEFT VENTRICLE PLAX 2D LVIDd:         5.00 cm      Diastology LVIDs:         2.80 cm      LV e' medial:    11.60 cm/s LV PW:         1.20 cm      LV E/e' medial:  7.3 LV IVS:  1.20 cm      LV e' lateral:   9.14 cm/s LVOT diam:     1.90 cm      LV E/e' lateral: 9.2 LV SV:         81 LV SV Index:   41 LVOT Area:     2.84 cm  LV Volumes (MOD) LV vol d, MOD A2C: 94.7 ml LV vol d, MOD A4C: 118.0 ml LV vol s, MOD A2C: 33.7 ml LV vol s, MOD A4C: 41.1 ml LV SV MOD A2C:     61.0 ml LV SV MOD A4C:     118.0 ml LV SV MOD BP:      71.0 ml RIGHT VENTRICLE             IVC RV S prime:     22.10 cm/s  IVC diam: 2.80 cm TAPSE (M-mode): 2.4 cm LEFT ATRIUM             Index        RIGHT ATRIUM           Index LA diam:        4.70 cm 2.39 cm/m   RA Area:     11.20 cm LA Vol (A2C):   34.0 ml 17.30 ml/m  RA Volume:   19.80 ml  10.08 ml/m LA Vol (A4C):   45.3 ml 23.05 ml/m LA Biplane Vol: 42.2 ml 21.47 ml/m  AORTIC VALVE AV Area (Vmax):    1.98 cm AV Area (Vmean):   2.00 cm AV Area (VTI):     2.10 cm AV Vmax:           248.00 cm/s AV Vmean:          170.400 cm/s AV VTI:            0.385 m AV Peak Grad:      24.6 mmHg AV Mean Grad:      13.6 mmHg LVOT Vmax:         173.00 cm/s LVOT Vmean:        120.000 cm/s LVOT VTI:          0.286 m LVOT/AV VTI ratio: 0.74  AORTA Ao Root diam: 3.10 cm Ao Asc diam:  3.20  cm MITRAL VALVE               TRICUSPID VALVE MV Area (PHT): 4.37 cm    TR Peak grad:   27.2 mmHg MV Decel Time: 174 msec    TR Vmax:        261.00 cm/s MV E velocity: 84.37 cm/s MV A velocity: 90.80 cm/s  SHUNTS MV E/A ratio:  0.93        Systemic VTI:  0.29 m                            Systemic Diam: 1.90 cm Dani Gobble Croitoru MD Electronically signed by Sanda Klein MD Signature Date/Time: 01/17/2022/12:52:13 PM    Final    Medications: Infusions:   prismasol BGK 4/2.5 400 mL/hr at 01/19/22 0924    prismasol BGK 4/2.5 400 mL/hr at 01/19/22 0923   sodium chloride Stopped (01/16/22 1909)   sodium chloride     sodium chloride     ampicillin-sulbactam (UNASYN) IV Stopped (01/19/22 0545)   feeding supplement (VITAL AF 1.2 CAL) 75 mL/hr at 01/19/22 0900   fentaNYL infusion INTRAVENOUS 350 mcg/hr (01/19/22 0929)   ferric gluconate (FERRLECIT) IVPB Stopped (01/18/22  1115)   heparin 10,000 units/ 20 mL infusion syringe 1,050 Units/hr (01/19/22 0657)   norepinephrine (LEVOPHED) Adult infusion 5 mcg/min (01/19/22 0900)   prismasol BGK 4/2.5 1,500 mL/hr at 01/19/22 0629   propofol (DIPRIVAN) infusion 5 mcg/kg/min (01/19/22 0900)   sodium phosphate 30 mmol in dextrose 5 % 250 mL infusion      Scheduled Medications:  sodium chloride   Intravenous Once   Chlorhexidine Gluconate Cloth  6 each Topical Daily   docusate  100 mg Per Tube BID   dorzolamide-timolol  1 drop Left Eye BID   doxycycline  100 mg Per Tube Q12H   feeding supplement (PROSource TF)  45 mL Per Tube Daily   insulin aspart  0-20 Units Subcutaneous Q4H   insulin aspart  4 Units Subcutaneous Q4H   insulin detemir  24 Units Subcutaneous BID   multivitamin  1 tablet Per Tube QHS   mouth rinse  15 mL Mouth Rinse Q2H   pantoprazole (PROTONIX) IV  40 mg Intravenous QHS   polyethylene glycol  17 g Per Tube Daily    have reviewed scheduled and prn medications.  Physical Exam: General: sedated on vent  Heart: tachy Lungs: CBS  bilat Abdomen: slightly distended Extremities: tr edema Dialysis Access: right IJ vascath placed 7/28    01/19/2022,10:08 AM  LOS: 3 days

## 2022-01-19 NOTE — Plan of Care (Signed)
  Problem: Fluid Volume: Goal: Ability to maintain a balanced intake and output will improve Outcome: Progressing   Problem: Metabolic: Goal: Ability to maintain appropriate glucose levels will improve Outcome: Progressing   Problem: Nutritional: Goal: Maintenance of adequate nutrition will improve Outcome: Progressing Goal: Progress toward achieving an optimal weight will improve Outcome: Progressing   Problem: Skin Integrity: Goal: Risk for impaired skin integrity will decrease Outcome: Progressing   Problem: Tissue Perfusion: Goal: Adequacy of tissue perfusion will improve Outcome: Progressing   Problem: Clinical Measurements: Goal: Diagnostic test results will improve Outcome: Progressing Goal: Respiratory complications will improve Outcome: Progressing Goal: Cardiovascular complication will be avoided Outcome: Progressing   Problem: Nutrition: Goal: Adequate nutrition will be maintained Outcome: Progressing   Problem: Elimination: Goal: Will not experience complications related to bowel motility Outcome: Progressing Goal: Will not experience complications related to urinary retention Outcome: Progressing   Problem: Pain Managment: Goal: General experience of comfort will improve Outcome: Progressing   Problem: Safety: Goal: Ability to remain free from injury will improve Outcome: Progressing   Problem: Skin Integrity: Goal: Risk for impaired skin integrity will decrease Outcome: Progressing

## 2022-01-20 DIAGNOSIS — G9340 Encephalopathy, unspecified: Secondary | ICD-10-CM

## 2022-01-20 LAB — RENAL FUNCTION PANEL
Albumin: 2.6 g/dL — ABNORMAL LOW (ref 3.5–5.0)
Albumin: 2.6 g/dL — ABNORMAL LOW (ref 3.5–5.0)
Anion gap: 8 (ref 5–15)
Anion gap: 15 (ref 5–15)
BUN: 24 mg/dL — ABNORMAL HIGH (ref 6–20)
BUN: 33 mg/dL — ABNORMAL HIGH (ref 6–20)
CO2: 26 mmol/L (ref 22–32)
CO2: 25 mmol/L (ref 22–32)
Calcium: 7.7 mg/dL — ABNORMAL LOW (ref 8.9–10.3)
Calcium: 7.9 mg/dL — ABNORMAL LOW (ref 8.9–10.3)
Chloride: 104 mmol/L (ref 98–111)
Chloride: 100 mmol/L (ref 98–111)
Creatinine, Ser: 1.93 mg/dL — ABNORMAL HIGH (ref 0.61–1.24)
Creatinine, Ser: 2.76 mg/dL — ABNORMAL HIGH (ref 0.61–1.24)
GFR, Estimated: 40 mL/min — ABNORMAL LOW (ref >60)
GFR, Estimated: 26 mL/min — ABNORMAL LOW (ref >60)
Glucose, Bld: 223 mg/dL — ABNORMAL HIGH (ref 70–99)
Glucose, Bld: 89 mg/dL (ref 70–99)
Phosphorus: 2 mg/dL — ABNORMAL LOW (ref 2.5–4.6)
Phosphorus: 3.8 mg/dL (ref 2.5–4.6)
Potassium: 3.9 mmol/L (ref 3.5–5.1)
Potassium: 3.8 mmol/L (ref 3.5–5.1)
Sodium: 138 mmol/L (ref 135–145)
Sodium: 140 mmol/L (ref 135–145)

## 2022-01-20 LAB — BPAM RBC
Blood Product Expiration Date: 202308062359
ISSUE DATE / TIME: 202307311055
Unit Type and Rh: 5100

## 2022-01-20 LAB — POCT I-STAT 7, (LYTES, BLD GAS, ICA,H+H)
Acid-Base Excess: 2 mmol/L (ref 0.0–2.0)
Acid-Base Excess: 2 mmol/L (ref 0.0–2.0)
Bicarbonate: 25.9 mmol/L (ref 20.0–28.0)
Bicarbonate: 26.6 mmol/L (ref 20.0–28.0)
Calcium, Ion: 1.03 mmol/L — ABNORMAL LOW (ref 1.15–1.40)
Calcium, Ion: 1.04 mmol/L — ABNORMAL LOW (ref 1.15–1.40)
HCT: 25 % — ABNORMAL LOW (ref 39.0–52.0)
HCT: 25 % — ABNORMAL LOW (ref 39.0–52.0)
Hemoglobin: 8.5 g/dL — ABNORMAL LOW (ref 13.0–17.0)
Hemoglobin: 8.5 g/dL — ABNORMAL LOW (ref 13.0–17.0)
O2 Saturation: 97 %
O2 Saturation: 98 %
Patient temperature: 36.9
Patient temperature: 37.1
Potassium: 3.8 mmol/L (ref 3.5–5.1)
Potassium: 3.9 mmol/L (ref 3.5–5.1)
Sodium: 139 mmol/L (ref 135–145)
Sodium: 139 mmol/L (ref 135–145)
TCO2: 27 mmol/L (ref 22–32)
TCO2: 28 mmol/L (ref 22–32)
pCO2 arterial: 38.7 mmHg (ref 32–48)
pCO2 arterial: 40.1 mmHg (ref 32–48)
pH, Arterial: 7.428 (ref 7.35–7.45)
pH, Arterial: 7.434 (ref 7.35–7.45)
pO2, Arterial: 101 mmHg (ref 83–108)
pO2, Arterial: 89 mmHg (ref 83–108)

## 2022-01-20 LAB — GLUCOSE, CAPILLARY
Glucose-Capillary: 121 mg/dL — ABNORMAL HIGH (ref 70–99)
Glucose-Capillary: 156 mg/dL — ABNORMAL HIGH (ref 70–99)
Glucose-Capillary: 176 mg/dL — ABNORMAL HIGH (ref 70–99)
Glucose-Capillary: 182 mg/dL — ABNORMAL HIGH (ref 70–99)
Glucose-Capillary: 197 mg/dL — ABNORMAL HIGH (ref 70–99)
Glucose-Capillary: 82 mg/dL (ref 70–99)

## 2022-01-20 LAB — TYPE AND SCREEN
ABO/RH(D): O POS
Antibody Screen: NEGATIVE
Unit division: 0

## 2022-01-20 LAB — CBC
HCT: 25.9 % — ABNORMAL LOW (ref 39.0–52.0)
Hemoglobin: 9 g/dL — ABNORMAL LOW (ref 13.0–17.0)
MCH: 30.8 pg (ref 26.0–34.0)
MCHC: 34.7 g/dL (ref 30.0–36.0)
MCV: 88.7 fL (ref 80.0–100.0)
Platelets: 175 10*3/uL (ref 150–400)
RBC: 2.92 MIL/uL — ABNORMAL LOW (ref 4.22–5.81)
RDW: 15.3 % (ref 11.5–15.5)
WBC: 16.5 10*3/uL — ABNORMAL HIGH (ref 4.0–10.5)
nRBC: 0.8 % — ABNORMAL HIGH (ref 0.0–0.2)

## 2022-01-20 LAB — APTT: aPTT: 98 seconds — ABNORMAL HIGH (ref 24–36)

## 2022-01-20 LAB — VITAMIN D 25 HYDROXY (VIT D DEFICIENCY, FRACTURES): Vit D, 25-Hydroxy: 20.37 ng/mL — ABNORMAL LOW (ref 30–100)

## 2022-01-20 LAB — TRIGLYCERIDES: Triglycerides: 102 mg/dL (ref <150)

## 2022-01-20 MED ORDER — DEXMEDETOMIDINE HCL IN NACL 400 MCG/100ML IV SOLN
0.4000 ug/kg/h | INTRAVENOUS | Status: DC
  Administered 2022-01-20: 0.4 ug/kg/h via INTRAVENOUS
  Administered 2022-01-21: 0.7 ug/kg/h via INTRAVENOUS
  Filled 2022-01-20 (×2): qty 100

## 2022-01-20 MED ORDER — CALCIUM GLUCONATE-NACL 1-0.675 GM/50ML-% IV SOLN
1.0000 g | Freq: Once | INTRAVENOUS | Status: AC
  Administered 2022-01-20: 1000 mg via INTRAVENOUS
  Filled 2022-01-20: qty 50

## 2022-01-20 MED ORDER — SODIUM CHLORIDE 0.9 % IV SOLN
3.0000 g | Freq: Two times a day (BID) | INTRAVENOUS | Status: AC
  Administered 2022-01-21 – 2022-01-23 (×5): 3 g via INTRAVENOUS
  Filled 2022-01-20 (×5): qty 8

## 2022-01-20 MED ORDER — ENOXAPARIN SODIUM 40 MG/0.4ML IJ SOSY
40.0000 mg | PREFILLED_SYRINGE | INTRAMUSCULAR | Status: DC
  Administered 2022-01-20 – 2022-01-21 (×2): 40 mg via SUBCUTANEOUS
  Filled 2022-01-20 (×2): qty 0.4

## 2022-01-20 MED ORDER — SODIUM PHOSPHATES 45 MMOLE/15ML IV SOLN
15.0000 mmol | Freq: Once | INTRAVENOUS | Status: AC
Start: 2022-01-20 — End: 2022-01-20
  Administered 2022-01-20: 15 mmol via INTRAVENOUS
  Filled 2022-01-20: qty 5

## 2022-01-20 MED ORDER — INSULIN DETEMIR 100 UNIT/ML ~~LOC~~ SOLN
28.0000 [IU] | Freq: Two times a day (BID) | SUBCUTANEOUS | Status: DC
  Administered 2022-01-20 – 2022-01-21 (×3): 28 [IU] via SUBCUTANEOUS
  Filled 2022-01-20 (×4): qty 0.28

## 2022-01-20 MED ORDER — MIDODRINE HCL 5 MG PO TABS
10.0000 mg | ORAL_TABLET | Freq: Three times a day (TID) | ORAL | Status: DC
Start: 2022-01-20 — End: 2022-01-21
  Administered 2022-01-20 (×2): 10 mg
  Filled 2022-01-20 (×3): qty 2

## 2022-01-20 NOTE — Progress Notes (Signed)
PHARMACY NOTE:  ANTIMICROBIAL RENAL DOSAGE ADJUSTMENT  Current antimicrobial regimen includes a mismatch between antimicrobial dosage and estimated renal function.  As per policy approved by the Pharmacy & Therapeutics and Medical Executive Committees, the antimicrobial dosage will be adjusted accordingly.  Current antimicrobial dosage:  Unasyn 3g IV q8h  Indication: PNA  Renal Function:  Estimated Creatinine Clearance: 28.8 mL/min (A) (by C-G formula based on SCr of 2.76 mg/dL (H)). []      On intermittent HD, scheduled: []      On CRRT    Antimicrobial dosage has been changed to:  3g q12h  Additional comments:   Thank you for allowing pharmacy to be a part of this patient's care.  Einar Grad, [facility] 01/20/2022 7:01 PM

## 2022-01-20 NOTE — Progress Notes (Signed)
eLink Physician-Brief Progress Note Patient Name: Keith Garrett DOB: January 13, 1966 MRN: [id number]   Date of Service  01/20/2022  HPI/Events of Note  Patient with sub-optimal sedation on the ventilator.  eICU Interventions  Precedex gtt ordered.        [name] 01/20/2022, 11:02 PM

## 2022-01-20 NOTE — Progress Notes (Addendum)
Night Shift Summary Note:  CRRT filter completed its 72 hours cycle, so d/c'd per MD.  Pt RASS -2, wakes to voice and follows commands given in Spanish in all extremities. Off all sedation since 9pm last night.  Weaned Levo gtt to 2.5 mcg/min. CVP 11 when CRRT ended.  Vent settings 50%/8 PEEP/480 VT/24 rate. ABG WDL.   UOP ~1L overnight. Bed weight up 2.5kg from yesterday, but pt is net negative ~1.7L for shift. Unclear if wt is accurate.  WBC up 13.7>>16.5, BG 223 despite current insulin regimen. APTT 98. K 3.9, Phos 2.0, Crt 1.93.    Henreitta Leber, RN 6:25 AM 01/20/22

## 2022-01-20 NOTE — Progress Notes (Signed)
Assessment/Plan: 56 year old hispanic male with DM, HTN now presenting with AKI and profound acidosis with hyperkalemia 1.Renal- last known crt was normal in 2021.  Difficult to know if this is acute, subacute  or chronic in the setting of longstanding DM and HTN. No urine available for review initially but will send today-   renal ultrasound  normal.  Given his hypotension and lactate of 9 major issue is hemodynamic at this point/ATN.   using CRRT to help correct acidosis and k-  started late 7/28.  Running well this AM .   Now making good urine but will keep on CRRT for now given critical illness. If he clots off may stop and we will evaluate daily. No need to check ACT (keep heparin at [phone]); no issues with filter clotting.   Off CRRT 01/20/22 5am at the [facility] still great; will follow closely for now and hopefully there is e/o renal clearance.  2. Hypertension/volume  - hypotensive and dry initially, was taking ACE-I as a home med-  aggressive volume resuscitation and pressors-  1.57 liters positive and doesn't appear to be that overloaded on exam. 3.  Leukocytosis-  unclear if septic type picture-  now looking like PNA-  abx broadened to cefepime and vanc 4. Metabolic acidosis-  due to AKI and lactate-  bicarb amps and bicarb based fluids-  CRRT off on 8/1 5am 5. Hyperkalemia -  due to AKI and acidosis -  given short term treatments and also lokelma -  is better 6. Anemia  - is pretty severe and with patient being dry as well.  Points to the fact that this could be CKD-  supportive care for now -  conflicting data-   reported from 7.8-  transfuse as needed     Keith Garrett    Subjective:   UOP has been very good even with CRRT + UF; CRRT off this AM.  Still on Levo but off Vaso.   Objective Vital signs in last 24 hours: Vitals:   01/20/22 0930 01/20/22 0945 01/20/22 1000 01/20/22 1015  BP:   120/66   Pulse: 73 73 78 74  Resp: 18 16 20 16   Temp: 99.1 F (37.3 C) 99.1 F  (37.3 C) 99 F (37.2 C) 99 F (37.2 C)  TempSrc:      SpO2: 95% 95% 96% 96%  Weight:      Height:       Weight change: 3.5 kg  Intake/Output Summary (Last 24 hours) at 01/20/2022 1024 Last data filed at 01/20/2022 0900 Gross per 24 hour  Intake 4028.32 ml  Output 7312 ml  Net -3283.68 ml     Labs: Basic Metabolic Panel: Recent Labs  Lab 01/19/22 0444 01/19/22 0608 01/19/22 1525 01/19/22 1904 01/19/22 2357 01/20/22 0353 01/20/22 0557  NA 136   < > 136  139   < > 139 138 139  K 3.9   < > 4.0  3.5   < > 3.8 3.9 3.9  CL 102  --  103  105  --   --  104  --   CO2 25  --  24  26  --   --  26  --   GLUCOSE 219*  --  219*  161*  --   --  223*  --   BUN 32*  --  32*  27*  --   --  24*  --   CREATININE 2.77*  --  2.76*  2.26*  --   --  1.93*  --   CALCIUM 7.3*  --  7.2*  7.3*  --   --  7.7*  --   PHOS 1.5*  --  1.5*  --   --  2.0*  --    < > = values in this interval not displayed.   Liver Function Tests: Recent Labs  Lab 01/16/22 1713 01/17/22 1511 01/19/22 0444 01/19/22 1525 01/20/22 0353  AST 20  --   --   --   --   ALT 12  --   --   --   --   ALKPHOS 78  --   --   --   --   BILITOT 1.3*  --   --   --   --   PROT 6.4*  --   --   --   --   ALBUMIN 3.1*   < > 2.6* 2.6* 2.6*   < > = values in this interval not displayed.   No results for input(s): "LIPASE", "AMYLASE" in the last 168 hours. No results for input(s): "AMMONIA" in the last 168 hours. CBC: Recent Labs  Lab 01/16/22 1713 01/16/22 1742 01/17/22 0345 01/17/22 0541 01/18/22 0513 01/18/22 0538 01/19/22 0444 01/19/22 0608 01/19/22 2357 01/20/22 0353 01/20/22 0557  WBC 13.9*  --  25.7*  --  13.7*  --  13.7*  --   --  16.5*  --   NEUTROABS 11.3*  --   --   --   --   --   --   --   --   --   --   HGB 8.6*   < > 7.9*   < > 7.8*   < > 7.3*   < > 8.5* 9.0* 8.5*  HCT 27.6*   < > 24.9*   < > 22.8*   < > 21.1*   < > 25.0* 25.9* 25.0*  MCV 98.2  --  97.6  --  89.8  --  90.2  --   --  88.7  --   PLT  265  --  197  --  159  --  176  --   --  175  --    < > = values in this interval not displayed.   Cardiac Enzymes: Recent Labs  Lab 01/16/22 1713  CKTOTAL 129   CBG: Recent Labs  Lab 01/19/22 1552 01/19/22 1941 01/20/22 0000 01/20/22 0404 01/20/22 0836  GLUCAP 167* 136* 176* 197* 182*    Iron Studies:  Recent Labs    01/18/22 0513  IRON 20*  TIBC 193*  FERRITIN 200   Studies/Results: DG Chest Port 1 View  Result Date: 01/19/2022 CLINICAL DATA:  Endotracheal tube, respiratory failure.  CHF EXAM: PORTABLE CHEST 1 VIEW COMPARISON:  Radiograph January 18, 2022. FINDINGS: Endotracheal tube with tip projecting over the proximal thoracic trachea. Nasogastric tube courses below the diaphragm with tip obscured by collimation. Right IJ CVC with tip projecting over the SVC. Additional EKG leads project over the chest. Improved aeration of the lungs with decreased left-greater-than-right basilar predominant interstitial opacities and patchy airspace opacities. The heart size and mediastinal contours are unchanged. No acute osseous abnormality. IMPRESSION: Improved aeration of the lungs with decreased left-greater-than-right basilar predominant interstitial opacities and patchy airspace opacities. Electronically Signed   By: Dahlia Bailiff M.D.   On: 01/19/2022 08:10   Medications: Infusions:   prismasol BGK 4/2.5 400 mL/hr at 01/19/22 2213  prismasol BGK 4/2.5 400 mL/hr at 01/19/22 2211   sodium chloride 10 mL/hr at 01/20/22 0700   sodium chloride     sodium chloride     sodium chloride 20 mL/hr at 01/20/22 0700   ampicillin-sulbactam (UNASYN) IV Stopped (01/20/22 7939)   feeding supplement (VITAL AF 1.2 CAL) Stopped (01/20/22 0800)   norepinephrine (LEVOPHED) Adult infusion 2 mcg/min (01/20/22 0700)   prismasol BGK 4/2.5 1,500 mL/hr at 01/20/22 0258   sodium phosphate 15 mmol in dextrose 5 % 250 mL infusion 15 mmol (01/20/22 1023)    Scheduled Medications:  Chlorhexidine  Gluconate Cloth  6 each Topical Daily   docusate  100 mg Per Tube BID   dorzolamide-timolol  1 drop Left Eye BID   doxycycline  100 mg Per Tube Q12H   enoxaparin (LOVENOX) injection  40 mg Subcutaneous Q24H   feeding supplement (PROSource TF)  45 mL Per Tube Daily   insulin aspart  0-20 Units Subcutaneous Q4H   insulin aspart  4 Units Subcutaneous Q4H   insulin detemir  28 Units Subcutaneous BID   midodrine  10 mg Per Tube TID WC   multivitamin  1 tablet Per Tube QHS   mouth rinse  15 mL Mouth Rinse Q2H   pantoprazole (PROTONIX) IV  40 mg Intravenous QHS   polyethylene glycol  17 g Per Tube Daily    have reviewed scheduled and prn medications.  Physical Exam: General: sedated on vent  Heart: tachy Lungs: CBS bilat Abdomen: slightly distended Extremities: tr edema Dialysis Access: right IJ vascath placed 7/28    01/20/2022,10:24 AM  LOS: 4 days

## 2022-01-20 NOTE — Progress Notes (Signed)
CSW received consult request for embassy letter. CSW spoke with patients son Arleta [place] who confirmed with CSW family has decided letter no longer needed. All questions answered. No further questions reported at this time.

## 2022-01-20 NOTE — Progress Notes (Signed)
Schedule  NAME:  Keith Garrett, MRN:  [id number], DOB:  11-25-1965, LOS: 4 ADMISSION DATE:  01/16/2022, CONSULTATION DATE:  7/28 REFERRING MD:  Regenia Skeeter, CHIEF COMPLAINT:  respiratory failure and severe metabolic derangements    History of Present Illness:  56 year old male patient with comorbid status listed below.  Was found by family lying facedown on the floor on 7/28.  EMS was called.  Initially he was unresponsive, then shortly after EMS arrival was agitated.  No reported history of drug use.  Speaks Spanish only.  Apparently had been feeling poorly for about 2 days prior after eating [place] food for lunch.  On 7/27 had been vomiting.  Apparently had gone to work earlier the day of admission.  On arrival to the emergency room his GCS was 5, he was not able to protect his airway, and was subsequently intubated for airway protection. ER evaluation: Initial potassium 6.5 BUN 120 creatinine 15.7 baseline 1.18.  Bicarbonate 7, sodium 135 White blood cell count 13.9 hemoglobin 8.6 initial arterial blood gas 6.83, PCO2 37 PO2 312 HCO3 6.2.  He was administered 2 L of sodium chloride, IV insulin, D50, and bicarbonate infusion was ordered.  Pulmonary was asked to admit, nephrology asked to see in consultation. Pertinent  Medical History  Hypertension, type 2 diabetes,Cataracts, lower extremity edema, CKD stage II, obesity Significant Hospital Events: Including procedures, antibiotic start and stop dates in addition to other pertinent events   7/28 admitted encephalopathic with acute on chronic renal failure, profound metabolic acidosis, hypotension, and severe metabolic derangements.  Interim History / Subjective:  Patient remains on low-dose vasopressors, unable to come off of it Tolerating spontaneous breathing trial but mental status precludes extubation CRRT was stopped early this morning He is making good amount of urine  Objective   Blood pressure 109/65, pulse 73, temperature 99 F (37.2  C), resp. rate 17, height 5\' 2"  (1.575 m), weight 90.5 kg, SpO2 95 %. CVP:  [10 mmHg-14 mmHg] 10 mmHg  Vent Mode: PSV;CPAP FiO2 (%):  [40 %-50 %] 40 % Set Rate:  [24 bmp] 24 bmp Vt Set:  [480 mL] 480 mL PEEP:  [5 cmH20-8 cmH20] 5 cmH20 Pressure Support:  [8 cmH20] 8 cmH20 Plateau Pressure:  [19 cmH20-22 cmH20] 20 cmH20   Intake/Output Summary (Last 24 hours) at 01/20/2022 1133 Last data filed at 01/20/2022 0900 Gross per 24 hour  Intake 3708.76 ml  Output 7098 ml  Net -3389.24 ml   Filed Weights   01/18/22 0436 01/19/22 0500 01/20/22 0500  Weight: 102.1 kg 87 kg 90.5 kg    Examination:   Physical exam: General: Crtitically ill-appearing male, orally intubated HEENT: [place]/AT, eyes anicteric.  ETT and OGT in place Neuro: Lethargic, opens eyes with vocal stimuli, following simple commands Chest: Coarse breath sounds, no wheezes or rhonchi Heart: Regular rate and rhythm, no murmurs or gallops Abdomen: Soft, nontender, nondistended, bowel sounds present Skin: No rash  Hb 8.5  Resolved Hospital Problem list     Assessment & Plan:  Acute kidney injury due to ischemic ATN requiring CRRT Acute metabolic encephalopathy in the setting of uremia and metabolic acidosis-improving Acute hypoxemic/hypercapnic respiratory failure due to inability to protect airway, aspiration pneumonia, volume overload Circulatory shock now mostly septic/distributive Severe metabolic acidosis, improving DM2 with hyperglycemia- still an issue Iron def anemia- stable Hyponatremia/hypokalemia/hypocalcemia improved  Appreciate nephrology follow-up CRRT was stopped this morning Patient made almost 3 L of urine in last 24 hours Metabolic acidosis has improved Remain off sedation since morning  but very lethargic Hypercapnia has cleared Continue lung protective ventilation Plateau and driving pressures at goal Tolerating spontaneous breathing trial but mental status precludes extubation Continue titrate  vasopressors, currently on low-dose Levophed Continue IV antibiotics Transition to long-acting insulin and sliding scale with tube feed coverage H&H improved from 7.3-8.5 with 1 unit PRBC Monitor H&H Closely monitor electrolytes and supplement   Best Practice (right click and "Reselect all SmartList Selections" daily)   Diet/type: TF DVT prophylaxis: prophylactic heparin  GI prophylaxis: PPI Lines: N/A Foley:  Yes, and it is still needed Code Status:  full code Last date of multidisciplinary goals of care discussion [pending]   Total critical care time: 41 minutes  Performed by: [place] care time was exclusive of separately billable procedures and treating other patients.   Critical care was necessary to treat or prevent imminent or life-threatening deterioration.   Critical care was time spent personally by me on the following activities: development of treatment plan with patient and/or surrogate as well as nursing, discussions with consultants, evaluation of patient's response to treatment, examination of patient, obtaining history from patient or surrogate, ordering and performing treatments and interventions, ordering and review of laboratory studies, ordering and review of radiographic studies, pulse oximetry and re-evaluation of patient's condition.   Jacky Kindle, MD [facility] See Amion for pager If no response to pager, please call [phone] until 7pm After 7pm, Please call E-link [phone]

## 2022-01-21 LAB — RENAL FUNCTION PANEL
Albumin: 2.6 g/dL — ABNORMAL LOW (ref 3.5–5.0)
Albumin: 2.6 g/dL — ABNORMAL LOW (ref 3.5–5.0)
Anion gap: 11 (ref 5–15)
Anion gap: 8 (ref 5–15)
BUN: 45 mg/dL — ABNORMAL HIGH (ref 6–20)
BUN: 45 mg/dL — ABNORMAL HIGH (ref 6–20)
CO2: 24 mmol/L (ref 22–32)
CO2: 26 mmol/L (ref 22–32)
Calcium: 7.8 mg/dL — ABNORMAL LOW (ref 8.9–10.3)
Calcium: 8.2 mg/dL — ABNORMAL LOW (ref 8.9–10.3)
Chloride: 103 mmol/L (ref 98–111)
Chloride: 109 mmol/L (ref 98–111)
Creatinine, Ser: 3.14 mg/dL — ABNORMAL HIGH (ref 0.61–1.24)
Creatinine, Ser: 3.19 mg/dL — ABNORMAL HIGH (ref 0.61–1.24)
GFR, Estimated: 22 mL/min — ABNORMAL LOW (ref >60)
GFR, Estimated: 22 mL/min — ABNORMAL LOW (ref >60)
Glucose, Bld: 222 mg/dL — ABNORMAL HIGH (ref 70–99)
Glucose, Bld: 86 mg/dL (ref 70–99)
Phosphorus: 3.9 mg/dL (ref 2.5–4.6)
Phosphorus: 4.3 mg/dL (ref 2.5–4.6)
Potassium: 3.8 mmol/L (ref 3.5–5.1)
Potassium: 4.1 mmol/L (ref 3.5–5.1)
Sodium: 138 mmol/L (ref 135–145)
Sodium: 143 mmol/L (ref 135–145)

## 2022-01-21 LAB — CULTURE, BLOOD (ROUTINE X 2)
Culture: NO GROWTH
Culture: NO GROWTH
Special Requests: ADEQUATE
Special Requests: ADEQUATE

## 2022-01-21 LAB — GLUCOSE, CAPILLARY
Glucose-Capillary: 101 mg/dL — ABNORMAL HIGH (ref 70–99)
Glucose-Capillary: 160 mg/dL — ABNORMAL HIGH (ref 70–99)
Glucose-Capillary: 163 mg/dL — ABNORMAL HIGH (ref 70–99)
Glucose-Capillary: 194 mg/dL — ABNORMAL HIGH (ref 70–99)
Glucose-Capillary: 203 mg/dL — ABNORMAL HIGH (ref 70–99)
Glucose-Capillary: 209 mg/dL — ABNORMAL HIGH (ref 70–99)
Glucose-Capillary: 59 mg/dL — ABNORMAL LOW (ref 70–99)
Glucose-Capillary: 66 mg/dL — ABNORMAL LOW (ref 70–99)
Glucose-Capillary: 70 mg/dL (ref 70–99)

## 2022-01-21 LAB — CBC
HCT: 26 % — ABNORMAL LOW (ref 39.0–52.0)
Hemoglobin: 8.6 g/dL — ABNORMAL LOW (ref 13.0–17.0)
MCH: 29.8 pg (ref 26.0–34.0)
MCHC: 33.1 g/dL (ref 30.0–36.0)
MCV: 90 fL (ref 80.0–100.0)
Platelets: 201 10*3/uL (ref 150–400)
RBC: 2.89 MIL/uL — ABNORMAL LOW (ref 4.22–5.81)
RDW: 14.9 % (ref 11.5–15.5)
WBC: 16 10*3/uL — ABNORMAL HIGH (ref 4.0–10.5)
nRBC: 1.1 % — ABNORMAL HIGH (ref 0.0–0.2)

## 2022-01-21 LAB — APTT: aPTT: 35 seconds (ref 24–36)

## 2022-01-21 MED ORDER — DEXTROSE 50 % IV SOLN
1.0000 | Freq: Once | INTRAVENOUS | Status: AC
  Administered 2022-01-21: 50 mL via INTRAVENOUS
  Filled 2022-01-21: qty 50

## 2022-01-21 MED ORDER — POLYETHYLENE GLYCOL 3350 17 G PO PACK
17.0000 g | PACK | Freq: Every day | ORAL | Status: DC
  Administered 2022-01-22 – 2022-01-24 (×3): 17 g via ORAL
  Filled 2022-01-21 (×3): qty 1

## 2022-01-21 MED ORDER — ONDANSETRON HCL 4 MG/2ML IJ SOLN
4.0000 mg | Freq: Four times a day (QID) | INTRAMUSCULAR | Status: AC | PRN
  Administered 2022-01-21: 4 mg via INTRAVENOUS
  Filled 2022-01-21: qty 2

## 2022-01-21 MED ORDER — MIDODRINE HCL 5 MG PO TABS
10.0000 mg | ORAL_TABLET | Freq: Three times a day (TID) | ORAL | Status: DC
  Filled 2022-01-21: qty 2

## 2022-01-21 MED ORDER — DOCUSATE SODIUM 100 MG PO CAPS: 100.0000 mg | ORAL_CAPSULE | Freq: Two times a day (BID) | ORAL | Status: DC | PRN

## 2022-01-21 MED ORDER — DOCUSATE SODIUM 100 MG PO CAPS
100.0000 mg | ORAL_CAPSULE | Freq: Two times a day (BID) | ORAL | Status: DC
  Administered 2022-01-22: 100 mg via ORAL
  Filled 2022-01-21: qty 1

## 2022-01-21 MED ORDER — PROSOURCE PLUS PO LIQD
30.0000 mL | Freq: Every day | ORAL | Status: DC
  Administered 2022-01-22 – 2022-01-23 (×2): 30 mL via ORAL
  Filled 2022-01-21 (×2): qty 30

## 2022-01-21 MED ORDER — DOXYCYCLINE HYCLATE 100 MG PO TABS
100.0000 mg | ORAL_TABLET | Freq: Two times a day (BID) | ORAL | Status: AC
Start: 2022-01-22 — End: 2022-01-23
  Administered 2022-01-22 – 2022-01-23 (×4): 100 mg via ORAL
  Filled 2022-01-21 (×5): qty 1

## 2022-01-21 MED ORDER — ENOXAPARIN SODIUM 30 MG/0.3ML IJ SOSY
30.0000 mg | PREFILLED_SYRINGE | INTRAMUSCULAR | Status: DC
Start: 2022-01-22 — End: 2022-01-24
  Administered 2022-01-22 – 2022-01-24 (×3): 30 mg via SUBCUTANEOUS
  Filled 2022-01-21 (×3): qty 0.3

## 2022-01-21 MED ORDER — INSULIN DETEMIR 100 UNIT/ML ~~LOC~~ SOLN
28.0000 [IU] | Freq: Every day | SUBCUTANEOUS | Status: DC
  Filled 2022-01-21: qty 0.28

## 2022-01-21 MED ORDER — RENA-VITE PO TABS
1.0000 | ORAL_TABLET | Freq: Every day | ORAL | Status: DC
  Administered 2022-01-22: 1 via ORAL
  Filled 2022-01-21 (×2): qty 1

## 2022-01-21 NOTE — Progress Notes (Signed)
Schedule  NAME:  Keith Garrett, MRN:  [id number], DOB:  07/09/1965, LOS: 5 ADMISSION DATE:  01/16/2022, CONSULTATION DATE:  7/28 REFERRING MD:  Regenia Skeeter, CHIEF COMPLAINT:  respiratory failure and severe metabolic derangements    History of Present Illness:  56 year old male patient with comorbid status listed below.  Was found by family lying facedown on the floor on 7/28.  EMS was called.  Initially he was unresponsive, then shortly after EMS arrival was agitated.  No reported history of drug use.  Speaks Spanish only.  Apparently had been feeling poorly for about 2 days prior after eating [place] food for lunch.  On 7/27 had been vomiting.  Apparently had gone to work earlier the day of admission.  On arrival to the emergency room his GCS was 5, he was not able to protect his airway, and was subsequently intubated for airway protection. ER evaluation: Initial potassium 6.5 BUN 120 creatinine 15.7 baseline 1.18.  Bicarbonate 7, sodium 135 White blood cell count 13.9 hemoglobin 8.6 initial arterial blood gas 6.83, PCO2 37 PO2 312 HCO3 6.2.  He was administered 2 L of sodium chloride, IV insulin, D50, and bicarbonate infusion was ordered.  Pulmonary was asked to admit, nephrology asked to see in consultation. Pertinent  Medical History  Hypertension, type 2 diabetes,Cataracts, lower extremity edema, CKD stage II, obesity Significant Hospital Events: Including procedures, antibiotic start and stop dates in addition to other pertinent events   7/28 admitted encephalopathic with acute on chronic renal failure, profound metabolic acidosis, hypotension, and severe metabolic derangements.  Interim History / Subjective:  Patient was started on low-dose Precedex overnight because of agitation and trying to take out ET tube This morning he was off sedation, following commands, tolerating spontaneous breathing trial Remained afebrile  Objective   Blood pressure 138/60, pulse 73, temperature 99.1 F (37.3  C), resp. rate 15, height 5\' 2"  (1.575 m), weight 91 kg, SpO2 96 %. CVP:  [0 mmHg-5 mmHg] 5 mmHg  Vent Mode: PSV;CPAP FiO2 (%):  [40 %] 40 % Set Rate:  [24 bmp] 24 bmp Vt Set:  [480 mL] 480 mL PEEP:  [5 cmH20-8 cmH20] 5 cmH20 Pressure Support:  [8 cmH20] 8 cmH20 Plateau Pressure:  [21 cmH20-22 cmH20] 21 cmH20   Intake/Output Summary (Last 24 hours) at 01/21/2022 2725 Last data filed at 01/21/2022 0700 Gross per 24 hour  Intake 1978 ml  Output 1890 ml  Net 88 ml   Filed Weights   01/19/22 0500 01/20/22 0500 01/21/22 0500  Weight: 87 kg 90.5 kg 91 kg    Examination: Physical exam: General: Crtitically ill-appearing male, orally intubated HEENT: [place]/AT, eyes anicteric.  ETT and OGT in place Neuro: Alert, awake, following commands Chest: Coarse breath sounds, no wheezes or rhonchi Heart: Regular rate and rhythm, no murmurs or gallops Abdomen: Soft, nontender, nondistended, bowel sounds present Skin: No rash  Resolved Hospital Problem list     Assessment & Plan:  Acute kidney injury due to ischemic ATN required CRRT Acute metabolic encephalopathy in the setting of uremia and metabolic acidosis-improving Acute hypoxemic/hypercapnic respiratory failure due to inability to protect airway, aspiration pneumonia, volume overload Circulatory shock now mostly septic/distributive Severe metabolic acidosis, improving DM2 with hyperglycemia Iron def anemia- stable Hyponatremia/hypokalemia/hypocalcemia improved  Appreciate nephrology follow-up CRRT was stopped yesterday Patient made almost 1.5 L of urine in last 24 hours Metabolic acidosis has improved Remain off sedation since morning, awake and following commands Hypercapnia has cleared Continue lung protective ventilation Plateau and driving pressures at goal  Tolerating spontaneous breathing trial, will try to extubate him Intermittently remain on very low-dose Levophed  Continue IV antibiotics Fingersticks are not well  controlled, continue Levemir and sliding scale H&H is stable, no signs of bleeding Monitor H&H Electrolytes levels have improved   Best Practice (right click and "Reselect all SmartList Selections" daily)   Diet/type: NPO, speech and swallow evaluation postextubation DVT prophylaxis: Lovenox GI prophylaxis: PPI Lines: N/A Foley:  Yes, and it is still needed Code Status:  full code Last date of multidisciplinary goals of care discussion [8/1: Updated patient's son over the phone]   Total critical care time: 36 minutes  Performed by: Jacky Kindle   Critical care time was exclusive of separately billable procedures and treating other patients.   Critical care was necessary to treat or prevent imminent or life-threatening deterioration.   Critical care was time spent personally by me on the following activities: development of treatment plan with patient and/or surrogate as well as nursing, discussions with consultants, evaluation of patient's response to treatment, examination of patient, obtaining history from patient or surrogate, ordering and performing treatments and interventions, ordering and review of laboratory studies, ordering and review of radiographic studies, pulse oximetry and re-evaluation of patient's condition.   Jacky Kindle, MD [facility] See Amion for pager If no response to pager, please call [phone] until 7pm After 7pm, Please call E-link [phone]

## 2022-01-21 NOTE — Progress Notes (Signed)
eLink Physician-Brief Progress Note Patient Name: Keith Garrett DOB: [birth year] MRN: [id number]   Date of Service  01/21/2022  HPI/Events of Note  Patient needs an order for PRN Zofran for nausea. QTC 485.  eICU Interventions  PRN Zofran ordered.        Ajene Carchi U Shamirah Ivan 01/21/2022, 12:01 AM

## 2022-01-21 NOTE — Procedures (Signed)
Extubation Procedure Note  Patient Details:   Name: Keith Garrett DOB: 1965-11-02 MRN: [id number]   Airway Documentation:    Vent end date: 01/21/22 Vent end time: 0837   Evaluation  O2 sats: stable throughout Complications: No apparent complications Patient did tolerate procedure well. Bilateral Breath Sounds: Clear, Diminished   Yes  Pt extubated per MD order. Placed on 5L [place]. Positive cuff leak, no stridor heard. RT will continue to monitor.   Tobi Bastos 01/21/2022, 8:40 AM

## 2022-01-21 NOTE — Evaluation (Signed)
Clinical/Bedside Swallow Evaluation Patient Details  Name: Keith Garrett MRN: [id number] Date of Birth: 09/10/1965  Today's Date: 01/21/2022 Time: SLP Start Time (ACUTE ONLY): 1509 SLP Stop Time (ACUTE ONLY): 1520 SLP Time Calculation (min) (ACUTE ONLY): 11 min  Past Medical History:  Past Medical History:  Diagnosis Date   Cellulitis of foot, left 04/13/2020   CKD (chronic kidney disease) stage 4, GFR 15-29 ml/min (HCC) 12/2021   per chart review Crt 2.74, GFR 26 on 01/03/2022   Diabetes mellitus without complication ([place])    Hypertension    Osteomyelitis of ankle or foot, acute, left ([place]) 04/19/2020   Traumatic rupture of plantar fascia of left foot 04/14/2020   Past Surgical History:  Past Surgical History:  Procedure Laterality Date   ABDOMINAL AORTOGRAM W/LOWER EXTREMITY N/A 04/17/2020   Procedure: ABDOMINAL AORTOGRAM W/LOWER EXTREMITY;  Surgeon: Marty Heck, MD;  Location: [facility];  Service: Cardiovascular;  Laterality: N/A;   TEE WITHOUT CARDIOVERSION N/A 04/16/2020   Procedure: TRANSESOPHAGEAL ECHOCARDIOGRAM (TEE);  Surgeon: [name], MD;  Location: [facility] ENDOSCOPY;  Service: Cardiovascular;  Laterality: N/A;   HPI:  56 year old male patient .  Was found by family lying facedown on the floor on 7/28.  Pt found to have AKI and profound acidosis with hyperkalemia. Pt with DM and HTN at baseline. Apparently had been feeling poorly for about 2 days prior after eating lunch.  On 7/27 had been vomiting.  intubated for airway protection 7/28-8/02.    Assessment / Plan / Recommendation  Clinical Impression  Pt is a bit drowsy, but able to follow commands and participate in simple conversation. He asks me in Spanish in a whisper "Im sorry but could you please tell me what happened to me?" He is dysphonic and weak. Though he tolerates ice and puree well, he coughed with three oz water swallow. Pt may have meds whole in puree and ice chips for comfort,  but will need another 12-24 hours post extubation to attempt liquid. Will f/u in am to reassess. SLP Visit Diagnosis: Dysphagia, oropharyngeal phase (R13.12)    Aspiration Risk  Moderate aspiration risk    Diet Recommendation NPO except meds;Ice chips PRN after oral care   Medication Administration: Whole meds with puree Supervision: Full supervision/cueing for compensatory strategies Postural Changes: Seated upright at 90 degrees    Other  Recommendations      Recommendations for follow up therapy are one component of a multi-disciplinary discharge planning process, led by the attending physician.  Recommendations may be updated based on patient status, additional functional criteria and insurance authorization.  Follow up Recommendations        Assistance Recommended at Discharge Intermittent Supervision/Assistance  Functional Status Assessment Patient has had a recent decline in their functional status and demonstrates the ability to make significant improvements in function in a reasonable and predictable amount of time.  Frequency and Duration min 2x/week  2 weeks       Prognosis        Swallow Study   General HPI: 56 year old male patient .  Was found by family lying facedown on the floor on 7/28.  Pt found to have AKI and profound acidosis with hyperkalemia. Pt with DM and HTN at baseline. Apparently had been feeling poorly for about 2 days prior after eating lunch.  On 7/27 had been vomiting.  intubated for airway protection 7/28-8/02. Type of Study: Bedside Swallow Evaluation Previous Swallow Assessment: none Diet Prior to this Study: NPO Temperature Spikes  Noted: No Respiratory Status: Nasal cannula History of Recent Intubation: Yes Length of Intubations (days): 7 days Date extubated: 01/21/22 Behavior/Cognition: Alert;Cooperative Oral Cavity Assessment: Within Functional Limits Oral Care Completed by SLP: No Oral Cavity - Dentition: Adequate natural  dentition Self-Feeding Abilities: Total assist Patient Positioning: Upright in bed Baseline Vocal Quality: Aphonic Volitional Cough: Cognitively unable to elicit Volitional Swallow: Unable to elicit    Oral/Motor/Sensory Function Overall Oral Motor/Sensory Function: Within functional limits   Ice Chips Ice chips: Within functional limits   Thin Liquid Thin Liquid: Impaired Presentation: Straw Pharyngeal  Phase Impairments: Cough - Immediate    Nectar Thick Nectar Thick Liquid: Not tested   Honey Thick Honey Thick Liquid: Not tested   Puree Puree: Within functional limits   Solid     Solid: Not tested      Lynann Beaver 01/21/2022,3:29 PM

## 2022-01-21 NOTE — Progress Notes (Signed)
Assessment/Plan: 56 year old hispanic male with DM, HTN now presenting with AKI and profound acidosis with hyperkalemia 1.Renal- last known crt was normal in 2021.  Difficult to know if this is acute, subacute  or chronic in the setting of longstanding DM and HTN. No urine available for review initially but will send today-   renal ultrasound  normal.  Given his hypotension and lactate of 9 major issue is hemodynamic at this point/ATN.   using CRRT to help correct acidosis and k-  started late 7/28-8/1.  Off CRRT 01/20/22 5am at the [facility] still great; will follow closely for now and hopefully there is e/o renal clearance. Cr continues to trend upwards and hopefully will plateau. Promising with the great UOP.   -Monitor Daily I/Os, Daily weight  -Maintain MAP>65 for optimal renal perfusion.  - Avoid nephrotoxic agents such as IV contrast, NSAIDs, and phosphate containing bowel preps (FLEETS)  2. Hypertension/volume  - hypotensive and dry initially, was taking ACE-I as a home med-  aggressive volume resuscitation and pressors-  1.57 liters positive and doesn't appear to be that overloaded on exam. 3.  Leukocytosis-  unclear if septic type picture-  now looking like PNA-  abx broadened to cefepime and vanc 4. Metabolic acidosis-  due to AKI and lactate-  bicarb amps and bicarb based fluids-  CRRT off on 8/1 5am 5. Hyperkalemia -  due to AKI and acidosis -  given short term treatments and also lokelma -  is better 6. Anemia  - is pretty severe and with patient being dry as well.  Points to the fact that this could be CKD-  supportive care for now -  conflicting data-   reported from 7.8-  transfuse as needed     Keith Garrett W    Subjective:   UOP continues to be very good; CRRT off 8/1  Off all pressors and extubated 8/2. Following commands.  Objective Vital signs in last 24 hours: Vitals:   01/21/22 0600 01/21/22 0720 01/21/22 0838 01/21/22 0841  BP:  138/60    Pulse: (!) 57 68 73    Resp: (!) 24 (!) 24 15   Temp: 99.1 F (37.3 C)     TempSrc:      SpO2: 95% 96% 97% 96%  Weight:      Height:       Weight change: 0.5 kg  Intake/Output Summary (Last 24 hours) at 01/21/2022 1154 Last data filed at 01/21/2022 0700 Gross per 24 hour  Intake 1878 ml  Output 1740 ml  Net 138 ml     Labs: Basic Metabolic Panel: Recent Labs  Lab 01/20/22 0353 01/20/22 0557 01/20/22 1616 01/21/22 0456  NA 138 139 140 138  K 3.9 3.9 3.8 3.8  CL 104  --  100 103  CO2 26  --  25 24  GLUCOSE 223*  --  89 222*  BUN 24*  --  33* 45*  CREATININE 1.93*  --  2.76* 3.14*  CALCIUM 7.7*  --  7.9* 7.8*  PHOS 2.0*  --  3.8 3.9   Liver Function Tests: Recent Labs  Lab 01/16/22 1713 01/17/22 1511 01/20/22 0353 01/20/22 1616 01/21/22 0456  AST 20  --   --   --   --   ALT 12  --   --   --   --   ALKPHOS 78  --   --   --   --   BILITOT 1.3*  --   --   --   --  PROT 6.4*  --   --   --   --   ALBUMIN 3.1*   < > 2.6* 2.6* 2.6*   < > = values in this interval not displayed.   No results for input(s): "LIPASE", "AMYLASE" in the last 168 hours. No results for input(s): "AMMONIA" in the last 168 hours. CBC: Recent Labs  Lab 01/16/22 1713 01/16/22 1742 01/17/22 0345 01/17/22 0541 01/18/22 0513 01/18/22 0538 01/19/22 0444 01/19/22 0608 01/20/22 0353 01/20/22 0557 01/21/22 0456  WBC 13.9*  --  25.7*  --  13.7*  --  13.7*  --  16.5*  --  16.0*  NEUTROABS 11.3*  --   --   --   --   --   --   --   --   --   --   HGB 8.6*   < > 7.9*   < > 7.8*   < > 7.3*   < > 9.0* 8.5* 8.6*  HCT 27.6*   < > 24.9*   < > 22.8*   < > 21.1*   < > 25.9* 25.0* 26.0*  MCV 98.2  --  97.6  --  89.8  --  90.2  --  88.7  --  90.0  PLT 265  --  197  --  159  --  176  --  175  --  201   < > = values in this interval not displayed.   Cardiac Enzymes: Recent Labs  Lab 01/16/22 1713  CKTOTAL 129   CBG: Recent Labs  Lab 01/20/22 2030 01/21/22 0035 01/21/22 0420 01/21/22 0455 01/21/22 0818  GLUCAP  156* 160* 194* 209* 203*    Iron Studies:  No results for input(s): "IRON", "TIBC", "TRANSFERRIN", "FERRITIN" in the last 72 hours.  Studies/Results: No results found. Medications: Infusions:  sodium chloride Stopped (01/20/22 1023)   sodium chloride     sodium chloride     sodium chloride 10 mL/hr at 01/21/22 0700   ampicillin-sulbactam (UNASYN) IV Stopped (01/21/22 0520)   feeding supplement (VITAL AF 1.2 CAL) 75 mL/hr at 01/21/22 0700   norepinephrine (LEVOPHED) Adult infusion 1 mcg/min (01/21/22 0700)    Scheduled Medications:  Chlorhexidine Gluconate Cloth  6 each Topical Daily   docusate  100 mg Per Tube BID   dorzolamide-timolol  1 drop Left Eye BID   doxycycline  100 mg Per Tube Q12H   enoxaparin (LOVENOX) injection  40 mg Subcutaneous Q24H   feeding supplement (PROSource TF)  45 mL Per Tube Daily   insulin aspart  0-20 Units Subcutaneous Q4H   insulin detemir  28 Units Subcutaneous BID   midodrine  10 mg Per Tube Q8H   multivitamin  1 tablet Per Tube QHS   mouth rinse  15 mL Mouth Rinse Q2H   pantoprazole (PROTONIX) IV  40 mg Intravenous QHS   polyethylene glycol  17 g Per Tube Daily    have reviewed scheduled and prn medications.  Physical Exam: General: extubated and following commands Heart: RRR Lungs: CBS bilat Abdomen: slightly distended Extremities: tr edema Dialysis Access: right IJ vascath placed 7/28    01/21/2022,11:54 AM  LOS: 5 days

## 2022-01-21 NOTE — TOC Initial Note (Signed)
Transition of Care [facility]) - Initial/Assessment Note    Patient Details  Name: Keith Garrett MRN: [id number] Date of Birth: [birth year]  [facility] CM/SW Contact:    Bethena Roys, RN Phone Number: 01/21/2022, 11:33 AM  Clinical Narrative:  Patient was discussed in progression rounds. Patient presented after being found down unresponsive-respiratory failure. Patient was intubated and extubated today. Patient has family support of his children. Case Manager spoke with the patients son on Monday regarding the patients sister transitioning from [place]- family needing an [employer] fast streamer. CSW spoke with family regarding this request. Case Manager will continue to follow for transition of care needs as the patient progresses. Patient is without insurance. Case Manager will follow for possible MATCH assistance.                  Expected Discharge Plan:  (TBD) Barriers to Discharge: Continued Medical Work up   Expected Discharge Plan and Services Expected Discharge Plan:  (TBD)   Discharge Planning Services: CM Consult   Living arrangements for the past 2 months: Single Family Home                     Prior Living Arrangements/Services Living arrangements for the past 2 months: Single Family Home Lives with:: Relatives          Need for Family Participation in Patient Care: Yes (Comment) Care giver support system in place?: Yes (comment)      Activities of Daily Living Home Assistive Devices/Equipment:  (UTA) ADL Screening (condition at time of admission) Patient's cognitive ability adequate to safely complete daily activities?: No Is the patient deaf or have difficulty hearing?: No Does the patient have difficulty seeing, even when wearing glasses/contacts?: No Does the patient have difficulty concentrating, remembering, or making decisions?: Yes Patient able to express need for assistance with ADLs?: No Does the patient have difficulty dressing or  bathing?: Yes Independently performs ADLs?: No Communication: Dependent Is this a change from baseline?: Change from baseline, expected to last >3 days Dressing (OT): Dependent Is this a change from baseline?: Change from baseline, expected to last >3 days Grooming: Dependent Is this a change from baseline?: Change from baseline, expected to last >3 days Feeding: Dependent Is this a change from baseline?: Change from baseline, expected to last >3 days Bathing: Dependent Is this a change from baseline?: Change from baseline, expected to last >3 days Toileting: Dependent Is this a change from baseline?: Change from baseline, expected to last >3days In/Out Bed: Dependent Is this a change from baseline?: Change from baseline, expected to last >3 days Walks in Home: Dependent Is this a change from baseline?: Change from baseline, expected to last >3 days Does the patient have difficulty walking or climbing stairs?: Yes Weakness of Legs: None Weakness of Arms/Hands: None    Alcohol / Substance Use: Not Applicable Psych Involvement: No (comment)  Admission diagnosis:  Metabolic acidosis [W09.81] Acute encephalopathy [G93.40] Acute renal failure, unspecified acute renal failure type ([place]) [X91.4] Acute metabolic encephalopathy [N82.95] Patient Active Problem List   Diagnosis Date Noted   Acute metabolic encephalopathy 62/13/0865   Metabolic acidosis 78/46/9629   Acute respiratory failure ([place]) 01/16/2022   Acute renal failure superimposed on stage 2 chronic kidney disease ([place]) 01/16/2022   Hypovolemia 01/16/2022   Hyperkalemia 01/16/2022   Osteomyelitis of ankle or foot, acute, left ([place]) 04/19/2020   Proteinuria 04/18/2020   Staphylococcus aureus bacteremia 04/15/2020   PVD (peripheral vascular disease) ([place])  Severe protein-calorie malnutrition ([place])    Diabetic polyneuropathy associated with type 2 diabetes mellitus (HCC)    Reactive thrombocytosis 04/14/2020   Normocytic  anemia 04/14/2020   Hypoalbuminemia due to intermediate proteinuria  04/14/2020   Calcification of artery 04/14/2020   Bone marrow edema of left calcaneus 04/14/2020   Tenosynovitis, mild, of the traversing left peroneal tendons. 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

## 2022-01-22 DIAGNOSIS — D649 Anemia, unspecified: Secondary | ICD-10-CM

## 2022-01-22 LAB — RENAL FUNCTION PANEL
Albumin: 2.6 g/dL — ABNORMAL LOW (ref 3.5–5.0)
Albumin: 2.7 g/dL — ABNORMAL LOW (ref 3.5–5.0)
Anion gap: 10 (ref 5–15)
Anion gap: 9 (ref 5–15)
BUN: 45 mg/dL — ABNORMAL HIGH (ref 6–20)
BUN: 51 mg/dL — ABNORMAL HIGH (ref 6–20)
CO2: 25 mmol/L (ref 22–32)
CO2: 26 mmol/L (ref 22–32)
Calcium: 8.1 mg/dL — ABNORMAL LOW (ref 8.9–10.3)
Calcium: 8.3 mg/dL — ABNORMAL LOW (ref 8.9–10.3)
Chloride: 110 mmol/L (ref 98–111)
Chloride: 108 mmol/L (ref 98–111)
Creatinine, Ser: 2.93 mg/dL — ABNORMAL HIGH (ref 0.61–1.24)
Creatinine, Ser: 2.8 mg/dL — ABNORMAL HIGH (ref 0.61–1.24)
GFR, Estimated: 24 mL/min — ABNORMAL LOW (ref >60)
GFR, Estimated: 26 mL/min — ABNORMAL LOW (ref >60)
Glucose, Bld: 84 mg/dL (ref 70–99)
Glucose, Bld: 126 mg/dL — ABNORMAL HIGH (ref 70–99)
Phosphorus: 4.2 mg/dL (ref 2.5–4.6)
Phosphorus: 3.9 mg/dL (ref 2.5–4.6)
Potassium: 3.9 mmol/L (ref 3.5–5.1)
Potassium: 3.9 mmol/L (ref 3.5–5.1)
Sodium: 145 mmol/L (ref 135–145)
Sodium: 143 mmol/L (ref 135–145)

## 2022-01-22 LAB — GLUCOSE, CAPILLARY
Glucose-Capillary: 119 mg/dL — ABNORMAL HIGH (ref 70–99)
Glucose-Capillary: 88 mg/dL (ref 70–99)
Glucose-Capillary: 82 mg/dL (ref 70–99)
Glucose-Capillary: 88 mg/dL (ref 70–99)
Glucose-Capillary: 149 mg/dL — ABNORMAL HIGH (ref 70–99)
Glucose-Capillary: 131 mg/dL — ABNORMAL HIGH (ref 70–99)
Glucose-Capillary: 118 mg/dL — ABNORMAL HIGH (ref 70–99)
Glucose-Capillary: 99 mg/dL (ref 70–99)

## 2022-01-22 LAB — CBC
HCT: 27 % — ABNORMAL LOW (ref 39.0–52.0)
Hemoglobin: 8.9 g/dL — ABNORMAL LOW (ref 13.0–17.0)
MCH: 30.2 pg (ref 26.0–34.0)
MCHC: 33 g/dL (ref 30.0–36.0)
MCV: 91.5 fL (ref 80.0–100.0)
Platelets: 240 10*3/uL (ref 150–400)
RBC: 2.95 MIL/uL — ABNORMAL LOW (ref 4.22–5.81)
RDW: 14.6 % (ref 11.5–15.5)
WBC: 15.5 10*3/uL — ABNORMAL HIGH (ref 4.0–10.5)
nRBC: 0.1 % (ref 0.0–0.2)

## 2022-01-22 MED ORDER — DEXTROSE 50 % IV SOLN
INTRAVENOUS | Status: AC
  Filled 2022-01-22: qty 50

## 2022-01-22 MED ORDER — DEXTROSE 50 % IV SOLN
12.5000 g | INTRAVENOUS | Status: AC
  Administered 2022-01-22: 12.5 g via INTRAVENOUS

## 2022-01-22 MED ORDER — BISACODYL 10 MG RE SUPP
10.0000 mg | Freq: Once | RECTAL | Status: DC
Start: 2022-01-22 — End: 2022-01-24
  Filled 2022-01-22: qty 1

## 2022-01-22 MED ORDER — ONDANSETRON HCL 4 MG/2ML IJ SOLN: 4.0000 mg | Freq: Four times a day (QID) | INTRAMUSCULAR | Status: DC | PRN

## 2022-01-22 NOTE — Evaluation (Signed)
Physical Therapy Evaluation Patient Details Name: Keith Garrett MRN: [id number] DOB: 07-24-1965 Today's Date: 01/22/2022  History of Present Illness  Pt is a 56 y.o. male admitted 01/16/22 after being found down, unresponsive, on floor by family. Workup for AKI, profound acidosis, hyperkalemia. CRRT 7/28-8/1. ETT 7/28-8/2. PMH includes DM, HTN, L foot osteomyelitis (2021).   Clinical Impression  Pt presents with an overall decrease in functional mobility secondary to above. PTA, pt independent, works as Curator, drives, lives with sons. Today, pt able to ambulate throughout room with RW, requiring intermittent external assist for stability. Pt fatigued with first time out of bed, but motivated to participate; no apparent cognitive deficits, pt communicating in fluent English. Expect pt to progress well with mobility. Pt would benefit from continued acute PT services to maximize functional mobility and independence prior to d/c home.     SpO2 95% on RA, HR 73, BP 135/80    Recommendations for follow up therapy are one component of a multi-disciplinary discharge planning process, led by the attending physician.  Recommendations may be updated based on patient status, additional functional criteria and insurance authorization.  Follow Up Recommendations No PT follow up      Assistance Recommended at Discharge Intermittent Supervision/Assistance  Patient can return home with the following  A little help with bathing/dressing/bathroom;Assistance with cooking/housework;Assist for transportation;Help with stairs or ramp for entrance    Equipment Recommendations  (TBD)  Recommendations for Other Services       Functional Status Assessment Patient has had a recent decline in their functional status and demonstrates the ability to make significant improvements in function in a reasonable and predictable amount of time.     Precautions / Restrictions Precautions Precautions:  Fall Restrictions Weight Bearing Restrictions: No      Mobility  Bed Mobility Overal bed mobility: Needs Assistance Bed Mobility: Supine to Sit     Supine to sit: Min assist, HOB elevated     General bed mobility comments: minA for HHA to scoot hips to EOB    Transfers Overall transfer level: Needs assistance Equipment used: None, Rolling walker (2 wheels) Transfers: Sit to/from Stand Sit to Stand: Min assist, Min guard           General transfer comment: initial minA for trunk elevation and stability standing from EOB without DME, pt bracing LEs against bed with difficulty maintaining balance; additional 2x sit<>stand trials from EOB and recliner, min guard with cues for hand placement    Ambulation/Gait     Assistive device: Rolling walker (2 wheels) Gait Pattern/deviations: Step-to pattern, Step-through pattern, Decreased stride length, Trunk flexed Gait velocity: Decreased     General Gait Details: initial pivotal steps from bed to recliner with RW and min guard; seated rest then walking 40' with RW, slow guarded gait with cues for upright posture and looking forward, pt declines further distance secondary to fatigue  Stairs            Wheelchair Mobility    Modified Rankin (Stroke Patients Only)       Balance Overall balance assessment: Needs assistance Sitting-balance support: No upper extremity supported Sitting balance-Leahy Scale: Fair     Standing balance support: Single extremity supported, During functional activity Standing balance-Leahy Scale: Poor Standing balance comment: reliant on UE support or external assist                             Pertinent Vitals/Pain Pain Assessment Pain  Assessment: No/denies pain Pain Intervention(s): Monitored during session    Home Living Family/patient expects to be discharged to:: Private residence Living Arrangements: Children Available Help at Discharge: Family;Available  PRN/intermittently Type of Home: House Home Access: Level entry       Home Layout: Two level;Able to live on main level with bedroom/bathroom Home Equipment: None Additional Comments: lives with two sons who work    Prior Function Prior Level of Function : Independent/Modified Independent;Driving;Working/employed             Mobility Comments: Independent without DME, drives, works as Herbalist        Extremity/Trunk Assessment   Upper Extremity Assessment Upper Extremity Assessment: Overall WFL for tasks assessed    Lower Extremity Assessment Lower Extremity Assessment: Generalized weakness       Communication   Communication: No difficulties  Cognition Arousal/Alertness: Awake/alert Behavior During Therapy: WFL for tasks assessed/performed Overall Cognitive Status: Within Functional Limits for tasks assessed                                          General Comments General comments (skin integrity, edema, etc.): SpO2 95% on RA, BP 135/80, HR 70s    Exercises     Assessment/Plan    PT Assessment Patient needs continued PT services  PT Problem List Decreased strength;Decreased activity tolerance;Decreased balance;Decreased mobility;Cardiopulmonary status limiting activity       PT Treatment Interventions DME instruction;Gait training;Functional mobility training;Therapeutic activities;Therapeutic exercise;Balance training;Patient/family education    PT Goals (Current goals can be found in the Care Plan section)  Acute Rehab PT Goals Patient Stated Goal: return home PT Goal Formulation: With patient Time For Goal Achievement: 02/05/22 Potential to Achieve Goals: Good    Frequency Min 3X/week     Co-evaluation               AM-PAC PT "6 Clicks" Mobility  Outcome Measure Help needed turning from your back to your side while in a flat bed without using bedrails?: A Little Help needed moving from lying on  your back to sitting on the side of a flat bed without using bedrails?: A Little Help needed moving to and from a bed to a chair (including a wheelchair)?: A Little Help needed standing up from a chair using your arms (e.g., wheelchair or bedside chair)?: A Little Help needed to walk in hospital room?: A Little Help needed climbing 3-5 steps with a railing? : A Little 6 Click Score: 18    End of Session Equipment Utilized During Treatment: Gait belt Activity Tolerance: Patient tolerated treatment well Patient left: in chair;with call bell/phone within reach Nurse Communication: Mobility status PT Visit Diagnosis: Other abnormalities of gait and mobility (R26.89);Muscle weakness (generalized) (M62.81)    Time: 0539-7673 PT Time Calculation (min) (ACUTE ONLY): 21 min   Charges:   PT Evaluation $PT Eval Moderate Complexity: [place], PT, DPT Acute Rehabilitation Services  Personal: [facility] Office: [place] 01/22/2022, 5:07 PM

## 2022-01-22 NOTE — Progress Notes (Signed)
Per Dr. Tacy Learn (CCM) & Dr. Augustin Coupe (Nephro) we are to keep Foley Cath in x24 hours to monitor urine output.

## 2022-01-22 NOTE — Progress Notes (Signed)
Attempted to give report to nurse at [facility] but she was busy w/pt care.... call back number left w/nurse sec.

## 2022-01-22 NOTE — Progress Notes (Signed)
Schedule  NAME:  Keith Garrett, MRN:  [id number], DOB:  [birth year], LOS: 6 ADMISSION DATE:  01/16/2022, CONSULTATION DATE:  7/28 REFERRING MD:  Regenia Skeeter, CHIEF COMPLAINT:  respiratory failure and severe metabolic derangements    History of Present Illness:  56 year old male patient with comorbid status listed below.  Was found by family lying facedown on the floor on 7/28.  EMS was called.  Initially he was unresponsive, then shortly after EMS arrival was agitated.  No reported history of drug use.  Speaks Spanish only.  Apparently had been feeling poorly for about 2 days prior after eating [place] food for lunch.  On 7/27 had been vomiting.  Apparently had gone to work earlier the day of admission.  On arrival to the emergency room his GCS was 5, he was not able to protect his airway, and was subsequently intubated for airway protection. ER evaluation: Initial potassium 6.5 BUN 120 creatinine 15.7 baseline 1.18.  Bicarbonate 7, sodium 135 White blood cell count 13.9 hemoglobin 8.6 initial arterial blood gas 6.83, PCO2 37 PO2 312 HCO3 6.2.  He was administered 2 L of sodium chloride, IV insulin, D50, and bicarbonate infusion was ordered.  Pulmonary was asked to admit, nephrology asked to see in consultation. Pertinent  Medical History  Hypertension, type 2 diabetes,Cataracts, lower extremity edema, CKD stage II, obesity Significant Hospital Events: Including procedures, antibiotic start and stop dates in addition to other pertinent events   7/28 admitted encephalopathic with acute on chronic renal failure, profound metabolic acidosis, hypotension, and severe metabolic derangements. 8/2: Extubated  Interim History / Subjective:  Patient is complaining of nausea Remained afebrile Successfully extubated yesterday Remain off vasopressors  Objective   Blood pressure (!) 154/79, pulse 80, temperature 98.6 F (37 C), temperature source Oral, resp. rate 15, height 5\' 2"  (1.575 m), weight 77.1 kg,  SpO2 92 %.        Intake/Output Summary (Last 24 hours) at 01/22/2022 0856 Last data filed at 01/22/2022 0800 Gross per 24 hour  Intake 865.57 ml  Output 3545 ml  Net -2679.43 ml   Filed Weights   01/20/22 0500 01/21/22 0500 01/22/22 0600  Weight: 90.5 kg 91 kg 77.1 kg    Examination: Physical exam: General: Acutely ill-appearing male, lying on the bed HEENT: [place]/AT, eyes anicteric.  moist mucus membranes Neuro: Alert, awake following commands Chest: Coarse breath sounds, no wheezes or rhonchi Heart: Regular rate and rhythm, no murmurs or gallops Abdomen: Soft, nontender, nondistended, bowel sounds present Skin: No rash   Resolved Hospital Problem list   Acute metabolic encephalopathy in the setting of uremia and metabolic acidosis Circulatory shock now mostly septic/distributive Severe metabolic acidosis Hyponatremia/hypokalemia/hypocalcemia  Assessment & Plan:  Acute kidney injury due to ischemic ATN required CRRT Acute hypoxemic/hypercapnic respiratory failure due to inability to protect airway, aspiration pneumonia, volume overload DM2 with hyperglycemia and hypoglycemia Iron def anemia- stable  Appreciate nephrology follow-up Patient remained off CRRT for 2 days Patient made 3.1 L of urine in last 24 hours Metabolic acidosis has resolved, serum creatinine started trending down Patient was successfully extubated yesterday Continue titrate oxygen with O2 sat goal 92% Encourage incentive spirometry PT/OT evaluation Speech and swallow evaluation Continue IV antibiotics to complete 7 days therapy with Unasyn and doxycycline Patient is getting hypoglycemic after tube feed was stopped, will stop Levemir Continue sliding scale Monitor fingerstick with goal 140-180 H&H is stable, no signs of bleeding Monitor H&H   Best Practice (right click and "Reselect all SmartList Selections" daily)  Diet/type: NPO, speech and swallow evaluation DVT prophylaxis: Lovenox GI  prophylaxis: PPI Lines: N/A Foley:  Yes, and it is still needed Code Status:  full code Last date of multidisciplinary goals of care discussion [8/1: Updated patient's son over the phone]    Jacky Kindle, MD [place] for pager If no response to pager, please call [phone] until 7pm After 7pm, Please call E-link [phone]

## 2022-01-22 NOTE — Progress Notes (Signed)
Speech Language Pathology Treatment: Dysphagia  Patient Details Name: Keith Garrett MRN: [id number] DOB: January 09, 1966 Today's Date: 01/22/2022 Time: 1000-1015 SLP Time Calculation (min) (ACUTE ONLY): 15 min  Assessment / Plan / Recommendation Clinical Impression  Pt more alert today, able to self feed regular solids and thin liquids. No SLP f/u needed will sign off.   HPI HPI: 56 year old male patient .  Was found by family lying facedown on the floor on 7/28.  Pt found to have AKI and profound acidosis with hyperkalemia. Pt with DM and HTN at baseline. Apparently had been feeling poorly for about 2 days prior after eating lunch.  On 7/27 had been vomiting.  intubated for airway protection 7/28-8/02.      SLP Plan  All goals met      Recommendations for follow up therapy are one component of a multi-disciplinary discharge planning process, led by the attending physician.  Recommendations may be updated based on patient status, additional functional criteria and insurance authorization.    Recommendations  Diet recommendations: Regular;Thin liquid Liquids provided via: Cup;Straw Medication Administration: Whole meds with liquid Supervision: Patient able to self feed Postural Changes and/or Swallow Maneuvers: Seated upright 90 degrees                Assistance recommended at discharge: Intermittent Supervision/Assistance SLP Visit Diagnosis: Dysphagia, unspecified (R13.10) Plan: All goals met           Jencarlos Nicolson, Katherene Ponto  01/22/2022, 10:55 AM

## 2022-01-22 NOTE — Progress Notes (Signed)
Assessment/Plan: 56 year old hispanic male with DM, HTN now presenting with AKI and profound acidosis with hyperkalemia 1.Renal- last known crt was normal in 2021.  Difficult to know if this is acute, subacute  or chronic in the setting of longstanding DM and HTN. No urine available for review initially but will send today-   renal ultrasound  normal.  Given his hypotension and lactate of 9 major issue is hemodynamic at this point/ATN.   using CRRT to help correct acidosis and k-  started late 7/28-8/1.  Off CRRT 01/20/22 5am at the [facility] still great; will follow closely for now and hopefully there is e/o renal clearance. Cr appears to have stabilized. Promising with the great UOP.  Will follow peripherally and if downward trend then sign off. Needs f/u with [facility] a mth after d/c.  -Monitor Daily I/Os, Daily weight  -Maintain MAP>65 for optimal renal perfusion.  - Avoid nephrotoxic agents such as IV contrast, NSAIDs, and phosphate containing bowel preps (FLEETS)  2. Hypertension/volume  - hypotensive and dry initially, was taking ACE-I as a home med-  aggressive volume resuscitation and pressors-  net neg 4.9 liters during this hospitalization and UOP has been very brisk. 3.  Leukocytosis-  unclear if septic type picture-  now looking like PNA-  abx broadened to cefepime and vanc 4. Metabolic acidosis-  due to AKI and lactate-  bicarb amps and bicarb based fluids-  CRRT off on 8/1 5am 5. Hyperkalemia -  due to AKI and acidosis -  given short term treatments and also lokelma -  is better 6. Anemia  - is pretty severe and with patient being dry as well.  Points to the fact that this could be CKD-  supportive care for now -  conflicting data-   reported from 7.8-  transfuse as needed     Keith Garrett    Subjective:   UOP continues to be very good; CRRT off 8/1  Off all pressors and extubated 8/2. Following commands.  Objective Vital signs in last 24 hours: Vitals:   01/22/22 0654 01/22/22  0700 01/22/22 0800 01/22/22 0900  BP: (!) 151/86 (!) 163/77 (!) 154/79 (!) 165/84  Pulse: 76 75 80 76  Resp: (!) 21 16 15 16   Temp:   98.6 F (37 C)   TempSrc:   Oral   SpO2: 94% 94% 92% 93%  Weight:      Height:       Weight change: -13.9 kg  Intake/Output Summary (Last 24 hours) at 01/22/2022 1038 Last data filed at 01/22/2022 0800 Gross per 24 hour  Intake 865.57 ml  Output 3340 ml  Net -2474.43 ml     Labs: Basic Metabolic Panel: Recent Labs  Lab 01/21/22 0456 01/21/22 1601 01/22/22 0404  NA 138 143 145  K 3.8 4.1 3.9  CL 103 109 110  CO2 24 26 25   GLUCOSE 222* 86 84  BUN 45* 45* 45*  CREATININE 3.14* 3.19* 2.93*  CALCIUM 7.8* 8.2* 8.1*  PHOS 3.9 4.3 4.2   Liver Function Tests: Recent Labs  Lab 01/16/22 1713 01/17/22 1511 01/21/22 0456 01/21/22 1601 01/22/22 0404  AST 20  --   --   --   --   ALT 12  --   --   --   --   ALKPHOS 78  --   --   --   --   BILITOT 1.3*  --   --   --   --  PROT 6.4*  --   --   --   --   ALBUMIN 3.1*   < > 2.6* 2.6* 2.6*   < > = values in this interval not displayed.   No results for input(s): "LIPASE", "AMYLASE" in the last 168 hours. No results for input(s): "AMMONIA" in the last 168 hours. CBC: Recent Labs  Lab 01/16/22 1713 01/16/22 1742 01/18/22 0513 01/18/22 0538 01/19/22 0444 01/19/22 9417 01/20/22 0353 01/20/22 0557 01/21/22 0456 01/22/22 0404  WBC 13.9*   < > 13.7*  --  13.7*  --  16.5*  --  16.0* 15.5*  NEUTROABS 11.3*  --   --   --   --   --   --   --   --   --   HGB 8.6*   < > 7.8*   < > 7.3*   < > 9.0* 8.5* 8.6* 8.9*  HCT 27.6*   < > 22.8*   < > 21.1*   < > 25.9* 25.0* 26.0* 27.0*  MCV 98.2   < > 89.8  --  90.2  --  88.7  --  90.0 91.5  PLT 265   < > 159  --  176  --  175  --  201 240   < > = values in this interval not displayed.   Cardiac Enzymes: Recent Labs  Lab 01/16/22 1713  CKTOTAL 129   CBG: Recent Labs  Lab 01/21/22 2356 01/22/22 0048 01/22/22 0407 01/22/22 0620 01/22/22 0803   GLUCAP 59* 119* 88 82 88    Iron Studies:  No results for input(s): "IRON", "TIBC", "TRANSFERRIN", "FERRITIN" in the last 72 hours.  Studies/Results: No results found. Medications: Infusions:  sodium chloride Stopped (01/20/22 1023)   sodium chloride     sodium chloride     sodium chloride Stopped (01/21/22 1428)   ampicillin-sulbactam (UNASYN) IV 200 mL/hr at 01/22/22 0800   norepinephrine (LEVOPHED) Adult infusion Stopped (01/21/22 0716)    Scheduled Medications:  (feeding supplement) PROSource Plus  30 mL Oral Daily   bisacodyl  10 mg Rectal Once   Chlorhexidine Gluconate Cloth  6 each Topical Daily   dorzolamide-timolol  1 drop Left Eye BID   doxycycline  100 mg Oral Q12H   enoxaparin (LOVENOX) injection  30 mg Subcutaneous Q24H   insulin aspart  0-20 Units Subcutaneous Q4H   multivitamin  1 tablet Oral QHS   polyethylene glycol  17 g Oral Daily    have reviewed scheduled and prn medications.  Physical Exam: General: extubated and following commands Heart: RRR Lungs: CBS bilat Abdomen: slightly distended Extremities: tr edema Dialysis Access: right IJ vascath placed 7/28    01/22/2022,10:38 AM  LOS: 6 days

## 2022-01-22 NOTE — Progress Notes (Signed)
Removed pt's central line @ 8144, applied 20 min pressure, placed occlusive Vaseline dressing, 4x4 gauze, and secured w/tape ... Pt education given... pt is to remain flat x30 min.

## 2022-01-23 DIAGNOSIS — J9602 Acute respiratory failure with hypercapnia: Secondary | ICD-10-CM

## 2022-01-23 DIAGNOSIS — J9601 Acute respiratory failure with hypoxia: Secondary | ICD-10-CM

## 2022-01-23 LAB — GLUCOSE, CAPILLARY
Glucose-Capillary: 105 mg/dL — ABNORMAL HIGH (ref 70–99)
Glucose-Capillary: 105 mg/dL — ABNORMAL HIGH (ref 70–99)
Glucose-Capillary: 115 mg/dL — ABNORMAL HIGH (ref 70–99)
Glucose-Capillary: 179 mg/dL — ABNORMAL HIGH (ref 70–99)
Glucose-Capillary: 198 mg/dL — ABNORMAL HIGH (ref 70–99)
Glucose-Capillary: 205 mg/dL — ABNORMAL HIGH (ref 70–99)
Glucose-Capillary: 97 mg/dL (ref 70–99)

## 2022-01-23 LAB — RENAL FUNCTION PANEL
Albumin: 2.6 g/dL — ABNORMAL LOW (ref 3.5–5.0)
Anion gap: 11 (ref 5–15)
BUN: 39 mg/dL — ABNORMAL HIGH (ref 6–20)
CO2: 22 mmol/L (ref 22–32)
Calcium: 8.2 mg/dL — ABNORMAL LOW (ref 8.9–10.3)
Chloride: 106 mmol/L (ref 98–111)
Creatinine, Ser: 2.76 mg/dL — ABNORMAL HIGH (ref 0.61–1.24)
GFR, Estimated: 26 mL/min — ABNORMAL LOW (ref >60)
Glucose, Bld: 104 mg/dL — ABNORMAL HIGH (ref 70–99)
Phosphorus: 3.8 mg/dL (ref 2.5–4.6)
Potassium: 3.7 mmol/L (ref 3.5–5.1)
Sodium: 139 mmol/L (ref 135–145)

## 2022-01-23 MED ORDER — ADULT MULTIVITAMIN W/MINERALS CH
1.0000 | ORAL_TABLET | Freq: Every day | ORAL | Status: DC
  Administered 2022-01-23 – 2022-01-24 (×2): 1 via ORAL
  Filled 2022-01-23 (×2): qty 1

## 2022-01-23 NOTE — Progress Notes (Signed)
Physical Therapy Treatment Patient Details Name: Keith Garrett MRN: [id number] DOB: 02-Oct-1965 Today's Date: 01/23/2022   History of Present Illness Pt is a 56 y.o. male admitted 01/16/22 after being found down, unresponsive, on floor by family. Workup for AKI, profound acidosis, hyperkalemia. CRRT 7/28-8/1. ETT 7/28-8/2. PMH includes DM, HTN, L foot osteomyelitis (2021).    PT Comments    Pt received in supine, c/o groin soreness after catheter removed but agreeable to transfer and gait training with improved gait tolerance. Pt remains unsteady, needing up to minA with RW support and performs transfers with up to minA for steadying and safety cues needed for hand placement. Pt had not ordered lunch or dinner so menu obtained and PTA called dietary services for pt to place his order. Chair alarm on for safety at end of session. Pt continues to benefit from PT services to progress toward functional mobility goals. Disposition and DME updated below per discussion with supervising PT Jaclyn R.  Recommendations for follow up therapy are one component of a multi-disciplinary discharge planning process, led by the attending physician.  Recommendations may be updated based on patient status, additional functional criteria and insurance authorization.  Follow Up Recommendations  Outpatient PT (if transportation available)     Assistance Recommended at Discharge Intermittent Supervision/Assistance  Patient can return home with the following A little help with bathing/dressing/bathroom;Assistance with cooking/housework;Assist for transportation;Help with stairs or ramp for entrance   Equipment Recommendations  Rolling walker (2 wheels) (currently RW, may progress to cane or no AD)    Recommendations for Other Services       Precautions / Restrictions Precautions Precautions: Fall Restrictions Weight Bearing Restrictions: No     Mobility  Bed Mobility Overal bed mobility: Needs  Assistance Bed Mobility: Supine to Sit     Supine to sit: Min assist, HOB elevated     General bed mobility comments: minA and cues for cross-body reaching to bed rail to pull up on.    Transfers Overall transfer level: Needs assistance Equipment used: Rolling walker (2 wheels) Transfers: Sit to/from Stand Sit to Stand: Min assist           General transfer comment: from EOB and chair heights, cues for safe UE placement    Ambulation/Gait Ambulation/Gait assistance: Min guard, Min assist Gait Distance (Feet): 125 Feet Assistive device: Rolling walker (2 wheels) Gait Pattern/deviations: Step-to pattern, Step-through pattern, Decreased stride length, Trunk flexed Gait velocity: Decreased     General Gait Details: Slow guarded gait with low steps with cues for upright posture and looking forward, pt unsteady when lifting UE off RW handles. x1 LOB initially in room needing minA to correct     Balance Overall balance assessment: Needs assistance Sitting-balance support: No upper extremity supported Sitting balance-Leahy Scale: Fair     Standing balance support: Reliant on assistive device for balance Standing balance-Leahy Scale: Poor                              Cognition Arousal/Alertness: Awake/alert Behavior During Therapy: WFL for tasks assessed/performed Overall Cognitive Status: Within Functional Limits for tasks assessed                                          Exercises      General Comments General comments (skin integrity, edema, etc.): SpO2 >92% on  RA with exertion      Pertinent Vitals/Pain Pain Assessment Pain Assessment: Faces Faces Pain Scale: Hurts a little bit Pain Location: genital area after catheter removed Pain Descriptors / Indicators: Discomfort, Grimacing Pain Intervention(s): Monitored during session, Repositioned     PT Goals (current goals can now be found in the care plan section) Acute Rehab PT  Goals Patient Stated Goal: return home PT Goal Formulation: With patient Time For Goal Achievement: 02/05/22 Progress towards PT goals: Progressing toward goals    Frequency    Min 3X/week      PT Plan Discharge plan needs to be updated;Equipment recommendations need to be updated       AM-PAC PT "6 Clicks" Mobility   Outcome Measure  Help needed turning from your back to your side while in a flat bed without using bedrails?: A Little Help needed moving from lying on your back to sitting on the side of a flat bed without using bedrails?: A Little Help needed moving to and from a bed to a chair (including a wheelchair)?: A Little Help needed standing up from a chair using your arms (e.g., wheelchair or bedside chair)?: A Little Help needed to walk in hospital room?: A Little Help needed climbing 3-5 steps with a railing? : A Lot 6 Click Score: 17    End of Session Equipment Utilized During Treatment: Gait belt Activity Tolerance: Patient tolerated treatment well Patient left: in chair;with call bell/phone within reach;with chair alarm set;Other (comment) (pt assisted to dial dietary services since he did not order lunch or dinner yet as of 3pm) Nurse Communication: Mobility status;Other (comment) (pt had not ordered lunch/dinner as of 3 pm) PT Visit Diagnosis: Other abnormalities of gait and mobility (R26.89);Muscle weakness (generalized) (M62.81)     Time: 9201-0071 PT Time Calculation (min) (ACUTE ONLY): 36 min  Charges:  $Gait Training: 8-22 mins $Therapeutic Activity: 8-22 mins                     Ishan Sanroman P., PTA Acute Rehabilitation Services Secure Chat Preferred 9a-5:30pm Office: [place] 01/23/2022, 5:22 PM

## 2022-01-23 NOTE — Progress Notes (Signed)
PROGRESS NOTE        PATIENT DETAILS Name: Keith Garrett Age: 56 y.o. Sex: male Date of Birth: 11-22-1965 Admit Date: 01/16/2022 Admitting Physician Jacky Kindle, MD PCP:Rau, Tera Partridge, FNP  Brief Summary: Patient is a 56 y.o.  male with history of HTN, DM-2-who developed vomiting after eating at a local [place] restaurant-he was subsequently feeling poorly-family found him unresponsive-EMS was called-he was brought to the hospital where he was found to have hypovolemic shock with AKI-severe encephalopathy-requiring intubation to protect airway.  He was subsequently admitted to the ICU-started on CRRT-stabilized and subsequently transferred to [facility] service.  See below for further details.  Significant events: 7/28>> unresponsive-hypotensive-AKI-intubated-admitted to ICU. 8/02>> extubated 8/04>> transfer to [facility].  Significant studies: 7/28>> CT head: No acute intracranial abnormality 7/28>> CT C-spine: No fracture/subluxation 7/28>> renal ultrasound: No hydronephrosis  Significant microbiology data: 7/28>> COVID/influenza PCR: Negative 7/28>> blood culture: No growth  Procedures: 7/28-8/02>> ETT  Consults: PCCM, nephrology  Subjective: Lying comfortably in bed-denies any chest pain or shortness of breath.  No nausea, vomiting or diarrhea.  Objective: Vitals: Blood pressure (!) 143/69, pulse 78, temperature 98.3 F (36.8 C), temperature source Oral, resp. rate (!) 23, height 5\' 2"  (1.575 m), weight 77.1 kg, SpO2 95 %.   Exam: Gen Exam:Alert awake-not in any distress HEENT:atraumatic, normocephalic Chest: B/L clear to auscultation anteriorly CVS:S1S2 regular Abdomen:soft non tender, non distended Extremities:no edema Neurology: Non focal Skin: no rash  Pertinent Labs/Radiology:    Latest Ref Rng & Units 01/22/2022    4:04 AM 01/21/2022    4:56 AM 01/20/2022    5:57 AM  CBC  WBC 4.0 - 10.5 K/uL 15.5  16.0    Hemoglobin 13.0 - 17.0 g/dL 8.9  8.6   8.5   Hematocrit 39.0 - 52.0 % 27.0  26.0  25.0   Platelets 150 - 400 K/uL 240  201      Lab Results  Component Value Date   NA 139 01/23/2022   K 3.7 01/23/2022   CL 106 01/23/2022   CO2 22 01/23/2022      Assessment/Plan: Acute metabolic encephalopathy due to uremia/severe metabolic acidosis/hypovolemic shock: Resolved-awake and alert.  Acute hypoxic respiratory failure due to inability to protect airway in the setting of severe encephalopathy and aspiration PNA: Extubated on 8/2-currently on 2-3 L of oxygen.  Titrate off as tolerated.  Circulatory shock due to sepsis/hypovolemia (POA): Due to GI loss-aspiration PNA-shock physiology has resolved-BP stable.  Has completed a course of Unasyn-remains on doxycycline.  Acute kidney injury: Likely ischemic ATN-required CRRT-renal function now gradually improving.  Unclear if he has CKD at baseline.  Plan is to provide supportive care-avoid nephrotoxic agents-and follow kidney function.  Remove Foley catheter today.  Severe metabolic acidosis: Due to AKI-resolved.  Hyponatremia/hypokalemia/hypocalcemia: Resolved.  Normocytic anemia: Due to acute illness/AKI-no indications to transfuse at this point.  Follow CBC periodically.  Leukocytosis: Due to possible aspiration pneumonia-follow.  HTN: BP slowly creeping up-lisinopril/amlodipine remains on hold.  Follow and resume when able.  DM-2 (A1c 6.9): CBG stable on SSI-oral hypoglycemic agents remain on hold.  Recent Labs    01/23/22 0001 01/23/22 0411 01/23/22 0848  GLUCAP 105* 105* 179*   Obesity: Estimated body mass index is 31.09 kg/m as calculated from the following:   Height as of this encounter: 5\' 2"  (1.575 m).   Weight as of  this encounter: 77.1 kg.   Code status:   Code Status: Full Code   DVT Prophylaxis: enoxaparin (LOVENOX) injection 30 mg Start: 01/22/22 0830 SCDs Start: 01/16/22 1844   Family Communication: None at bedside   Disposition Plan: Status is:  Inpatient Remains inpatient appropriate because: Resolving AKI-needs to be mobilized-possible home in the next 24 to 48 hours.   Planned Discharge Destination:Home   Diet: Diet Order             Diet heart healthy/carb modified Room service appropriate? Yes; Fluid consistency: Thin  Diet effective now                     Antimicrobial agents: Anti-infectives (From admission, onward)    Start     Dose/Rate Route Frequency Ordered Stop   01/22/22 1000  doxycycline (VIBRA-TABS) tablet 100 mg        100 mg Oral Every 12 hours 01/21/22 2307 01/24/22 0959   01/21/22 0300  Ampicillin-Sulbactam (UNASYN) 3 g in sodium chloride 0.9 % 100 mL IVPB        3 g 200 mL/hr over 30 Minutes Intravenous Every 12 hours 01/20/22 1903 01/23/22 0425   01/18/22 2000  vancomycin (VANCOCIN) IVPB 1000 mg/200 mL premix  Status:  Discontinued        1,000 mg 200 mL/hr over 60 Minutes Intravenous Every 24 hours 01/17/22 1917 01/18/22 0821   01/18/22 1400  Ampicillin-Sulbactam (UNASYN) 3 g in sodium chloride 0.9 % 100 mL IVPB  Status:  Discontinued        3 g 200 mL/hr over 30 Minutes Intravenous Every 8 hours 01/18/22 0821 01/20/22 1903   01/18/22 1000  doxycycline (VIBRA-TABS) tablet 100 mg  Status:  Discontinued        100 mg Per Tube Every 12 hours 01/18/22 0821 01/21/22 2306   01/17/22 2200  ceFEPIme (MAXIPIME) 2 g in sodium chloride 0.9 % 100 mL IVPB  Status:  Discontinued        2 g 200 mL/hr over 30 Minutes Intravenous Every 12 hours 01/17/22 1917 01/18/22 0821   01/17/22 2000  vancomycin (VANCOREADY) IVPB 2000 mg/400 mL        2,000 mg 200 mL/hr over 120 Minutes Intravenous  Once 01/17/22 1917 01/17/22 2207   01/16/22 2200  Ampicillin-Sulbactam (UNASYN) 3 g in sodium chloride 0.9 % 100 mL IVPB  Status:  Discontinued        3 g 200 mL/hr over 30 Minutes Intravenous Every 8 hours 01/16/22 2035 01/18/22 0816   01/16/22 1930  Ampicillin-Sulbactam (UNASYN) 3 g in sodium chloride 0.9 % 100 mL IVPB   Status:  Discontinued        3 g 200 mL/hr over 30 Minutes Intravenous  Once 01/16/22 1921 01/16/22 2035        MEDICATIONS: Scheduled Meds:  bisacodyl  10 mg Rectal Once   Chlorhexidine Gluconate Cloth  6 each Topical Daily   dorzolamide-timolol  1 drop Left Eye BID   doxycycline  100 mg Oral Q12H   enoxaparin (LOVENOX) injection  30 mg Subcutaneous Q24H   insulin aspart  0-20 Units Subcutaneous Q4H   multivitamin with minerals  1 tablet Oral Daily   polyethylene glycol  17 g Oral Daily   Continuous Infusions:  sodium chloride Stopped (01/20/22 1023)   sodium chloride     sodium chloride     sodium chloride Stopped (01/21/22 1428)   PRN Meds:.Place/Maintain arterial line **AND** sodium chloride, Place/Maintain arterial  line **AND** sodium chloride, sodium chloride, docusate sodium, ondansetron (ZOFRAN) IV, mouth rinse, polyethylene glycol   I have personally reviewed following labs and imaging studies  LABORATORY DATA: CBC: Recent Labs  Lab 01/16/22 1713 01/16/22 1742 01/18/22 0513 01/18/22 0538 01/19/22 0444 01/19/22 5809 01/19/22 2357 01/20/22 0353 01/20/22 0557 01/21/22 0456 01/22/22 0404  WBC 13.9*   < > 13.7*  --  13.7*  --   --  16.5*  --  16.0* 15.5*  NEUTROABS 11.3*  --   --   --   --   --   --   --   --   --   --   HGB 8.6*   < > 7.8*   < > 7.3*   < > 8.5* 9.0* 8.5* 8.6* 8.9*  HCT 27.6*   < > 22.8*   < > 21.1*   < > 25.0* 25.9* 25.0* 26.0* 27.0*  MCV 98.2   < > 89.8  --  90.2  --   --  88.7  --  90.0 91.5  PLT 265   < > 159  --  176  --   --  175  --  201 240   < > = values in this interval not displayed.    Basic Metabolic Panel: Recent Labs  Lab 01/16/22 2152 01/16/22 2309 01/17/22 0345 01/17/22 0541 01/17/22 1511 01/18/22 0014 01/18/22 0513 01/18/22 0538 01/21/22 0456 01/21/22 1601 01/22/22 0404 01/22/22 1527 01/23/22 0407  NA 138   < > 139   < > 140   < > 133*   < > 138 143 145 143 139  K 6.2*   < > 5.7*   < > 3.4*   < > 4.2   < >  3.8 4.1 3.9 3.9 3.7  CL 104  --  101  --  112*  --  95*   < > 103 109 110 108 106  CO2 <7*  --  <7*  --  16*  --  23   < > 24 26 25 26 22   GLUCOSE 216*  --  258*  --  65*  --  267*   < > 222* 86 84 126* 104*  BUN 97*  --  89*  --  48*  --  47*   < > 45* 45* 45* 51* 39*  CREATININE 12.03*  --  10.77*  --  5.33*  --  4.93*   < > 3.14* 3.19* 2.93* 2.80* 2.76*  CALCIUM 7.3*  --  6.9*  --  5.1*  --  6.6*   < > 7.8* 8.2* 8.1* 8.3* 8.2*  MG 2.4  --  2.0  --  1.3*  --  1.9  --   --   --   --   --   --   PHOS 9.7*  --  9.0*  --  3.9  --  4.3  3.9   < > 3.9 4.3 4.2 3.9 3.8   < > = values in this interval not displayed.    GFR: Estimated Creatinine Clearance: 26.6 mL/min (A) (by C-G formula based on SCr of 2.76 mg/dL (H)).  Liver Function Tests: Recent Labs  Lab 01/16/22 1713 01/17/22 1511 01/21/22 0456 01/21/22 1601 01/22/22 0404 01/22/22 1527 01/23/22 0407  AST 20  --   --   --   --   --   --   ALT 12  --   --   --   --   --   --  ALKPHOS 78  --   --   --   --   --   --   BILITOT 1.3*  --   --   --   --   --   --   PROT 6.4*  --   --   --   --   --   --   ALBUMIN 3.1*   < > 2.6* 2.6* 2.6* 2.7* 2.6*   < > = values in this interval not displayed.   No results for input(s): "LIPASE", "AMYLASE" in the last 168 hours. No results for input(s): "AMMONIA" in the last 168 hours.  Coagulation Profile: Recent Labs  Lab 01/16/22 1713  INR 1.2    Cardiac Enzymes: Recent Labs  Lab 01/16/22 1713  CKTOTAL 129    BNP (last 3 results) No results for input(s): "PROBNP" in the last 8760 hours.  Lipid Profile: No results for input(s): "CHOL", "HDL", "LDLCALC", "TRIG", "CHOLHDL", "LDLDIRECT" in the last 72 hours.  Thyroid Function Tests: No results for input(s): "TSH", "T4TOTAL", "FREET4", "T3FREE", "THYROIDAB" in the last 72 hours.  Anemia Panel: No results for input(s): "VITAMINB12", "FOLATE", "FERRITIN", "TIBC", "IRON", "RETICCTPCT" in the last 72 hours.  Urine analysis:     Component Value Date/Time   COLORURINE STRAW (A) 01/18/2022 0953   APPEARANCEUR CLEAR 01/18/2022 0953   LABSPEC 1.006 01/18/2022 0953   PHURINE 7.0 01/18/2022 0953   GLUCOSEU >=500 (A) 01/18/2022 0953   HGBUR MODERATE (A) 01/18/2022 0953   BILIRUBINUR NEGATIVE 01/18/2022 0953   KETONESUR 5 (A) 01/18/2022 0953   PROTEINUR 30 (A) 01/18/2022 0953   UROBILINOGEN 0.2 09/21/2009 0047   NITRITE NEGATIVE 01/18/2022 0953   LEUKOCYTESUR NEGATIVE 01/18/2022 0953    Sepsis Labs: Lactic Acid, Venous    Component Value Date/Time   LATICACIDVEN 4.8 (HH) 01/18/2022 0513    MICROBIOLOGY: Recent Results (from the past 240 hour(s))  Resp Panel by RT-PCR (Flu A&B, Covid) Anterior Nasal Swab     Status: None   Collection Time: 01/16/22  4:58 PM   Specimen: Anterior Nasal Swab  Result Value Ref Range Status   SARS Coronavirus 2 by RT PCR NEGATIVE NEGATIVE Final    Comment: (NOTE) SARS-CoV-2 target nucleic acids are NOT DETECTED.  The SARS-CoV-2 RNA is generally detectable in upper respiratory specimens during the acute phase of infection. The lowest concentration of SARS-CoV-2 viral copies this assay can detect is 138 copies/mL. A negative result does not preclude SARS-Cov-2 infection and should not be used as the sole basis for treatment or other patient management decisions. A negative result may occur with  improper specimen collection/handling, submission of specimen other than nasopharyngeal swab, presence of viral mutation(s) within the areas targeted by this assay, and inadequate number of viral copies(<138 copies/mL). A negative result must be combined with clinical observations, patient history, and epidemiological information. The expected result is Negative.  Fact Sheet for Patients:  EntrepreneurPulse.com.au  Fact Sheet for Healthcare Providers:  IncredibleEmployment.be  This test is no t yet approved or cleared by the [place] FDA  and  has been authorized for detection and/or diagnosis of SARS-CoV-2 by FDA under an Emergency Use Authorization (EUA). This EUA will remain  in effect (meaning this test can be used) for the duration of the COVID-19 declaration under Section 564(b)(1) of the Act, 21 U.S.C.section 360bbb-3(b)(1), unless the authorization is terminated  or revoked sooner.       Influenza A by PCR NEGATIVE NEGATIVE Final   Influenza B by PCR  NEGATIVE NEGATIVE Final    Comment: (NOTE) The Xpert Xpress SARS-CoV-2/FLU/RSV plus assay is intended as an aid in the diagnosis of influenza from Nasopharyngeal swab specimens and should not be used as a sole basis for treatment. Nasal washings and aspirates are unacceptable for Xpert Xpress SARS-CoV-2/FLU/RSV testing.  Fact Sheet for Patients: EntrepreneurPulse.com.au  Fact Sheet for Healthcare Providers: IncredibleEmployment.be  This test is not yet approved or cleared by the [place] FDA and has been authorized for detection and/or diagnosis of SARS-CoV-2 by FDA under an Emergency Use Authorization (EUA). This EUA will remain in effect (meaning this test can be used) for the duration of the COVID-19 declaration under Section 564(b)(1) of the Act, 21 U.S.C. section 360bbb-3(b)(1), unless the authorization is terminated or revoked.  Performed at [facility], [place] [address], [place], [place] [address]   MRSA Next Gen by PCR, Nasal     Status: None   Collection Time: 01/16/22  8:18 PM   Specimen: Peripheral; Nasal Swab  Result Value Ref Range Status   MRSA by PCR Next Gen NOT DETECTED NOT DETECTED Final    Comment: (NOTE) The GeneXpert MRSA Assay (FDA approved for NASAL specimens only), is one component of a comprehensive MRSA colonization surveillance program. It is not intended to diagnose MRSA infection nor to guide or monitor treatment for MRSA infections. Test performance is not FDA approved in  patients less than 26 years old. Performed at [facility], [place] [address], [place], [place] [address]   Culture, blood (Routine X 2) w Reflex to ID Panel     Status: None   Collection Time: 01/16/22  9:52 PM   Specimen: BLOOD LEFT HAND  Result Value Ref Range Status   Specimen Description BLOOD LEFT HAND  Final   Special Requests AEROBIC BOTTLE ONLY Blood Culture adequate volume  Final   Culture   Final    NO GROWTH 5 DAYS Performed at [facility], [place] [address] [place], [place] [address]    Report Status 01/21/2022 FINAL  Final  Culture, blood (Routine X 2) w Reflex to ID Panel     Status: None   Collection Time: 01/16/22  9:52 PM   Specimen: BLOOD LEFT HAND  Result Value Ref Range Status   Specimen Description BLOOD LEFT HAND  Final   Special Requests AEROBIC BOTTLE ONLY Blood Culture adequate volume  Final   Culture   Final    NO GROWTH 5 DAYS Performed at [facility], [place] [address], [place],  [address]    Report Status 01/21/2022 FINAL  Final    RADIOLOGY STUDIES/RESULTS: No results found.   LOS: 7 days   Oren Binet, MD  [facility]    To contact the attending provider between 7A-7P or the covering provider during after hours 7P-7A, please log into the web site [email or web address] and access using universal [place] password for that web site. If you do not have the password, please call the hospital operator.  01/23/2022, 10:39 AM

## 2022-01-23 NOTE — Progress Notes (Signed)
Nutrition Follow-up  DOCUMENTATION CODES:   Obesity unspecified  INTERVENTION:   HS snack daily per patient request. Change vitamin to MVI with minerals daily. D/C Prosource Plus supplement.  NUTRITION DIAGNOSIS:   Inadequate oral intake related to inability to eat as evidenced by NPO status.  Ongoing, but now on a heart healthy diet.  GOAL:   Patient will meet greater than or equal to 90% of their needs  Progressing   MONITOR:   Vent status, Labs, I & O's, TF tolerance  REASON FOR ASSESSMENT:   Consult Assessment of nutrition requirement/status  ASSESSMENT:   56 y.o. male presented to the ED after being found down. Was intubated on arrival due to inability to protect his airway. PMH includes T2DM, CKD, and HTN. Pt admitted with acute metabolic encephalopathy, AKI on CKD, and metabolic acidosis.  CRRT stopped 8/1. Nephrology following. Renal function improved with 2015 ml UOP x 24 hours. Patient was extubated 8/2. Currently on 3 L oxygen via nasal cannula. Patient reports no nutrition problems PTA, weight stable and good PO intake at home. Since diet advanced to heart healthy yesterday, he has had a good appetite and has been eating almost all of the food on his meal trays. He would like to receive a bedtime snack daily, RD to order.   Current diet: carb modified, heart healthy with Prosource Plus 30 ml PO once daily. Meal intakes not recorded.  Labs reviewed. CBG: [id number]  Medications reviewed and include Novolog, Rena-vit, Miralax.  Admission weight 96.8 kg Currently 77.1 kg (8/3) Decrease in weight is related to negative fluid balance. Net IO Since Admission: -5,849.72 mL [01/23/22 1032]   NUTRITION - FOCUSED PHYSICAL EXAM:  Flowsheet Row Most Recent Value  Orbital Region No depletion  Upper Arm Region No depletion  Thoracic and Lumbar Region No depletion  Buccal Region No depletion  Temple Region No depletion  Clavicle Bone Region No depletion   Clavicle and Acromion Bone Region No depletion  Scapular Bone Region No depletion  Dorsal Hand No depletion  Patellar Region No depletion  Anterior Thigh Region No depletion  Posterior Calf Region No depletion  Edema (RD Assessment) Mild  Hair Reviewed  Eyes Reviewed  Mouth Reviewed  Skin Reviewed  Nails Reviewed       Diet Order:   Diet Order             Diet heart healthy/carb modified Room service appropriate? Yes; Fluid consistency: Thin  Diet effective now                   EDUCATION NEEDS:   Not appropriate for education at this time  Skin:  Skin Assessment: Reviewed RN Assessment  Last BM:  7/31 type 7  Height:   Ht Readings from Last 1 Encounters:  01/20/22 5\' 2"  (1.575 m)    Weight:   Wt Readings from Last 1 Encounters:  01/22/22 77.1 kg    Ideal Body Weight:  53.6 kg  BMI:  Body mass index is 31.09 kg/m.  Estimated Nutritional Needs:   Kcal:  2100-2300  Protein:  95-105 gm  Fluid:  >/= 2 L   Lucas Mallow RD, LDN, CNSC Please refer to Amion for contact information.

## 2022-01-23 NOTE — Plan of Care (Signed)
  Problem: Education: Goal: Ability to describe self-care measures that may prevent or decrease complications (Diabetes Survival Skills Education) will improve Outcome: Progressing Goal: Individualized Educational Video(s) Outcome: Progressing   Problem: Coping: Goal: Ability to adjust to condition or change in health will improve Outcome: Progressing   Problem: Fluid Volume: Goal: Ability to maintain a balanced intake and output will improve Outcome: Progressing   Problem: Health Behavior/Discharge Planning: Goal: Ability to identify and utilize available resources and services will improve Outcome: Progressing Goal: Ability to manage health-related needs will improve Outcome: Progressing   Problem: Metabolic: Goal: Ability to maintain appropriate glucose levels will improve Outcome: Progressing   Problem: Nutritional: Goal: Maintenance of adequate nutrition will improve Outcome: Progressing Goal: Progress toward achieving an optimal weight will improve Outcome: Progressing   Problem: Skin Integrity: Goal: Risk for impaired skin integrity will decrease Outcome: Progressing   Problem: Tissue Perfusion: Goal: Adequacy of tissue perfusion will improve Outcome: Progressing   Problem: Education: Goal: Knowledge of General Education information will improve Description: Including pain rating scale, medication(s)/side effects and non-pharmacologic comfort measures Outcome: Progressing   Problem: Health Behavior/Discharge Planning: Goal: Ability to manage health-related needs will improve Outcome: Progressing   Problem: Clinical Measurements: Goal: Ability to maintain clinical measurements within normal limits will improve Outcome: Progressing Goal: Will remain free from infection Outcome: Progressing Goal: Diagnostic test results will improve Outcome: Progressing Goal: Respiratory complications will improve Outcome: Progressing Goal: Cardiovascular complication will  be avoided Outcome: Progressing   Problem: Activity: Goal: Risk for activity intolerance will decrease Outcome: Progressing   Problem: Nutrition: Goal: Adequate nutrition will be maintained Outcome: Progressing   Problem: Coping: Goal: Level of anxiety will decrease Outcome: Progressing   Problem: Elimination: Goal: Will not experience complications related to bowel motility Outcome: Progressing Goal: Will not experience complications related to urinary retention Outcome: Progressing   Problem: Pain Managment: Goal: General experience of comfort will improve Outcome: Progressing   Problem: Safety: Goal: Ability to remain free from injury will improve Outcome: Progressing   Problem: Skin Integrity: Goal: Risk for impaired skin integrity will decrease Outcome: Progressing   Problem: Safety: Goal: Non-violent Restraint(s) Outcome: Progressing   

## 2022-01-24 LAB — RENAL FUNCTION PANEL
Albumin: 2.8 g/dL — ABNORMAL LOW (ref 3.5–5.0)
Anion gap: 10 (ref 5–15)
BUN: 51 mg/dL — ABNORMAL HIGH (ref 6–20)
CO2: 20 mmol/L — ABNORMAL LOW (ref 22–32)
Calcium: 8.1 mg/dL — ABNORMAL LOW (ref 8.9–10.3)
Chloride: 107 mmol/L (ref 98–111)
Creatinine, Ser: 2.4 mg/dL — ABNORMAL HIGH (ref 0.61–1.24)
GFR, Estimated: 31 mL/min — ABNORMAL LOW (ref >60)
Glucose, Bld: 93 mg/dL (ref 70–99)
Phosphorus: 4.1 mg/dL (ref 2.5–4.6)
Potassium: 3.4 mmol/L — ABNORMAL LOW (ref 3.5–5.1)
Sodium: 137 mmol/L (ref 135–145)

## 2022-01-24 LAB — GLUCOSE, CAPILLARY
Glucose-Capillary: 100 mg/dL — ABNORMAL HIGH (ref 70–99)
Glucose-Capillary: 100 mg/dL — ABNORMAL HIGH (ref 70–99)
Glucose-Capillary: 156 mg/dL — ABNORMAL HIGH (ref 70–99)

## 2022-01-24 MED ORDER — LINAGLIPTIN 5 MG PO TABS: 5.0000 mg | ORAL_TABLET | Freq: Every day | ORAL | 2 refills | Status: DC

## 2022-01-24 MED ORDER — LINAGLIPTIN 5 MG PO TABS
5.0000 mg | ORAL_TABLET | Freq: Every day | ORAL | Status: DC
Start: 2022-01-24 — End: 2022-01-24
  Filled 2022-01-24: qty 1

## 2022-01-24 MED ORDER — AMLODIPINE BESYLATE 10 MG PO TABS: 5.0000 mg | ORAL_TABLET | Freq: Every day | ORAL | 2 refills | Status: DC

## 2022-01-24 MED ORDER — POTASSIUM CHLORIDE CRYS ER 20 MEQ PO TBCR
40.0000 meq | EXTENDED_RELEASE_TABLET | Freq: Once | ORAL | Status: AC
  Administered 2022-01-24: 40 meq via ORAL
  Filled 2022-01-24: qty 2

## 2022-01-24 NOTE — Discharge Summary (Signed)
PATIENT DETAILS Name: Keith Garrett Age: 56 y.o. Sex: male Date of Birth: [birth year] MRN: [id number]. Admitting Physician: Cheri Fowler, MD PCP:Rau, Morrie Sheldon, FNP  Admit Date: 01/16/2022 Discharge date: 01/24/2022  Recommendations for Outpatient Follow-up:  Follow up with PCP in 1-2 weeks Please obtain CMP/CBC in one week Lisinopril/metformin/glipizide held-resume when renal function allows.  Admitted From:  Home  Disposition: Outpatient PT   Discharge Condition: good  CODE STATUS:   Code Status: Full Code   Diet recommendation:  Diet Order             Diet - low sodium heart healthy           Diet heart healthy/carb modified Room service appropriate? Yes; Fluid consistency: Thin  Diet effective now                    Brief Summary: Patient is a 56 y.o.  male with history of HTN, DM-2-who developed vomiting after eating at a local [place] restaurant-he was subsequently feeling poorly-family found him unresponsive-EMS was called-he was brought to the hospital where he was found to have hypovolemic shock with AKI-severe encephalopathy-requiring intubation to protect airway.  He was subsequently admitted to the ICU-started on CRRT-stabilized and subsequently transferred to [facility] service.  See below for further details.   Significant events: 7/28>> unresponsive-hypotensive-AKI-intubated-admitted to ICU. 8/02>> extubated 8/04>> transfer to [facility].   Significant studies: 7/28>> CT head: No acute intracranial abnormality 7/28>> CT C-spine: No fracture/subluxation 7/28>> renal ultrasound: No hydronephrosis   Significant microbiology data: 7/28>> COVID/influenza PCR: Negative 7/28>> blood culture: No growth   Procedures: 7/28-8/02>> ETT   Consults: PCCM, nephrology    Brief Hospital Course: Acute metabolic encephalopathy due to uremia/severe metabolic acidosis/hypovolemic shock: Resolved-awake and alert.   Acute hypoxic respiratory failure due to inability to  protect airway in the setting of severe encephalopathy and aspiration PNA: Extubated on 8/2-titrated to room air.    Circulatory shock due to sepsis/hypovolemia (POA): Due to GI loss-aspiration PNA-shock physiology has resolved-BP stable.  Has completed a course of Unasyn and doxycycline.     Acute kidney injury: Likely ischemic ATN-required CRRT-renal function is gradually improving on its own-creatinine continues to downtrend with just supportive care-he is no longer on IVF.  Continue to avoid nephrotoxic agents.  Discussed with patient at length-he has a PCP-and I have recommended that he get a repeat electrolyte panel in 1 week.  Foley catheter was removed yesterday-and he is voiding on his own.  Severe metabolic acidosis: Due to AKI-resolved.   Hyponatremia/hypokalemia/hypocalcemia: Better-we will continue to replete K today.   Normocytic anemia: Due to acute illness/AKI-no indications to transfuse at this point.  Follow CBC periodically.   Leukocytosis: Due to possible aspiration pneumonia-follow.   HTN: BP slowly creeping up-we will resume amlodipine and continue to hold lisinopril.     DM-2 (A1c 6.9): CBG stable on SSI-we will continue to hold glipizide/metformin-and transition to Tradjenta on discharge.  Follow with PCP for further optimization.  Recent Labs    01/23/22 2325 01/24/22 0350 01/24/22 0829  GLUCAP 97 100* 100*     Obesity: Estimated body mass index is 31.09 kg/m as calculated from the following:   Height as of this encounter: 5\' 2"  (1.575 m).   Weight as of this encounter: 77.1 kg.     Nutrition Status: Nutrition Problem: Inadequate oral intake Etiology: inability to eat Signs/Symptoms: NPO status Interventions: Tube feeding    Discharge Diagnoses:  Principal Problem:   Acute metabolic encephalopathy  Active Problems:   Metabolic acidosis   Acute respiratory failure (HCC)   Acute renal failure superimposed on stage 2 chronic kidney disease (HCC)    Hypovolemia   Normocytic anemia   Bilateral cataracts   Hyperkalemia   Discharge Instructions:  Activity:  As tolerated  Discharge Instructions     Call MD for:  extreme fatigue   Complete by: As directed    Call MD for:  persistant dizziness or light-headedness   Complete by: As directed    Call MD for:  persistant nausea and vomiting   Complete by: As directed    Call MD for:  severe uncontrolled pain   Complete by: As directed    Diet - low sodium heart healthy   Complete by: As directed    Discharge instructions   Complete by: As directed    Follow with Primary MD  Salvadore Farber, FNP in 1-2 weeks  Please ask your primary care practitioner to repeat a chemistry panel in 1 week.  We have held your blood pressure medication called lisinopril-please do not take it until your kidney function improves further.  Please resume it only after talking with your primary care practitioner.  Due to kidney failure-we have temporarily held metformin and glipizide-please only resume these medications after you talk to your primary care practitioner.  In the interim-we have put you on a different medication.  Do not take medications like Aleve, Advil, ibuprofen, Motrin until your kidney failure has completely resolved.   Please get a complete blood count and chemistry panel checked by your Primary MD at your next visit, and again as instructed by your Primary MD.  Get Medicines reviewed and adjusted: Please take all your medications with you for your next visit with your Primary MD  Laboratory/radiological data: Please request your Primary MD to go over all hospital tests and procedure/radiological results at the follow up, please ask your Primary MD to get all Hospital records sent to his/her office.  In some cases, they will be blood work, cultures and biopsy results pending at the time of your discharge. Please request that your primary care M.D. follows up on these results.  Also  Note the following: If you experience worsening of your admission symptoms, develop shortness of breath, life threatening emergency, suicidal or homicidal thoughts you must seek medical attention immediately by calling 911 or calling your MD immediately  if symptoms less severe.  You must read complete instructions/literature along with all the possible adverse reactions/side effects for all the Medicines you take and that have been prescribed to you. Take any new Medicines after you have completely understood and accpet all the possible adverse reactions/side effects.   Do not drive when taking Pain medications or sleeping medications (Benzodaizepines)  Do not take more than prescribed Pain, Sleep and Anxiety Medications. It is not advisable to combine anxiety,sleep and pain medications without talking with your primary care practitioner  Special Instructions: If you have smoked or chewed Tobacco  in the last 2 yrs please stop smoking, stop any regular Alcohol  and or any Recreational drug use.  Wear Seat belts while driving.  Please note: You were cared for by a hospitalist during your hospital stay. Once you are discharged, your primary care physician will handle any further medical issues. Please note that NO REFILLS for any discharge medications will be authorized once you are discharged, as it is imperative that you return to your primary care physician (or establish a relationship with  a primary care physician if you do not have one) for your post hospital discharge needs so that they can reassess your need for medications and monitor your lab values.   Increase activity slowly   Complete by: As directed       Allergies as of 01/24/2022   No Known Allergies      Medication List     STOP taking these medications    cephALEXin 500 MG capsule Commonly known as: Keflex   ciprofloxacin 500 MG tablet Commonly known as: CIPRO   glipiZIDE 10 MG tablet Commonly known as: GLUCOTROL    lidocaine 5 % Commonly known as: Lidoderm   lisinopril 40 MG tablet Commonly known as: ZESTRIL   metFORMIN 1000 MG tablet Commonly known as: GLUCOPHAGE   methocarbamol 500 MG tablet Commonly known as: ROBAXIN       TAKE these medications    amLODipine 10 MG tablet Commonly known as: NORVASC Take 0.5 tablets (5 mg total) by mouth daily. What changed: how much to take   atorvastatin 40 MG tablet Commonly known as: LIPITOR Take 1 tablet (40 mg total) by mouth daily.   dorzolamide-timolol 22.3-6.8 MG/ML ophthalmic solution Commonly known as: COSOPT Place 1 drop into the left eye 2 (two) times daily.   linagliptin 5 MG Tabs tablet Commonly known as: TRADJENTA Take 1 tablet (5 mg total) by mouth daily.        Follow-up Information     Elsie Stain, MD. Call.   Specialty: Pulmonary Disease Why: TIME : 3:00 PM DATE : AUGUST 10 , 2023 Contact information: [address] [address] [phone]                No Known Allergies   Other Procedures/Studies: DG Chest Port 1 View  Result Date: 01/19/2022 CLINICAL DATA:  Endotracheal tube, respiratory failure.  CHF EXAM: PORTABLE CHEST 1 VIEW COMPARISON:  Radiograph January 18, 2022. FINDINGS: Endotracheal tube with tip projecting over the proximal thoracic trachea. Nasogastric tube courses below the diaphragm with tip obscured by collimation. Right IJ CVC with tip projecting over the SVC. Additional EKG leads project over the chest. Improved aeration of the lungs with decreased left-greater-than-right basilar predominant interstitial opacities and patchy airspace opacities. The heart size and mediastinal contours are unchanged. No acute osseous abnormality. IMPRESSION: Improved aeration of the lungs with decreased left-greater-than-right basilar predominant interstitial opacities and patchy airspace opacities. Electronically Signed   By: Dahlia Bailiff M.D.   On: 01/19/2022 08:10   DG  Chest Port 1 View  Result Date: 01/18/2022 CLINICAL DATA:  Metabolic encephalopathy with ventilator dependent respiratory failure. EXAM: PORTABLE CHEST 1 VIEW COMPARISON:  The earlier study today at 12:32 a.m. FINDINGS: 5:16 a.m. ETT tip is 4.3 cm from the carina. NGT passes well into the stomach but neither the side-hole or tip are included in the film. Right IJ double-lumen catheter terminates in the distal SVC as before. There is mild cardiomegaly increased central vascular prominence, mild increased central edema. There is worsening patchy dense consolidation in the left mid and lower lung field, increased small left-greater-than-right pleural effusions and unchanged appearance of right lung mid perihilar consolidation. Findings are concerning for worsening pneumonia on the left with stable airspace disease on the right. The upper lung fields are generally clear. In all other respects no further changes. IMPRESSION: Increasing airspace disease and pleural effusion on the left. No interval change in right mid perihilar consolidation. Increasing central vascular prominence and  mild central edema. Electronically Signed   By: Telford Nab M.D.   On: 01/18/2022 07:26   DG CHEST PORT 1 VIEW  Result Date: 01/18/2022 CLINICAL DATA:  Shortness of breath. EXAM: PORTABLE CHEST 1 VIEW COMPARISON:  Earlier radiograph dated 01/17/2022. FINDINGS: Endotracheal tube remains above the carina. Right IJ central venous line in similar position. Enteric tube extends below the diaphragm with tip beyond the inferior margin of the image. Shallow inspiration with bibasilar atelectasis versus infiltrate, left greater right. Overall interval improvement of the aeration of the lungs compared to prior radiograph. No pneumothorax. Possible trace pleural effusions. Stable cardiomediastinal silhouette. No acute osseous pathology. IMPRESSION: Shallow inspiration with bibasilar atelectasis versus infiltrate, left greater right. Overall  interval improvement of the aeration of the lungs compared to prior radiograph. Electronically Signed   By: Anner Crete M.D.   On: 01/18/2022 00:43   DG CHEST PORT 1 VIEW  Result Date: 01/17/2022 CLINICAL DATA:  Atelectasis. EXAM: PORTABLE CHEST 1 VIEW COMPARISON:  January 17, 2022 FINDINGS: The ETT is in good position. The NG tube terminates below today's film. The right central line is stable. No pneumothorax. The right lung is clear. Haziness over the left mid lower chest is increased. No other acute abnormalities. IMPRESSION: 1. Support apparatus as above. 2. Increased haziness in the left mid lower lung is favored to represent layering effusion with underlying atelectasis. Developing infiltrate is possible as well. Recommend clinical correlation and attention on short-term follow-up. Electronically Signed   By: Dorise Bullion III M.D.   On: 01/17/2022 16:49   ECHOCARDIOGRAM COMPLETE  Result Date: 01/17/2022    ECHOCARDIOGRAM REPORT   Patient Name:   IZIK BINGMAN Date of Exam: 01/17/2022 Medical Rec #:  [id number]        Height:       62.0 in Accession #:    [id number]       Weight:       213.4 lb Date of Birth:  05/20/1966        BSA:          1.965 m Patient Age:    60 years         BP:           129/62 mmHg Patient Gender: M                HR:           99 bpm. Exam Location:  Inpatient Procedure: 2D Echo, Cardiac Doppler, Color Doppler and Intracardiac            Opacification Agent Indications:    I42.9 Cardiomyopathy (unspecified)  History:        Patient has prior history of Echocardiogram examinations, most                 recent 04/16/2020. TIA; Signs/Symptoms:Dyspnea.  Sonographer:    Roseanna Rainbow RDCS Referring Phys: [id number] Candee Furbish  Sonographer Comments: Technically difficult study due to poor echo windows, patient is morbidly obese and echo performed with patient supine and on artificial respirator. Image acquisition challenging due to patient body habitus. IMPRESSIONS  1. Left  ventricular ejection fraction, by estimation, is 65 to 70%. The left ventricle has hyperdynamic function. The left ventricle has no regional wall motion abnormalities. Left ventricular diastolic parameters were normal.  2. Right ventricular systolic function is hyperdynamic. The right ventricular size is normal. There is mildly elevated pulmonary artery systolic pressure. The estimated right ventricular systolic pressure is 42.2  mmHg.  3. Left atrial size was moderately dilated.  4. The mitral valve is normal in structure. No evidence of mitral valve regurgitation.  5. The aortic valve is tricuspid. There is mild calcification of the aortic valve. There is mild thickening of the aortic valve. Aortic valve regurgitation is not visualized. Aortic valve sclerosis/calcification is present, without any evidence of aortic stenosis.  6. The inferior vena cava is dilated in size with <50% respiratory variability, suggesting right atrial pressure of 15 mmHg. Comparison(s): No significant change from prior study. Prior images reviewed side by side. FINDINGS  Left Ventricle: Left ventricular ejection fraction, by estimation, is 65 to 70%. The left ventricle has hyperdynamic function. The left ventricle has no regional wall motion abnormalities. Definity contrast agent was given IV to delineate the left ventricular endocardial borders. The left ventricular internal cavity size was normal in size. There is borderline concentric left ventricular hypertrophy. Left ventricular diastolic parameters were normal. Normal left ventricular filling pressure. Right Ventricle: The right ventricular size is normal. No increase in right ventricular wall thickness. Right ventricular systolic function is hyperdynamic. There is mildly elevated pulmonary artery systolic pressure. The tricuspid regurgitant velocity is 2.61 m/s, and with an assumed right atrial pressure of 15 mmHg, the estimated right ventricular systolic pressure is 62.5 mmHg.  Left Atrium: Left atrial size was moderately dilated. Right Atrium: Right atrial size was normal in size. Pericardium: There is no evidence of pericardial effusion. Mitral Valve: The mitral valve is normal in structure. Mild to moderate mitral annular calcification. No evidence of mitral valve regurgitation. Tricuspid Valve: The tricuspid valve is normal in structure. Tricuspid valve regurgitation is trivial. Aortic Valve: The aortic valve is tricuspid. There is mild calcification of the aortic valve. There is mild thickening of the aortic valve. Aortic valve regurgitation is not visualized. Aortic valve sclerosis/calcification is present, without any evidence of aortic stenosis. Aortic valve mean gradient measures 13.6 mmHg. Aortic valve peak gradient measures 24.6 mmHg. Aortic valve area, by VTI measures 2.10 cm. Pulmonic Valve: The pulmonic valve was grossly normal. Pulmonic valve regurgitation is not visualized. Aorta: The aortic root and ascending aorta are structurally normal, with no evidence of dilitation. Venous: The inferior vena cava is dilated in size with less than 50% respiratory variability, suggesting right atrial pressure of 15 mmHg. IAS/Shunts: No atrial level shunt detected by color flow Doppler.  LEFT VENTRICLE PLAX 2D LVIDd:         5.00 cm      Diastology LVIDs:         2.80 cm      LV e' medial:    11.60 cm/s LV PW:         1.20 cm      LV E/e' medial:  7.3 LV IVS:        1.20 cm      LV e' lateral:   9.14 cm/s LVOT diam:     1.90 cm      LV E/e' lateral: 9.2 LV SV:         81 LV SV Index:   41 LVOT Area:     2.84 cm  LV Volumes (MOD) LV vol d, MOD A2C: 94.7 ml LV vol d, MOD A4C: 118.0 ml LV vol s, MOD A2C: 33.7 ml LV vol s, MOD A4C: 41.1 ml LV SV MOD A2C:     61.0 ml LV SV MOD A4C:     118.0 ml LV SV MOD BP:  71.0 ml RIGHT VENTRICLE             IVC RV S prime:     22.10 cm/s  IVC diam: 2.80 cm TAPSE (M-mode): 2.4 cm LEFT ATRIUM             Index        RIGHT ATRIUM           Index LA  diam:        4.70 cm 2.39 cm/m   RA Area:     11.20 cm LA Vol (A2C):   34.0 ml 17.30 ml/m  RA Volume:   19.80 ml  10.08 ml/m LA Vol (A4C):   45.3 ml 23.05 ml/m LA Biplane Vol: 42.2 ml 21.47 ml/m  AORTIC VALVE AV Area (Vmax):    1.98 cm AV Area (Vmean):   2.00 cm AV Area (VTI):     2.10 cm AV Vmax:           248.00 cm/s AV Vmean:          170.400 cm/s AV VTI:            0.385 m AV Peak Grad:      24.6 mmHg AV Mean Grad:      13.6 mmHg LVOT Vmax:         173.00 cm/s LVOT Vmean:        120.000 cm/s LVOT VTI:          0.286 m LVOT/AV VTI ratio: 0.74  AORTA Ao Root diam: 3.10 cm Ao Asc diam:  3.20 cm MITRAL VALVE               TRICUSPID VALVE MV Area (PHT): 4.37 cm    TR Peak grad:   27.2 mmHg MV Decel Time: 174 msec    TR Vmax:        261.00 cm/s MV E velocity: 84.37 cm/s MV A velocity: 90.80 cm/s  SHUNTS MV E/A ratio:  0.93        Systemic VTI:  0.29 m                            Systemic Diam: 1.90 cm Dani Gobble Croitoru MD Electronically signed by Sanda Klein MD Signature Date/Time: 01/17/2022/12:52:13 PM    Final    DG Chest Port 1 View  Result Date: 01/17/2022 CLINICAL DATA:  56 year old male with history of aspiration pneumonia. EXAM: PORTABLE CHEST 1 VIEW COMPARISON:  Chest x-ray 01/16/2022. FINDINGS: An endotracheal tube is in place with tip 5.1 cm above the carina. There is a right-sided internal jugular central venous catheter with tip terminating in the distal superior vena cava. A nasogastric tube is seen extending into the stomach, however, the tip of the nasogastric tube extends below the lower margin of the image. Lung volumes are low. Opacity at the left base which may reflect atelectasis and/or consolidation, worsened compared to the prior study. Small left pleural effusion. Right lung is clear. No right pleural effusion. No pneumothorax. No evidence of pulmonary edema. Heart size is normal. Upper mediastinal contours are within normal limits. Atherosclerotic calcifications are noted in the  thoracic aorta. IMPRESSION: 1. Support apparatus, as above. 2. Atelectasis and/or consolidation in the left lower lobe with small left pleural effusion. Electronically Signed   By: Vinnie Langton M.D.   On: 01/17/2022 07:22   US RENAL  Result Date: 01/16/2022 CLINICAL DATA:  Acute kidney injury EXAM: RENAL / URINARY  TRACT ULTRASOUND COMPLETE COMPARISON:  None Available. FINDINGS: Right Kidney: Renal measurements: 12.0 x 5.7 x 5.7 cm = volume: 205 mL. Echogenicity within normal limits. No mass or hydronephrosis visualized. Left Kidney: Renal measurements: 13.9 x 6.7 x 6.6 cm = volume: 320 mL. Echogenicity within normal limits. No mass or hydronephrosis visualized. Bladder: Decompressed by Foley catheter. Other: None. IMPRESSION: 1. Normal appearance of kidneys. 2. Electronically Signed   By: Ronney Asters M.D.   On: 01/16/2022 22:56   DG CHEST PORT 1 VIEW  Result Date: 01/16/2022 CLINICAL DATA:  Central line placement. EXAM: PORTABLE CHEST 1 VIEW COMPARISON:  Earlier radiograph dated 01/16/2022. FINDINGS: Interval placement of a right IJ central venous line with tip over central SVC. No pneumothorax. Endotracheal tube remains above the carina. Enteric tube extends below the diaphragm with tip beyond the inferior margin of the image. No interval change in bilateral pulmonary streaky densities since the earlier radiograph. Stable cardiomediastinal silhouette. No acute osseous pathology. IMPRESSION: Interval placement of a right IJ central venous line with tip over central SVC. No pneumothorax. Electronically Signed   By: Anner Crete M.D.   On: 01/16/2022 21:09   CT Head Wo Contrast  Result Date: 01/16/2022 CLINICAL DATA:  Found unresponsive concern for trauma. EXAM: CT HEAD WITHOUT CONTRAST CT CERVICAL SPINE WITHOUT CONTRAST TECHNIQUE: Multidetector CT imaging of the head and cervical spine was performed following the standard protocol without intravenous contrast. Multiplanar CT image  reconstructions of the cervical spine were also generated. RADIATION DOSE REDUCTION: This exam was performed according to the departmental dose-optimization program which includes automated exposure control, adjustment of the mA and/or kV according to patient size and/or use of iterative reconstruction technique. COMPARISON:  None Available. Cervical spine of November of 2022, CT of the head from 2011. FINDINGS: CT HEAD FINDINGS Brain: No evidence of hemorrhage, hydrocephalus, extra-axial collection or mass lesion/mass effect. 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 Signs of atrophy which has occurred since previous imaging. Vascular: No hyperdense vessel or unexpected calcification. Skull: Normal. Negative for fracture or focal lesion. Sinuses/Orbits: Scattered ethmoid opacification. Sinuses are otherwise clear. No acute orbital process. Other: None. CT CERVICAL SPINE FINDINGS Alignment: Straightening of normal cervical lordotic curvature and head turned to the LEFT, related to patient position. Skull base and vertebrae: No acute fracture. No primary bone lesion or focal pathologic process. Soft tissues and spinal canal: No prevertebral fluid or swelling. No visible canal hematoma. Patient is intubated and the endotracheal tube is in the midtrachea. Disc levels: Mild degenerative changes throughout the cervical spine. Upper chest: Dependent airspace disease at the lung apices. Other: None IMPRESSION: 1. No acute intracranial abnormality. 2. 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 3. No acute fracture or traumatic subluxation of the cervical spine. 4. Dependent airspace disease at the lung apices, may represent volume loss or aspiration related change. Electronically Signed   By: Zetta Bills M.D.   On: 01/16/2022 17:56   CT Cervical Spine Wo Contrast  Result Date: 01/16/2022 CLINICAL DATA:  Found unresponsive concern for trauma. EXAM: CT  HEAD WITHOUT CONTRAST CT CERVICAL SPINE WITHOUT CONTRAST TECHNIQUE: Multidetector CT imaging of the head and cervical spine was performed following the standard protocol without intravenous contrast. Multiplanar CT image reconstructions of the cervical spine were also generated. RADIATION DOSE REDUCTION: This exam was performed according to the departmental dose-optimization program which includes automated exposure control, adjustment of the mA and/or kV according to patient size and/or use  of iterative reconstruction technique. COMPARISON:  None Available. Cervical spine of November of 2022, CT of the head from 2011. FINDINGS: CT HEAD FINDINGS Brain: No evidence of hemorrhage, hydrocephalus, extra-axial collection or mass lesion/mass effect. 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 Signs of atrophy which has occurred since previous imaging. Vascular: No hyperdense vessel or unexpected calcification. Skull: Normal. Negative for fracture or focal lesion. Sinuses/Orbits: Scattered ethmoid opacification. Sinuses are otherwise clear. No acute orbital process. Other: None. CT CERVICAL SPINE FINDINGS Alignment: Straightening of normal cervical lordotic curvature and head turned to the LEFT, related to patient position. Skull base and vertebrae: No acute fracture. No primary bone lesion or focal pathologic process. Soft tissues and spinal canal: No prevertebral fluid or swelling. No visible canal hematoma. Patient is intubated and the endotracheal tube is in the midtrachea. Disc levels: Mild degenerative changes throughout the cervical spine. Upper chest: Dependent airspace disease at the lung apices. Other: None IMPRESSION: 1. No acute intracranial abnormality. 2. 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 3. No acute fracture or traumatic subluxation of the cervical spine. 4. Dependent airspace disease at the lung apices, may represent volume loss or  aspiration related change. Electronically Signed   By: Zetta Bills M.D.   On: 01/16/2022 17:56   DG Chest Portable 1 View  Result Date: 01/16/2022 CLINICAL DATA:  Intubation. EXAM: PORTABLE CHEST 1 VIEW COMPARISON:  Chest x-ray 04/08/2020 FINDINGS: Endotracheal tube tip is 3.4 cm above the carina. The cardiomediastinal silhouette is within normal limits. Lung volumes are low likely accentuating central pulmonary vascularity. There is no focal lung infiltrate, pleural effusion or pneumothorax. No acute fractures are seen. IMPRESSION: 1. Endotracheal tube tip is 3.4 cm above the carina. 2. Low lung volumes. Electronically Signed   By: Ronney Asters M.D.   On: 01/16/2022 17:22     TODAY-DAY OF DISCHARGE:  Subjective:   Millie Shorb today has no headache,no chest abdominal pain,no new weakness tingling or numbness, feels much better wants to go home today.  Objective:   Blood pressure 139/67, pulse 69, temperature 98.6 F (37 C), temperature source Oral, resp. rate 14, height $RemoveBe'5\' 2"'rXfrkymKU$  (1.575 m), weight 77.1 kg, SpO2 90 %.  Intake/Output Summary (Last 24 hours) at 01/24/2022 0847 Last data filed at 01/24/2022 0500 Gross per 24 hour  Intake 840 ml  Output 3500 ml  Net -2660 ml   Filed Weights   01/20/22 0500 01/21/22 0500 01/22/22 0600  Weight: 90.5 kg 91 kg 77.1 kg    Exam: Awake Alert, Oriented *3, No new F.N deficits, Normal affect [place].AT,PERRAL Supple Neck,No JVD, No cervical lymphadenopathy appriciated.  Symmetrical Chest wall movement, Good air movement bilaterally, CTAB RRR,No Gallops,Rubs or new Murmurs, No Parasternal Heave +ve B.Sounds, Abd Soft, Non tender, No organomegaly appriciated, No rebound -guarding or rigidity. No Cyanosis, Clubbing or edema, No new Rash or bruise   PERTINENT RADIOLOGIC STUDIES: No results found.   PERTINENT LAB RESULTS: CBC: Recent Labs    01/22/22 0404  WBC 15.5*  HGB 8.9*  HCT 27.0*  PLT 240   CMET CMP     Component Value  Date/Time   NA 137 01/24/2022 0301   K 3.4 (L) 01/24/2022 0301   CL 107 01/24/2022 0301   CO2 20 (L) 01/24/2022 0301   GLUCOSE 93 01/24/2022 0301   BUN 51 (H) 01/24/2022 0301   CREATININE 2.40 (H) 01/24/2022 0301   CREATININE 1.18 08/16/2020 1042   CALCIUM 8.1 (L)  01/24/2022 0301   PROT 6.4 (L) 01/16/2022 1713   ALBUMIN 2.8 (L) 01/24/2022 0301   AST 20 01/16/2022 1713   ALT 12 01/16/2022 1713   ALKPHOS 78 01/16/2022 1713   BILITOT 1.3 (H) 01/16/2022 1713   GFRNONAA 31 (L) 01/24/2022 0301   GFRAA  09/21/2009 0020    >60        The eGFR has been calculated using the MDRD equation. This calculation has not been validated in all clinical situations. eGFR's persistently <60 mL/min signify possible Chronic Kidney Disease.    GFR Estimated Creatinine Clearance: 30.5 mL/min (A) (by C-G formula based on SCr of 2.4 mg/dL (H)). No results for input(s): "LIPASE", "AMYLASE" in the last 72 hours. No results for input(s): "CKTOTAL", "CKMB", "CKMBINDEX", "TROPONINI" in the last 72 hours. Invalid input(s): "POCBNP" No results for input(s): "DDIMER" in the last 72 hours. No results for input(s): "HGBA1C" in the last 72 hours. No results for input(s): "CHOL", "HDL", "LDLCALC", "TRIG", "CHOLHDL", "LDLDIRECT" in the last 72 hours. No results for input(s): "TSH", "T4TOTAL", "T3FREE", "THYROIDAB" in the last 72 hours.  Invalid input(s): "FREET3" No results for input(s): "VITAMINB12", "FOLATE", "FERRITIN", "TIBC", "IRON", "RETICCTPCT" in the last 72 hours. Coags: No results for input(s): "INR" in the last 72 hours.  Invalid input(s): "PT" Microbiology: Recent Results (from the past 240 hour(s))  Resp Panel by RT-PCR (Flu A&B, Covid) Anterior Nasal Swab     Status: None   Collection Time: 01/16/22  4:58 PM   Specimen: Anterior Nasal Swab  Result Value Ref Range Status   SARS Coronavirus 2 by RT PCR NEGATIVE NEGATIVE Final    Comment: (NOTE) SARS-CoV-2 target nucleic acids are NOT  DETECTED.  The SARS-CoV-2 RNA is generally detectable in upper respiratory specimens during the acute phase of infection. The lowest concentration of SARS-CoV-2 viral copies this assay can detect is 138 copies/mL. A negative result does not preclude SARS-Cov-2 infection and should not be used as the sole basis for treatment or other patient management decisions. A negative result may occur with  improper specimen collection/handling, submission of specimen other than nasopharyngeal swab, presence of viral mutation(s) within the areas targeted by this assay, and inadequate number of viral copies(<138 copies/mL). A negative result must be combined with clinical observations, patient history, and epidemiological information. The expected result is Negative.  Fact Sheet for Patients:  EntrepreneurPulse.com.au  Fact Sheet for Healthcare Providers:  IncredibleEmployment.be  This test is no t yet approved or cleared by the [place] FDA and  has been authorized for detection and/or diagnosis of SARS-CoV-2 by FDA under an Emergency Use Authorization (EUA). This EUA will remain  in effect (meaning this test can be used) for the duration of the COVID-19 declaration under Section 564(b)(1) of the Act, 21 U.S.C.section 360bbb-3(b)(1), unless the authorization is terminated  or revoked sooner.       Influenza A by PCR NEGATIVE NEGATIVE Final   Influenza B by PCR NEGATIVE NEGATIVE Final    Comment: (NOTE) The Xpert Xpress SARS-CoV-2/FLU/RSV plus assay is intended as an aid in the diagnosis of influenza from Nasopharyngeal swab specimens and should not be used as a sole basis for treatment. Nasal washings and aspirates are unacceptable for Xpert Xpress SARS-CoV-2/FLU/RSV testing.  Fact Sheet for Patients: EntrepreneurPulse.com.au  Fact Sheet for Healthcare Providers: IncredibleEmployment.be  This test is not yet  approved or cleared by the [place] FDA and has been authorized for detection and/or diagnosis of SARS-CoV-2 by FDA under an Emergency Use  Authorization (EUA). This EUA will remain in effect (meaning this test can be used) for the duration of the COVID-19 declaration under Section 564(b)(1) of the Act, 21 U.S.C. section 360bbb-3(b)(1), unless the authorization is terminated or revoked.  Performed at [facility], [place] [address], [place], [place] [address]   MRSA Next Gen by PCR, Nasal     Status: None   Collection Time: 01/16/22  8:18 PM   Specimen: Peripheral; Nasal Swab  Result Value Ref Range Status   MRSA by PCR Next Gen NOT DETECTED NOT DETECTED Final    Comment: (NOTE) The GeneXpert MRSA Assay (FDA approved for NASAL specimens only), is one component of a comprehensive MRSA colonization surveillance program. It is not intended to diagnose MRSA infection nor to guide or monitor treatment for MRSA infections. Test performance is not FDA approved in patients less than 77 years old. Performed at [facility], [place] [address], [place], [place] [address]   Culture, blood (Routine X 2) w Reflex to ID Panel     Status: None   Collection Time: 01/16/22  9:52 PM   Specimen: BLOOD LEFT HAND  Result Value Ref Range Status   Specimen Description BLOOD LEFT HAND  Final   Special Requests AEROBIC BOTTLE ONLY Blood Culture adequate volume  Final   Culture   Final    NO GROWTH 5 DAYS Performed at [facility], [place] [address] [place], [place] [address]    Report Status 01/21/2022 FINAL  Final  Culture, blood (Routine X 2) w Reflex to ID Panel     Status: None   Collection Time: 01/16/22  9:52 PM   Specimen: BLOOD LEFT HAND  Result Value Ref Range Status   Specimen Description BLOOD LEFT HAND  Final   Special Requests AEROBIC BOTTLE ONLY Blood Culture adequate volume  Final   Culture   Final    NO GROWTH 5 DAYS Performed at [facility], [place] [address], [place], [place] [address]    Report Status 01/21/2022 FINAL  Final    FURTHER DISCHARGE INSTRUCTIONS:  Get Medicines reviewed and adjusted: Please take all your medications with you for your next visit with your Primary MD  Laboratory/radiological data: Please request your Primary MD to go over all hospital tests and procedure/radiological results at the follow up, please ask your Primary MD to get all Hospital records sent to his/her office.  In some cases, they will be blood work, cultures and biopsy results pending at the time of your discharge. Please request that your primary care M.D. goes through all the records of your hospital data and follows up on these results.  Also Note the following: If you experience worsening of your admission symptoms, develop shortness of breath, life threatening emergency, suicidal or homicidal thoughts you must seek medical attention immediately by calling 911 or calling your MD immediately  if symptoms less severe.  You must read complete instructions/literature along with all the possible adverse reactions/side effects for all the Medicines you take and that have been prescribed to you. Take any new Medicines after you have completely understood and accpet all the possible adverse reactions/side effects.   Do not drive when taking Pain medications or sleeping medications (Benzodaizepines)  Do not take more than prescribed Pain, Sleep and Anxiety Medications. It is not advisable to combine anxiety,sleep and pain medications without talking with your primary care practitioner  Special Instructions: If you have smoked or chewed Tobacco  in the last 2 yrs please stop smoking, stop any regular Alcohol  and or any Recreational drug use.  Wear Seat belts while driving.  Please note: You were cared for by a hospitalist during your hospital stay. Once you are discharged, your primary care physician will handle any further medical issues. Please note that NO  REFILLS for any discharge medications will be authorized once you are discharged, as it is imperative that you return to your primary care physician (or establish a relationship with a primary care physician if you do not have one) for your post hospital discharge needs so that they can reassess your need for medications and monitor your lab values.  Total Time spent coordinating discharge including counseling, education and face to face time equals greater than 30 minutes.  SignedOren Binet 01/24/2022 8:47 AM

## 2022-01-24 NOTE — Progress Notes (Signed)
PT AVS reviewed and pt verbalized understanding of all DC teaching and instructions. PT has all belongings in his possession.

## 2022-01-24 NOTE — TOC Transition Note (Addendum)
Transition of Care [facility]) - CM/SW Discharge Note   Patient Details  Name: Keith Garrett MRN: [id number] Date of Birth: 02/27/1966  [facility] CM/SW Contact:  Carles Collet, RN Phone Number: 01/24/2022, 9:04 AM   Clinical Narrative:    PCP F/U and OP PT on AVS. Referral placed to [facility], closest location to patient. Entered MATCH, and tubed form to unit in [place] and [place], Programmer, systems. Nurse instructed to provide to patient w DC instructions. RW to be delivered to the room No other TOC needs identified.       Barriers to Discharge: Continued Medical Work up   Patient Goals and CMS Choice        Discharge Placement                       Discharge Plan and Services   Discharge Planning Services: CM Consult                                 Social Determinants of Health (SDOH) Interventions     Readmission Risk Interventions     No data to display

## 2022-01-24 NOTE — Evaluation (Signed)
Occupational Therapy Evaluation Patient Details Name: Keith Garrett MRN: [id number] DOB: 23-Oct-1965 Today's Date: 01/24/2022   History of Present Illness Pt is a 56 y.o. male admitted 01/16/22 after being found down, unresponsive, on floor by family. Workup for AKI, profound acidosis, hyperkalemia. CRRT 7/28-8/1. ETT 7/28-8/2. PMH includes DM, HTN, L foot osteomyelitis (2021).   Clinical Impression   Pt currently supervision to modified independent for simulated selfcare tasks and functional transfers.  Recommend initial use of a shower seat at discharge for safety, however pt may sponge bathe for a week or so or use small seat he can find around.  No further OT needs at this time.  Pt with higher level balance deficits which can be addressed throughout recommendations of PT via outpatient.        Recommendations for follow up therapy are one component of a multi-disciplinary discharge planning process, led by the attending physician.  Recommendations may be updated based on patient status, additional functional criteria and insurance authorization.   Follow Up Recommendations  No OT follow up    Assistance Recommended at Discharge PRN  Patient can return home with the following Assistance with cooking/housework;Assist for transportation    Functional Status Assessment  Patient has not had a recent decline in their functional status  Equipment Recommendations  None recommended by OT;Other (comment) (Discussed initial need for a tub seat, but pt will likely not need in a week or so.)       Precautions / Restrictions Precautions Precautions: Fall Restrictions Weight Bearing Restrictions: No      Mobility Bed Mobility                    Transfers Overall transfer level: Needs assistance Equipment used: None Transfers: Sit to/from Stand, Bed to chair/wheelchair/BSC Sit to Stand: Modified independent (Device/Increase time)     Step pivot transfers: Modified independent  (Device/Increase time)     General transfer comment: Pt without LOB, slower movement noted compared to normal baseline.      Balance Overall balance assessment: Mild deficits observed, not formally tested                                         ADL either performed or assessed with clinical judgement   ADL Overall ADL's : Modified independent                                     Functional mobility during ADLs: Modified independent General ADL Comments: Feel that pt is not at quite his normal baseline with presence of higher level balance deficits.  He was able to ambulate in the hallway at a slower rate then normal without any assistive device and complete toilet transfer without LOB.  Discussed need for shower seat inititally at home but likely will not need long term.  Pt may just sponge bathe to start and then proceed with showering after his activity level picks up.  No further OT needs at this time.     Vision Baseline Vision/History: 1 Wears glasses (reading only) Ability to See in Adequate Light: 0 Adequate Patient Visual Report: No change from baseline Vision Assessment?: No apparent visual deficits     Perception Perception Perception: Within Functional Limits   Praxis Praxis Praxis: Intact    Pertinent Vitals/Pain Pain Assessment  Pain Assessment: No/denies pain     Hand Dominance Right   Extremity/Trunk Assessment Upper Extremity Assessment Upper Extremity Assessment: Overall WFL for tasks assessed   Lower Extremity Assessment Lower Extremity Assessment: Defer to PT evaluation   Cervical / Trunk Assessment Cervical / Trunk Assessment: Normal   Communication Communication Communication: No difficulties   Cognition Arousal/Alertness: Awake/alert Behavior During Therapy: WFL for tasks assessed/performed Overall Cognitive Status: Within Functional Limits for tasks assessed                                        General Comments               Home Living Family/patient expects to be discharged to:: Private residence Living Arrangements: Children (lives with his sons and brother) Available Help at Discharge: Family;Available PRN/intermittently Type of Home: House Home Access: Level entry     Home Layout: Two level;Able to live on main level with bedroom/bathroom     Bathroom Shower/Tub: Teacher, early years/pre: Standard     Home Equipment: None   Additional Comments: lives with two sons who work      Prior Functioning/Environment Prior Level of Function : Independent/Modified Independent;Driving;Working/employed             Mobility Comments: Independent without DME, drives, works as Probation officer OT "6 Clicks" Daily Activity     Outcome Measure Help from another person eating meals?: None Help from another person taking care of personal grooming?: None Help from another person toileting, which includes using toliet, bedpan, or urinal?: None Help from another person bathing (including washing, rinsing, drying)?: None Help from another person to put on and taking off regular upper body clothing?: None Help from another person to put on and taking off regular lower body clothing?: None 6 Click Score: 24   End of Session Equipment Utilized During Treatment: Gait belt Nurse Communication: Mobility status  Activity Tolerance: Patient tolerated treatment well Patient left: in chair;with call bell/phone within reach                   Time: 0919-0943 OT Time Calculation (min): 24 min Charges:  OT General Charges $OT Visit: 1 Visit OT Evaluation $OT Eval Low Complexity: 1 Low Olufemi Mofield OTR/L 01/24/2022, 9:52 AM

## 2022-01-24 NOTE — Progress Notes (Signed)
Physical Therapy Treatment Patient Details Name: Keith Garrett MRN: [id number] DOB: 02-04-1966 Today's Date: 01/24/2022   History of Present Illness Pt is a 56 y.o. male admitted 01/16/22 after being found down, unresponsive, on floor by family. Workup for AKI, profound acidosis, hyperkalemia. CRRT 7/28-8/1. ETT 7/28-8/2. PMH includes DM, HTN, L foot osteomyelitis (2021).    PT Comments    Pt received in chair, agreeable to therapy session after recently working with OT and with good participation in transfer, gait and stair training. Pt performed transfers with Supervision and safety cues for UE placement, gait using RW with Supervision and stairs with minA per home setup (no rail). Pt progressed to longer household distances this session but remains limited due to c/o LE cramps and fatigue, making good progress toward goals. Pt continues to benefit from PT services to progress toward functional mobility goals. No personal RW seen in room, case mgr notified of PT recommendation as below.  Recommendations for follow up therapy are one component of a multi-disciplinary discharge planning process, led by the attending physician.  Recommendations may be updated based on patient status, additional functional criteria and insurance authorization.  Follow Up Recommendations  Outpatient PT     Assistance Recommended at Discharge Intermittent Supervision/Assistance  Patient can return home with the following A little help with bathing/dressing/bathroom;Assistance with cooking/housework;Assist for transportation;Help with stairs or ramp for entrance   Equipment Recommendations  Rolling walker (2 wheels)    Recommendations for Other Services       Precautions / Restrictions Precautions Precautions: Fall Restrictions Weight Bearing Restrictions: No     Mobility  Bed Mobility               General bed mobility comments: received in chair    Transfers Overall transfer level: Needs  assistance Equipment used: Rolling walker (2 wheels) Transfers: Sit to/from Stand Sit to Stand: Supervision           General transfer comment: cues for safer UE placement and not to pull on RW to stand up, cues also needed when sitting for UE placement    Ambulation/Gait Ambulation/Gait assistance: Supervision Gait Distance (Feet): 300 Feet Assistive device: Rolling walker (2 wheels) Gait Pattern/deviations: Step-through pattern, Decreased stride length, Decreased dorsiflexion - right, Decreased dorsiflexion - left       General Gait Details: min cues for improved UE placement on RW and proximity to AD, pt not needing physical assist this date and no LOB with turning and directional changes. improving speed. Decreased dorsiflexion, pt reports calf cramps/soreness   Stairs Stairs: Yes Stairs assistance: Min assist Stair Management: No rails, Step to pattern, Forwards Number of Stairs: 3 General stair comments: cues for step sequencing, technique with no AD (HHA from family given no rails) vs using RW on steps up/down safely, pt receptive, good carryover of instruction.   Wheelchair Mobility    Modified Rankin (Stroke Patients Only)       Balance   Sitting-balance support: No upper extremity supported Sitting balance-Leahy Scale: Good     Standing balance support: Reliant on assistive device for balance Standing balance-Leahy Scale: Fair Standing balance comment: fair using RW; did not test without AD                            Cognition Arousal/Alertness: Awake/alert Behavior During Therapy: WFL for tasks assessed/performed Overall Cognitive Status: Within Functional Limits for tasks assessed  Exercises      General Comments General comments (skin integrity, edema, etc.): HR 70's-90's bpm with exertion (stairs)      Pertinent Vitals/Pain Pain Assessment Pain Assessment: Faces Faces  Pain Scale: Hurts a little bit Pain Location: BLE soreness/cramps (calf/thighs) Pain Descriptors / Indicators: Discomfort, Cramping Pain Intervention(s): Monitored during session    Home Living Family/patient expects to be discharged to:: Private residence Living Arrangements: Children (lives with his sons and brother) Available Help at Discharge: Family;Available PRN/intermittently Type of Home: House Home Access: Level entry       Home Layout: Two level;Able to live on main level with bedroom/bathroom Home Equipment: None Additional Comments: lives with two sons who work    Prior Function            PT Goals (current goals can now be found in the care plan section) Acute Rehab PT Goals Patient Stated Goal: return home and get back to work eventually PT Goal Formulation: With patient Time For Goal Achievement: 02/05/22 Progress towards PT goals: Progressing toward goals    Frequency    Min 3X/week      PT Plan Current plan remains appropriate    Co-evaluation              AM-PAC PT "6 Clicks" Mobility   Outcome Measure  Help needed turning from your back to your side while in a flat bed without using bedrails?: None Help needed moving from lying on your back to sitting on the side of a flat bed without using bedrails?: A Little Help needed moving to and from a bed to a chair (including a wheelchair)?: A Little Help needed standing up from a chair using your arms (e.g., wheelchair or bedside chair)?: A Little Help needed to walk in hospital room?: A Little Help needed climbing 3-5 steps with a railing? : A Little 6 Click Score: 19    End of Session Equipment Utilized During Treatment: Gait belt Activity Tolerance: Patient tolerated treatment well Patient left: in chair;with call bell/phone within reach Nurse Communication: Mobility status PT Visit Diagnosis: Other abnormalities of gait and mobility (R26.89);Muscle weakness (generalized) (M62.81)      Time: 5277-8242 PT Time Calculation (min) (ACUTE ONLY): 17 min  Charges:  $Gait Training: 8-22 mins                     Keith Garrett P., PTA Acute Rehabilitation Services Secure Chat Preferred 9a-5:30pm Office: [place] 01/24/2022, 11:07 AM

## 2022-01-28 ENCOUNTER — Telehealth: Payer: Self-pay

## 2022-01-28 NOTE — Telephone Encounter (Signed)
Called patient to reschedule his appointment due to a scheduling error. Dr.Wright does not work Wednesday and Thursday afternoon    Interpreter [id number]

## 2022-01-29 ENCOUNTER — Inpatient Hospital Stay: Payer: Self-pay | Admitting: Critical Care Medicine

## 2022-01-29 DIAGNOSIS — J690 Pneumonitis due to inhalation of food and vomit: Secondary | ICD-10-CM | POA: Insufficient documentation

## 2022-01-29 DIAGNOSIS — E778 Other disorders of glycoprotein metabolism: Secondary | ICD-10-CM | POA: Insufficient documentation

## 2022-01-29 DIAGNOSIS — E871 Hypo-osmolality and hyponatremia: Secondary | ICD-10-CM | POA: Insufficient documentation

## 2022-01-29 DIAGNOSIS — N19 Unspecified kidney failure: Secondary | ICD-10-CM | POA: Insufficient documentation

## 2022-01-29 DIAGNOSIS — D729 Disorder of white blood cells, unspecified: Secondary | ICD-10-CM | POA: Insufficient documentation

## 2022-01-29 DIAGNOSIS — A419 Sepsis, unspecified organism: Secondary | ICD-10-CM | POA: Insufficient documentation

## 2022-01-29 DIAGNOSIS — D638 Anemia in other chronic diseases classified elsewhere: Secondary | ICD-10-CM | POA: Insufficient documentation

## 2022-01-29 DIAGNOSIS — G9389 Other specified disorders of brain: Secondary | ICD-10-CM | POA: Insufficient documentation

## 2022-01-29 NOTE — Telephone Encounter (Signed)
Pls call pt back, he called to verify his appt time and had to tell him why, the soonest appt was into Sept. Pt has encephalopathy and Renal failure. Pls call to resch pt very understanding, [phone]

## 2022-01-31 ENCOUNTER — Other Ambulatory Visit: Payer: Self-pay

## 2022-01-31 ENCOUNTER — Observation Stay (HOSPITAL_COMMUNITY)
Admission: EM | Admit: 2022-01-31 | Discharge: 2022-02-01 | Disposition: A | Discharge status: Home / Self Care | Payer: Self-pay | Attending: Internal Medicine | Admitting: Internal Medicine

## 2022-01-31 DIAGNOSIS — N1832 Chronic kidney disease, stage 3b: Secondary | ICD-10-CM | POA: Diagnosis present

## 2022-01-31 DIAGNOSIS — Z79899 Other long term (current) drug therapy: Secondary | ICD-10-CM | POA: Insufficient documentation

## 2022-01-31 DIAGNOSIS — D649 Anemia, unspecified: Secondary | ICD-10-CM | POA: Diagnosis present

## 2022-01-31 DIAGNOSIS — I10 Essential (primary) hypertension: Secondary | ICD-10-CM | POA: Diagnosis present

## 2022-01-31 DIAGNOSIS — N184 Chronic kidney disease, stage 4 (severe): Secondary | ICD-10-CM | POA: Insufficient documentation

## 2022-01-31 DIAGNOSIS — I129 Hypertensive chronic kidney disease with stage 1 through stage 4 chronic kidney disease, or unspecified chronic kidney disease: Principal | ICD-10-CM | POA: Insufficient documentation

## 2022-01-31 DIAGNOSIS — D631 Anemia in chronic kidney disease: Secondary | ICD-10-CM | POA: Insufficient documentation

## 2022-01-31 DIAGNOSIS — E1122 Type 2 diabetes mellitus with diabetic chronic kidney disease: Secondary | ICD-10-CM | POA: Insufficient documentation

## 2022-01-31 DIAGNOSIS — Z7984 Long term (current) use of oral hypoglycemic drugs: Secondary | ICD-10-CM | POA: Insufficient documentation

## 2022-01-31 DIAGNOSIS — E875 Hyperkalemia: Secondary | ICD-10-CM

## 2022-01-31 LAB — CBC
HCT: 27.1 % — ABNORMAL LOW (ref 39.0–52.0)
Hemoglobin: 8.9 g/dL — ABNORMAL LOW (ref 13.0–17.0)
MCH: 30.6 pg (ref 26.0–34.0)
MCHC: 32.8 g/dL (ref 30.0–36.0)
MCV: 93.1 fL (ref 80.0–100.0)
Platelets: 475 10*3/uL — ABNORMAL HIGH (ref 150–400)
RBC: 2.91 MIL/uL — ABNORMAL LOW (ref 4.22–5.81)
RDW: 13.6 % (ref 11.5–15.5)
WBC: 9.8 10*3/uL (ref 4.0–10.5)
nRBC: 0 % (ref 0.0–0.2)

## 2022-01-31 LAB — BASIC METABOLIC PANEL
Anion gap: 7 (ref 5–15)
BUN: 54 mg/dL — ABNORMAL HIGH (ref 6–20)
CO2: 19 mmol/L — ABNORMAL LOW (ref 22–32)
Calcium: 8.8 mg/dL — ABNORMAL LOW (ref 8.9–10.3)
Chloride: 111 mmol/L (ref 98–111)
Creatinine, Ser: 2.4 mg/dL — ABNORMAL HIGH (ref 0.61–1.24)
GFR, Estimated: 31 mL/min — ABNORMAL LOW (ref >60)
Glucose, Bld: 111 mg/dL — ABNORMAL HIGH (ref 70–99)
Potassium: 6.1 mmol/L — ABNORMAL HIGH (ref 3.5–5.1)
Sodium: 137 mmol/L (ref 135–145)

## 2022-01-31 NOTE — ED Provider Triage Note (Signed)
Emergency Medicine Provider Triage Evaluation Note  Keith Garrett , a 56 y.o. male  was evaluated in triage.  Pt complains of abnormal labs.  Labs drawn yesterday, was called today and informed of a high potassium of 6.7.  Was encouraged to come to the ED for further work-up.  Denies chest pain, shortness of breath, urinary symptoms, abdominal pain, or N/V.  Does endorse some constipation.  No other complaints at this time.  Hx of uncontrolled DMT2 with complications, peripheral vascular disease.  Review of Systems  Positive:  Negative: See above  Physical Exam  BP 125/71 (BP Location: Right Arm)   Pulse 71   Temp 98.4 F (36.9 C)   Resp 16   SpO2 97%  Gen:   Awake, no distress   Resp:  Normal effort  MSK:   Moves extremities without difficulty  Other:  Abdomen soft nontender  Medical Decision Making  Medically screening exam initiated at 5:21 PM.  Appropriate orders placed.  Keith Garrett was informed that the remainder of the evaluation will be completed by another provider, this initial triage assessment does not replace that evaluation, and the importance of remaining in the ED until their evaluation is complete.     Prince Rome, PA-C 93/73/42 1722

## 2022-01-31 NOTE — ED Triage Notes (Signed)
Patient referred by PCP for K of 6.7 from routine labs drawn yesterday. Patient denies chest pain, SHOB, difficulty urinating.

## 2022-02-01 ENCOUNTER — Encounter (HOSPITAL_COMMUNITY): Payer: Self-pay | Admitting: Emergency Medicine

## 2022-02-01 ENCOUNTER — Other Ambulatory Visit: Payer: Self-pay

## 2022-02-01 DIAGNOSIS — N183 Chronic kidney disease, stage 3 unspecified: Secondary | ICD-10-CM | POA: Diagnosis present

## 2022-02-01 DIAGNOSIS — E875 Hyperkalemia: Secondary | ICD-10-CM

## 2022-02-01 DIAGNOSIS — E1122 Type 2 diabetes mellitus with diabetic chronic kidney disease: Secondary | ICD-10-CM | POA: Diagnosis present

## 2022-02-01 DIAGNOSIS — N1832 Chronic kidney disease, stage 3b: Secondary | ICD-10-CM

## 2022-02-01 DIAGNOSIS — I10 Essential (primary) hypertension: Secondary | ICD-10-CM | POA: Diagnosis present

## 2022-02-01 DIAGNOSIS — D649 Anemia, unspecified: Secondary | ICD-10-CM

## 2022-02-01 LAB — BASIC METABOLIC PANEL
Anion gap: 9 (ref 5–15)
BUN: 50 mg/dL — ABNORMAL HIGH (ref 6–20)
CO2: 16 mmol/L — ABNORMAL LOW (ref 22–32)
Calcium: 8 mg/dL — ABNORMAL LOW (ref 8.9–10.3)
Chloride: 114 mmol/L — ABNORMAL HIGH (ref 98–111)
Creatinine, Ser: 2.22 mg/dL — ABNORMAL HIGH (ref 0.61–1.24)
GFR, Estimated: 34 mL/min — ABNORMAL LOW (ref >60)
Glucose, Bld: 131 mg/dL — ABNORMAL HIGH (ref 70–99)
Potassium: 4.8 mmol/L (ref 3.5–5.1)
Sodium: 139 mmol/L (ref 135–145)

## 2022-02-01 LAB — CBC
HCT: 24.8 % — ABNORMAL LOW (ref 39.0–52.0)
Hemoglobin: 8.1 g/dL — ABNORMAL LOW (ref 13.0–17.0)
MCH: 31 pg (ref 26.0–34.0)
MCHC: 32.7 g/dL (ref 30.0–36.0)
MCV: 95 fL (ref 80.0–100.0)
Platelets: 383 10*3/uL (ref 150–400)
RBC: 2.61 MIL/uL — ABNORMAL LOW (ref 4.22–5.81)
RDW: 13.7 % (ref 11.5–15.5)
WBC: 11.6 10*3/uL — ABNORMAL HIGH (ref 4.0–10.5)
nRBC: 0 % (ref 0.0–0.2)

## 2022-02-01 LAB — CBG MONITORING, ED
Glucose-Capillary: 108 mg/dL — ABNORMAL HIGH (ref 70–99)
Glucose-Capillary: 137 mg/dL — ABNORMAL HIGH (ref 70–99)
Glucose-Capillary: 172 mg/dL — ABNORMAL HIGH (ref 70–99)

## 2022-02-01 MED ORDER — ONDANSETRON HCL 4 MG/2ML IJ SOLN: 4.0000 mg | Freq: Four times a day (QID) | INTRAMUSCULAR | Status: DC | PRN

## 2022-02-01 MED ORDER — ATORVASTATIN CALCIUM 40 MG PO TABS
40.0000 mg | ORAL_TABLET | Freq: Every day | ORAL | Status: DC
  Administered 2022-02-01: 40 mg via ORAL
  Filled 2022-02-01: qty 1

## 2022-02-01 MED ORDER — HEPARIN SODIUM (PORCINE) 5000 UNIT/ML IJ SOLN: 5000.0000 [IU] | Freq: Three times a day (TID) | INTRAMUSCULAR | Status: DC

## 2022-02-01 MED ORDER — ACETAMINOPHEN 325 MG PO TABS: 650.0000 mg | ORAL_TABLET | Freq: Four times a day (QID) | ORAL | Status: DC | PRN

## 2022-02-01 MED ORDER — AMLODIPINE BESYLATE 5 MG PO TABS
5.0000 mg | ORAL_TABLET | Freq: Every day | ORAL | Status: DC
  Administered 2022-02-01: 5 mg via ORAL
  Filled 2022-02-01: qty 1

## 2022-02-01 MED ORDER — INSULIN ASPART 100 UNIT/ML IJ SOLN
0.0000 [IU] | Freq: Three times a day (TID) | INTRAMUSCULAR | Status: DC
  Administered 2022-02-01: 1 [IU] via SUBCUTANEOUS

## 2022-02-01 MED ORDER — SODIUM CHLORIDE 0.9 % IV SOLN: INTRAVENOUS | Status: AC

## 2022-02-01 MED ORDER — SODIUM ZIRCONIUM CYCLOSILICATE 5 G PO PACK
5.0000 g | PACK | Freq: Once | ORAL | Status: AC
  Administered 2022-02-01: 5 g via ORAL
  Filled 2022-02-01: qty 1

## 2022-02-01 MED ORDER — SODIUM CHLORIDE 0.9 % IV BOLUS
1000.0000 mL | Freq: Once | INTRAVENOUS | Status: AC
Start: 2022-02-01 — End: 2022-02-01
  Administered 2022-02-01: 1000 mL via INTRAVENOUS

## 2022-02-01 MED ORDER — INSULIN ASPART 100 UNIT/ML IV SOLN
5.0000 [IU] | Freq: Once | INTRAVENOUS | Status: AC
Start: 2022-02-01 — End: 2022-02-01
  Administered 2022-02-01: 5 [IU] via INTRAVENOUS

## 2022-02-01 MED ORDER — DORZOLAMIDE HCL-TIMOLOL MAL 2-0.5 % OP SOLN
1.0000 [drp] | Freq: Two times a day (BID) | OPHTHALMIC | Status: DC
Start: 2022-02-01 — End: 2022-02-01
  Filled 2022-02-01: qty 10

## 2022-02-01 MED ORDER — DEXTROSE 50 % IV SOLN
1.0000 | Freq: Once | INTRAVENOUS | Status: AC
Start: 2022-02-01 — End: 2022-02-01
  Administered 2022-02-01: 50 mL via INTRAVENOUS
  Filled 2022-02-01: qty 50

## 2022-02-01 MED ORDER — ALBUTEROL SULFATE (2.5 MG/3ML) 0.083% IN NEBU
10.0000 mg | INHALATION_SOLUTION | Freq: Once | RESPIRATORY_TRACT | Status: AC
Start: 2022-02-01 — End: 2022-02-01
  Administered 2022-02-01: 10 mg via RESPIRATORY_TRACT
  Filled 2022-02-01 (×2): qty 12

## 2022-02-01 MED ORDER — ACETAMINOPHEN 650 MG RE SUPP: 650.0000 mg | Freq: Four times a day (QID) | RECTAL | Status: DC | PRN

## 2022-02-01 MED ORDER — SODIUM ZIRCONIUM CYCLOSILICATE 10 G PO PACK
10.0000 g | PACK | Freq: Once | ORAL | Status: AC
  Administered 2022-02-01: 10 g via ORAL
  Filled 2022-02-01: qty 1

## 2022-02-01 MED ORDER — SODIUM CHLORIDE 0.9% FLUSH
3.0000 mL | Freq: Two times a day (BID) | INTRAVENOUS | Status: DC
Start: 2022-02-01 — End: 2022-02-01
  Administered 2022-02-01 (×2): 3 mL via INTRAVENOUS

## 2022-02-01 MED ORDER — ONDANSETRON HCL 4 MG PO TABS: 4.0000 mg | ORAL_TABLET | Freq: Four times a day (QID) | ORAL | Status: DC | PRN

## 2022-02-01 NOTE — H&P (Signed)
History and Physical    Treyson Axel PRF:[id number] DOB: 19-Nov-1965 DOA: 01/31/2022  PCP: Salvadore Farber, FNP   Patient coming from: Home   Chief Complaint: Lab abnormality   HPI: Keith Garrett is a pleasant 56 y.o. male with medical history significant for hypertension, type 2 diabetes mellitus, and CKD, presenting to the emergency department for hyperkalemia on outpatient blood work.  Patient was admitted to the hospital on 01/16/2022 with shock and acute kidney injury with hyperkalemia, was treated with CRRT for 72 hours, renal function improved and stabilized, lisinopril and metformin were discontinued, and he was discharged on 01/24/2022.  Patient has felt well since the discharge, has gone back to work, and saw his PCP for follow-up with chemistry panel.  He was notified that potassium was 6.7 and directed to the ED for evaluation.  Patient states that he misunderstood the discharge instructions, did not read the discharge paperwork, and has been continuing to take lisinopril at home.  He denies any chest pain, nausea, vomiting, or diarrhea.  ED Course: Upon arrival to the ED, patient is found to be afebrile and saturating well on room air with stable blood pressure.  Chemistry panel notable for potassium 6.1, bicarbonate 19, BUN 54, and creatinine 2.40.  CBC features a normocytic anemia with hemoglobin 8.9 and platelets 1 and 75,000.  EKG demonstrates sinus rhythm with LAD and normal QRS.  Patient was given a liter of saline and Lokelma in the ED.  Review of Systems:  All other systems reviewed and apart from HPI, are negative.  Past Medical History:  Diagnosis Date   Cellulitis of foot, left 04/13/2020   CKD (chronic kidney disease) stage 4, GFR 15-29 ml/min (HCC) 12/2021   per chart review Crt 2.74, GFR 26 on 01/03/2022   Diabetes mellitus without complication ([place])    Hypertension    Osteomyelitis of ankle or foot, acute, left ([place]) 04/19/2020   Traumatic rupture of plantar fascia  of left foot 04/14/2020    Past Surgical History:  Procedure Laterality Date   ABDOMINAL AORTOGRAM W/LOWER EXTREMITY N/A 04/17/2020   Procedure: ABDOMINAL AORTOGRAM W/LOWER EXTREMITY;  Surgeon: Marty Heck, MD;  Location: [place] [facility];  Service: Cardiovascular;  Laterality: N/A;   TEE WITHOUT CARDIOVERSION N/A 04/16/2020   Procedure: TRANSESOPHAGEAL ECHOCARDIOGRAM (TEE);  Surgeon: [name], MD;  Location: [facility] ENDOS[facility];  Service: Cardiovascular;  Laterality: N/A;    Social History:   reports that he has never smoked. He has never used smokeless tobacco. He reports current alcohol use. He reports that he does not use drugs.  No Known Allergies  History reviewed. No pertinent family history.   Prior to Admission medications   Medication Sig Start Date End Date Taking? Authorizing Provider  amLODipine (NORVASC) 10 MG tablet Take 0.5 tablets (5 mg total) by mouth daily. 01/24/22   Ghimire, Henreitta Leber, MD  atorvastatin (LIPITOR) 40 MG tablet Take 1 tablet (40 mg total) by mouth daily. 04/20/20   Benay Pike, MD  dorzolamide-timolol (COSOPT) 22.3-6.8 MG/ML ophthalmic solution Place 1 drop into the left eye 2 (two) times daily.  12/27/19   [provider]  linagliptin (TRADJENTA) 5 MG TABS tablet Take 1 tablet (5 mg total) by mouth daily. 01/24/22   Jonetta Osgood, MD    Physical Exam: Vitals:   01/31/22 2037 02/01/22 0000 02/01/22 0015 02/01/22 0031  BP: 122/77 (!) 151/75 (!) 151/79   Pulse: 68 75 74   Resp: 16 19 14  Temp:    98.9 F (37.2 C)  TempSrc:    Oral  SpO2: 98% 99% 97%     Constitutional: NAD, calm  Eyes: PERTLA, lids and conjunctivae normal ENMT: Mucous membranes are moist. Posterior pharynx clear of any exudate or lesions.   Neck: supple, no masses  Respiratory:  no wheezing, no crackles. No accessory muscle use.  Cardiovascular: S1 & S2 heard, regular rate and rhythm. Trace LE edema b/l. Abdomen: No distension, no  tenderness, soft. Bowel sounds active.  Musculoskeletal: no clubbing / cyanosis. No joint deformity upper and lower extremities.   Skin: no significant rashes, lesions, ulcers. Warm, dry, well-perfused. Neurologic: CN 2-12 grossly intact. Moving all extremities. Alert and oriented.  Psychiatric: Pleasant. Cooperative.    Labs and Imaging on Admission: I have personally reviewed following labs and imaging studies  CBC: Recent Labs  Lab 01/31/22 1722  WBC 9.8  HGB 8.9*  HCT 27.1*  MCV 93.1  PLT 629*   Basic Metabolic Panel: Recent Labs  Lab 01/31/22 1722  NA 137  K 6.1*  CL 111  CO2 19*  GLUCOSE 111*  BUN 54*  CREATININE 2.40*  CALCIUM 8.8*   GFR: Estimated Creatinine Clearance: 30.5 mL/min (A) (by C-G formula based on SCr of 2.4 mg/dL (H)). Liver Function Tests: No results for input(s): "AST", "ALT", "ALKPHOS", "BILITOT", "PROT", "ALBUMIN" in the last 168 hours. No results for input(s): "LIPASE", "AMYLASE" in the last 168 hours. No results for input(s): "AMMONIA" in the last 168 hours. Coagulation Profile: No results for input(s): "INR", "PROTIME" in the last 168 hours. Cardiac Enzymes: No results for input(s): "CKTOTAL", "CKMB", "CKMBINDEX", "TROPONINI" in the last 168 hours. BNP (last 3 results) No results for input(s): "PROBNP" in the last 8760 hours. HbA1C: No results for input(s): "HGBA1C" in the last 72 hours. CBG: No results for input(s): "GLUCAP" in the last 168 hours. Lipid Profile: No results for input(s): "CHOL", "HDL", "LDLCALC", "TRIG", "CHOLHDL", "LDLDIRECT" in the last 72 hours. Thyroid Function Tests: No results for input(s): "TSH", "T4TOTAL", "FREET4", "T3FREE", "THYROIDAB" in the last 72 hours. Anemia Panel: No results for input(s): "VITAMINB12", "FOLATE", "FERRITIN", "TIBC", "IRON", "RETICCTPCT" in the last 72 hours. Urine analysis:    Component Value Date/Time   COLORURINE STRAW (A) 01/18/2022 0953   APPEARANCEUR CLEAR 01/18/2022 0953    LABSPEC 1.006 01/18/2022 0953   PHURINE 7.0 01/18/2022 0953   GLUCOSEU >=500 (A) 01/18/2022 0953   HGBUR MODERATE (A) 01/18/2022 0953   BILIRUBINUR NEGATIVE 01/18/2022 0953   KETONESUR 5 (A) 01/18/2022 0953   PROTEINUR 30 (A) 01/18/2022 0953   UROBILINOGEN 0.2 09/21/2009 0047   NITRITE NEGATIVE 01/18/2022 0953   LEUKOCYTESUR NEGATIVE 01/18/2022 0953   Sepsis Labs: @LABRCNTIP (procalcitonin:4,lacticidven:4) )No results found for this or any previous visit (from the past 240 hour(s)).   Radiological Exams on Admission: No results found.  EKG: Independently reviewed. SR, LAD, QRS 94 ms.   Assessment/Plan   1. Hyperkalemia; CKD IIIb  - Sent by PCP for hyperkalemia  - Potassium 6.1 in ED  - SCr is 2.40, stable from recent discharge  - Lisinopril was discontinued during recent admission but he misunderstood and has continued to take it  - Continue cardiac monitoring, treat with Lokelma, insulin/dextrose, IVF, and albuterol, provide low-potassium diet, repeat chem panel    2. Hypertension  - Continue amlodipine   3. Type II DM  - A1c was 6.9% in July 2023   - Check CBGs and use low-intensity SSI    4. Anemia  -  Appears stable, no bleeding    DVT prophylaxis: sq heparin  Code Status: Full  Level of Care: Level of care: Telemetry Medical Family Communication: none present  Disposition Plan:  Patient is from: home  Anticipated d/c is to: Home  Anticipated d/c date is: 8/14 or 02/03/22  Patient currently: Pending resolution of hyperkalemia  Consults called: none Admission status: Observation     Vianne Bulls, MD [facility]  02/01/2022, 1:14 AM

## 2022-02-01 NOTE — Assessment & Plan Note (Addendum)
Glucose has been stable.  Continue glucose control with linagliptin.  Plan to continue life style modification and follow up as outpatient.   Dyslipidemia, continue with statin therapy.

## 2022-02-01 NOTE — Assessment & Plan Note (Signed)
Cell count has been stable, plan to follow up as outpatient.

## 2022-02-01 NOTE — Assessment & Plan Note (Addendum)
Continue blood pressure control with amlodipine.  Resume diuretic therapy with HCTZ.

## 2022-02-01 NOTE — Discharge Summary (Signed)
Physician Discharge Summary   Patient: Keith Garrett MRN: [id number] DOB: 11-16-1965  Admit date:     01/31/2022  Discharge date: 02/01/22  Discharge Physician: Jimmy Picket Charlina Dwight   PCP: Salvadore Farber, FNP   Recommendations at discharge:    Potassium has improved, patient will need follow up renal function and electrolytes in 7 days. He has explained not to take lisinopril. 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 [facility] Course: Keith Garrett was admitted to the hospital with the working diagnosis of hyperkalemia.   55 yo male with the past medical history of hypertension, T2DM, and CKD who presented with hyperkalemia. Recent hospitalization on 01/16/22 to 01/24/22 for shock, requiring renal replacement therapy for 72 hrs, at the time of his discharge lisinopril and metformin were discontinued. Apparently patient continue taking lisinopril, at home. Outpatient follow up with his primary care showed hyperkalemia and he was referred to the hospital for further management.  On his initial physical examination his blood pressure was 151/75, HR 75, RR 19 and 02 saturation 99%, heart with S1 and S2 present and rhythmic, lungs with no rales or wheezing, abdomen not distended and no lower extremity edema.   Na 137, K 6,1 CL 111, bicarbonate 19, glucose 111, bun 54, cr 2.40 Wbc 9,8 hgb 8,9 plt 475   EKG 72 bpm, left axis deviation, normal intervals, sinus rhythm with J point elevation in V2 and V3, with no significant ST segment or T wave changes.   Patient received sodium zirconium, IV fluids, along with insulin and dextrose with improvement in serum K.  Renal function has remained stable.   Assessment  and Plan: * Stage 3b chronic kidney disease (CKD) ([place]) Patient with improvement in serum K, today is 4,8, with serum cr at 2.22, CL 114 and serum bicarbonate at 16 with anion gap of 9. P 4.1  Plan to continue holding ACE inh Add oral sodium bicarbonate for non anion gap metabolic acidosis.  Repeat one dose of 5 mg of sodium zirconium and plan to follow up renal function and electrolytes as outpatient.   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  Continue glucose control with linagliptin.  Plan to continue life style modification and follow up as outpatient.   Dyslipidemia, continue with statin therapy.   Hypertension Continue blood pressure control with amlodipine.  Resume diuretic therapy with HCTZ.   Normocytic anemia Cell count has been stable, plan to follow up as outpatient.          Consultants: none Procedures performed: none  Disposition: Home Diet recommendation:  Cardiac and Carb modified diet DISCHARGE MEDICATION: Allergies as of 02/01/2022   No Known Allergies      Medication List     TAKE these medications    amLODipine 10 MG tablet Commonly known as: NORVASC Take 0.5 tablets (5 mg total) by mouth daily. What changed: how much to take   atorvastatin 40 MG tablet Commonly known as: LIPITOR Take 1 tablet (40 mg total) by mouth daily.   hydrochlorothiazide 25 MG tablet Commonly known as: HYDRODIURIL Take 25 mg by mouth daily.   linagliptin 5 MG Tabs tablet Commonly known as: TRADJENTA  Take 1 tablet (5 mg total) by mouth daily.        Discharge Exam: There were no vitals filed for this visit. BP 129/74   Pulse 74   Temp 98.5 F (36.9 C) (Oral)   Resp 15   SpO2 99%   Patient is feeling well, no dyspnea or chest pain,   Neurology awake and alert ENT with no pallor Cardiovascular with S1 and S2 present and rhythmic with no gallops, rubs or murmurs Respiratory with no rales or  rhonchi Abdomen with no distention Trace non pitting lower extremity edema   Condition at discharge: stable  The results of significant diagnostics from this hospitalization (including imaging, microbiology, ancillary and laboratory) are listed below for reference.   Imaging Studies: DG Chest Port 1 View  Result Date: 01/19/2022 CLINICAL DATA:  Endotracheal tube, respiratory failure.  CHF EXAM: PORTABLE CHEST 1 VIEW COMPARISON:  Radiograph January 18, 2022. FINDINGS: Endotracheal tube with tip projecting over the proximal thoracic trachea. Nasogastric tube courses below the diaphragm with tip obscured by collimation. Right IJ CVC with tip projecting over the SVC. Additional EKG leads project over the chest. Improved aeration of the lungs with decreased left-greater-than-right basilar predominant interstitial opacities and patchy airspace opacities. The heart size and mediastinal contours are unchanged. No acute osseous abnormality. IMPRESSION: Improved aeration of the lungs with decreased left-greater-than-right basilar predominant interstitial opacities and patchy airspace opacities. Electronically Signed   By: Dahlia Bailiff M.D.   On: 01/19/2022 08:10   DG Chest Port 1 View  Result Date: 01/18/2022 CLINICAL DATA:  Metabolic encephalopathy with ventilator dependent respiratory failure. EXAM: PORTABLE CHEST 1 VIEW COMPARISON:  The earlier study today at 12:32 a.m. FINDINGS: 5:16 a.m. ETT tip is 4.3 cm from the carina. NGT passes well into the stomach but neither the side-hole or tip are included in the film. Right IJ double-lumen catheter terminates in the distal SVC as before. There is mild cardiomegaly increased central vascular prominence, mild increased central edema. There is worsening patchy dense consolidation in the left mid and lower lung field, increased small left-greater-than-right pleural effusions and unchanged appearance of right lung mid perihilar consolidation. Findings are concerning  for worsening pneumonia on the left with stable airspace disease on the right. The upper lung fields are generally clear. In all other respects no further changes. IMPRESSION: Increasing airspace disease and pleural effusion on the left. No interval change in right mid perihilar consolidation. Increasing central vascular prominence and mild central edema. Electronically Signed   By: Telford Nab M.D.   On: 01/18/2022 07:26   DG CHEST PORT 1 VIEW  Result Date: 01/18/2022 CLINICAL DATA:  Shortness of breath. EXAM: PORTABLE CHEST 1 VIEW COMPARISON:  Earlier radiograph dated 01/17/2022. FINDINGS: Endotracheal tube remains above the carina. Right IJ central venous line in similar position. Enteric tube extends below the diaphragm with tip beyond the inferior margin of the image. Shallow inspiration with bibasilar atelectasis versus infiltrate, left greater right. Overall interval improvement of the aeration of the lungs compared to prior radiograph. No pneumothorax. Possible trace pleural effusions. Stable cardiomediastinal silhouette. No acute osseous pathology. IMPRESSION: Shallow inspiration with bibasilar atelectasis versus infiltrate, left greater right. Overall interval improvement of the aeration of the lungs compared to prior radiograph. Electronically Signed   By: Anner Crete M.D.   On: 01/18/2022 00:43   DG CHEST PORT 1 VIEW  Result Date: 01/17/2022 CLINICAL DATA:  Atelectasis. EXAM: PORTABLE CHEST 1 VIEW COMPARISON:  January 17, 2022 FINDINGS: The  ETT is in good position. The NG tube terminates below today's film. The right central line is stable. No pneumothorax. The right lung is clear. Haziness over the left mid lower chest is increased. No other acute abnormalities. IMPRESSION: 1. Support apparatus as above. 2. Increased haziness in the left mid lower lung is favored to represent layering effusion with underlying atelectasis. Developing infiltrate is possible as well. Recommend clinical  correlation and attention on short-term follow-up. Electronically Signed   By: Dorise Bullion III M.D.   On: 01/17/2022 16:49   ECHOCARDIOGRAM COMPLETE  Result Date: 01/17/2022    ECHOCARDIOGRAM REPORT   Patient Name:   AVIGDOR DOLLAR Date of Exam: 01/17/2022 Medical Rec #:  [id number]        Height:       62.0 in Accession #:    [id number]       Weight:       213.4 lb Date of Birth:  31-May-1966        BSA:          1.965 m Patient Age:    89 years         BP:           129/62 mmHg Patient Gender: M                HR:           99 bpm. Exam Location:  Inpatient Procedure: 2D Echo, Cardiac Doppler, Color Doppler and Intracardiac            Opacification Agent Indications:    I42.9 Cardiomyopathy (unspecified)  History:        Patient has prior history of Echocardiogram examinations, most                 recent 04/16/2020. TIA; Signs/Symptoms:Dyspnea.  Sonographer:    Roseanna Rainbow RDCS Referring Phys: [id number] Candee Furbish  Sonographer Comments: Technically difficult study due to poor echo windows, patient is morbidly obese and echo performed with patient supine and on artificial respirator. Image acquisition challenging due to patient body habitus. IMPRESSIONS  1. Left ventricular ejection fraction, by estimation, is 65 to 70%. The left ventricle has hyperdynamic function. The left ventricle has no regional wall motion abnormalities. Left ventricular diastolic parameters were normal.  2. Right ventricular systolic function is hyperdynamic. The right ventricular size is normal. There is mildly elevated pulmonary artery systolic pressure. The estimated right ventricular systolic pressure is 09.3 mmHg.  3. Left atrial size was moderately dilated.  4. The mitral valve is normal in structure. No evidence of mitral valve regurgitation.  5. The aortic valve is tricuspid. There is mild calcification of the aortic valve. There is mild thickening of the aortic valve. Aortic valve regurgitation is not visualized. Aortic  valve sclerosis/calcification is present, without any evidence of aortic stenosis.  6. The inferior vena cava is dilated in size with <50% respiratory variability, suggesting right atrial pressure of 15 mmHg. Comparison(s): No significant change from prior study. Prior images reviewed side by side. FINDINGS  Left Ventricle: Left ventricular ejection fraction, by estimation, is 65 to 70%. The left ventricle has hyperdynamic function. The left ventricle has no regional wall motion abnormalities. Definity contrast agent was given IV to delineate the left ventricular endocardial borders. The left ventricular internal cavity size was normal in size. There is borderline concentric left ventricular hypertrophy. Left ventricular diastolic parameters were normal. Normal left ventricular filling pressure. Right Ventricle: The right ventricular size is normal.  No increase in right ventricular wall thickness. Right ventricular systolic function is hyperdynamic. There is mildly elevated pulmonary artery systolic pressure. The tricuspid regurgitant velocity is 2.61 m/s, and with an assumed right atrial pressure of 15 mmHg, the estimated right ventricular systolic pressure is 03.5 mmHg. Left Atrium: Left atrial size was moderately dilated. Right Atrium: Right atrial size was normal in size. Pericardium: There is no evidence of pericardial effusion. Mitral Valve: The mitral valve is normal in structure. Mild to moderate mitral annular calcification. No evidence of mitral valve regurgitation. Tricuspid Valve: The tricuspid valve is normal in structure. Tricuspid valve regurgitation is trivial. Aortic Valve: The aortic valve is tricuspid. There is mild calcification of the aortic valve. There is mild thickening of the aortic valve. Aortic valve regurgitation is not visualized. Aortic valve sclerosis/calcification is present, without any evidence of aortic stenosis. Aortic valve mean gradient measures 13.6 mmHg. Aortic valve peak  gradient measures 24.6 mmHg. Aortic valve area, by VTI measures 2.10 cm. Pulmonic Valve: The pulmonic valve was grossly normal. Pulmonic valve regurgitation is not visualized. Aorta: The aortic root and ascending aorta are structurally normal, with no evidence of dilitation. Venous: The inferior vena cava is dilated in size with less than 50% respiratory variability, suggesting right atrial pressure of 15 mmHg. IAS/Shunts: No atrial level shunt detected by color flow Doppler.  LEFT VENTRICLE PLAX 2D LVIDd:         5.00 cm      Diastology LVIDs:         2.80 cm      LV e' medial:    11.60 cm/s LV PW:         1.20 cm      LV E/e' medial:  7.3 LV IVS:        1.20 cm      LV e' lateral:   9.14 cm/s LVOT diam:     1.90 cm      LV E/e' lateral: 9.2 LV SV:         81 LV SV Index:   41 LVOT Area:     2.84 cm  LV Volumes (MOD) LV vol d, MOD A2C: 94.7 ml LV vol d, MOD A4C: 118.0 ml LV vol s, MOD A2C: 33.7 ml LV vol s, MOD A4C: 41.1 ml LV SV MOD A2C:     61.0 ml LV SV MOD A4C:     118.0 ml LV SV MOD BP:      71.0 ml RIGHT VENTRICLE             IVC RV S prime:     22.10 cm/s  IVC diam: 2.80 cm TAPSE (M-mode): 2.4 cm LEFT ATRIUM             Index        RIGHT ATRIUM           Index LA diam:        4.70 cm 2.39 cm/m   RA Area:     11.20 cm LA Vol (A2C):   34.0 ml 17.30 ml/m  RA Volume:   19.80 ml  10.08 ml/m LA Vol (A4C):   45.3 ml 23.05 ml/m LA Biplane Vol: 42.2 ml 21.47 ml/m  AORTIC VALVE AV Area (Vmax):    1.98 cm AV Area (Vmean):   2.00 cm AV Area (VTI):     2.10 cm AV Vmax:           248.00 cm/s AV Vmean:  170.400 cm/s AV VTI:            0.385 m AV Peak Grad:      24.6 mmHg AV Mean Grad:      13.6 mmHg LVOT Vmax:         173.00 cm/s LVOT Vmean:        120.000 cm/s LVOT VTI:          0.286 m LVOT/AV VTI ratio: 0.74  AORTA Ao Root diam: 3.10 cm Ao Asc diam:  3.20 cm MITRAL VALVE               TRICUSPID VALVE MV Area (PHT): 4.37 cm    TR Peak grad:   27.2 mmHg MV Decel Time: 174 msec    TR Vmax:         261.00 cm/s MV E velocity: 84.37 cm/s MV A velocity: 90.80 cm/s  SHUNTS MV E/A ratio:  0.93        Systemic VTI:  0.29 m                            Systemic Diam: 1.90 cm Dani Gobble Croitoru MD Electronically signed by Sanda Klein MD Signature Date/Time: 01/17/2022/12:52:13 PM    Final    DG Chest Port 1 View  Result Date: 01/17/2022 CLINICAL DATA:  56 year old male with history of aspiration pneumonia. EXAM: PORTABLE CHEST 1 VIEW COMPARISON:  Chest x-ray 01/16/2022. FINDINGS: An endotracheal tube is in place with tip 5.1 cm above the carina. There is a right-sided internal jugular central venous catheter with tip terminating in the distal superior vena cava. A nasogastric tube is seen extending into the stomach, however, the tip of the nasogastric tube extends below the lower margin of the image. Lung volumes are low. Opacity at the left base which may reflect atelectasis and/or consolidation, worsened compared to the prior study. Small left pleural effusion. Right lung is clear. No right pleural effusion. No pneumothorax. No evidence of pulmonary edema. Heart size is normal. Upper mediastinal contours are within normal limits. Atherosclerotic calcifications are noted in the thoracic aorta. IMPRESSION: 1. Support apparatus, as above. 2. Atelectasis and/or consolidation in the left lower lobe with small left pleural effusion. Electronically Signed   By: Vinnie Langton M.D.   On: 01/17/2022 07:22   US RENAL  Result Date: 01/16/2022 CLINICAL DATA:  Acute kidney injury EXAM: RENAL / URINARY TRACT ULTRASOUND COMPLETE COMPARISON:  None Available. FINDINGS: Right Kidney: Renal measurements: 12.0 x 5.7 x 5.7 cm = volume: 205 mL. Echogenicity within normal limits. No mass or hydronephrosis visualized. Left Kidney: Renal measurements: 13.9 x 6.7 x 6.6 cm = volume: 320 mL. Echogenicity within normal limits. No mass or hydronephrosis visualized. Bladder: Decompressed by Foley catheter. Other: None. IMPRESSION: 1. Normal  appearance of kidneys. 2. Electronically Signed   By: Ronney Asters M.D.   On: 01/16/2022 22:56   DG CHEST PORT 1 VIEW  Result Date: 01/16/2022 CLINICAL DATA:  Central line placement. EXAM: PORTABLE CHEST 1 VIEW COMPARISON:  Earlier radiograph dated 01/16/2022. FINDINGS: Interval placement of a right IJ central venous line with tip over central SVC. No pneumothorax. Endotracheal tube remains above the carina. Enteric tube extends below the diaphragm with tip beyond the inferior margin of the image. No interval change in bilateral pulmonary streaky densities since the earlier radiograph. Stable cardiomediastinal silhouette. No acute osseous pathology. IMPRESSION: Interval placement of a right IJ central venous line with tip over  central SVC. No pneumothorax. Electronically Signed   By: Anner Crete M.D.   On: 01/16/2022 21:09   CT Head Wo Contrast  Result Date: 01/16/2022 CLINICAL DATA:  Found unresponsive concern for trauma. EXAM: CT HEAD WITHOUT CONTRAST CT CERVICAL SPINE WITHOUT CONTRAST TECHNIQUE: Multidetector CT imaging of the head and cervical spine was performed following the standard protocol without intravenous contrast. Multiplanar CT image reconstructions of the cervical spine were also generated. RADIATION DOSE REDUCTION: This exam was performed according to the departmental dose-optimization program which includes automated exposure control, adjustment of the mA and/or kV according to patient size and/or use of iterative reconstruction technique. COMPARISON:  None Available. Cervical spine of November of 2022, CT of the head from 2011. FINDINGS: CT HEAD FINDINGS Brain: No evidence of hemorrhage, hydrocephalus, extra-axial collection or mass lesion/mass effect. 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 Signs of atrophy which has occurred since previous imaging. Vascular: No hyperdense vessel or unexpected calcification. Skull: Normal. Negative for fracture  or focal lesion. Sinuses/Orbits: Scattered ethmoid opacification. Sinuses are otherwise clear. No acute orbital process. Other: None. CT CERVICAL SPINE FINDINGS Alignment: Straightening of normal cervical lordotic curvature and head turned to the LEFT, related to patient position. Skull base and vertebrae: No acute fracture. No primary bone lesion or focal pathologic process. Soft tissues and spinal canal: No prevertebral fluid or swelling. No visible canal hematoma. Patient is intubated and the endotracheal tube is in the midtrachea. Disc levels: Mild degenerative changes throughout the cervical spine. Upper chest: Dependent airspace disease at the lung apices. Other: None IMPRESSION: 1. No acute intracranial abnormality. 2. 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 3. No acute fracture or traumatic subluxation of the cervical spine. 4. Dependent airspace disease at the lung apices, may represent volume loss or aspiration related change. Electronically Signed   By: Zetta Bills M.D.   On: 01/16/2022 17:56   CT Cervical Spine Wo Contrast  Result Date: 01/16/2022 CLINICAL DATA:  Found unresponsive concern for trauma. EXAM: CT HEAD WITHOUT CONTRAST CT CERVICAL SPINE WITHOUT CONTRAST TECHNIQUE: Multidetector CT imaging of the head and cervical spine was performed following the standard protocol without intravenous contrast. Multiplanar CT image reconstructions of the cervical spine were also generated. RADIATION DOSE REDUCTION: This exam was performed according to the departmental dose-optimization program which includes automated exposure control, adjustment of the mA and/or kV according to patient size and/or use of iterative reconstruction technique. COMPARISON:  None Available. Cervical spine of November of 2022, CT of the head from 2011. FINDINGS: CT HEAD FINDINGS Brain: No evidence of hemorrhage, hydrocephalus, extra-axial collection or mass lesion/mass effect.  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 Signs of atrophy which has occurred since previous imaging. Vascular: No hyperdense vessel or unexpected calcification. Skull: Normal. Negative for fracture or focal lesion. Sinuses/Orbits: Scattered ethmoid opacification. Sinuses are otherwise clear. No acute orbital process. Other: None. CT CERVICAL SPINE FINDINGS Alignment: Straightening of normal cervical lordotic curvature and head turned to the LEFT, related to patient position. Skull base and vertebrae: No acute fracture. No primary bone lesion or focal pathologic process. Soft tissues and spinal canal: No prevertebral fluid or swelling. No visible canal hematoma. Patient is intubated and the endotracheal tube is in the midtrachea. Disc levels: Mild degenerative changes throughout the cervical spine. Upper chest: Dependent airspace disease at the lung apices. Other: None IMPRESSION: 1. No acute intracranial abnormality. 2. Encephalomalacia in the LEFT occipital lobe compatible with  prior infarct. 3. No acute fracture or traumatic subluxation of the cervical spine. 4. Dependent airspace disease at the lung apices, may represent volume loss or aspiration related change. Electronically Signed   By: Zetta Bills M.D.   On: 01/16/2022 17:56   DG Chest Portable 1 View  Result Date: 01/16/2022 CLINICAL DATA:  Intubation. EXAM: PORTABLE CHEST 1 VIEW COMPARISON:  Chest x-ray 04/08/2020 FINDINGS: Endotracheal tube tip is 3.4 cm above the carina. The cardiomediastinal silhouette is within normal limits. Lung volumes are low likely accentuating central pulmonary vascularity. There is no focal lung infiltrate, pleural effusion or pneumothorax. No acute fractures are seen. IMPRESSION: 1. Endotracheal tube tip is 3.4 cm above the carina. 2. Low lung volumes. Electronically Signed   By: Ronney Asters M.D.   On: 01/16/2022 17:22    Microbiology: Results for orders placed or performed during  the hospital encounter of 01/16/22  Resp Panel by RT-PCR (Flu A&B, Covid) Anterior Nasal Swab     Status: None   Collection Time: 01/16/22  4:58 PM   Specimen: Anterior Nasal Swab  Result Value Ref Range Status   SARS Coronavirus 2 by RT PCR NEGATIVE NEGATIVE Final    Comment: (NOTE) SARS-CoV-2 target nucleic acids are NOT DETECTED.  The SARS-CoV-2 RNA is generally detectable in upper respiratory specimens during the acute phase of infection. The lowest concentration of SARS-CoV-2 viral copies this assay can detect is 138 copies/mL. A negative result does not preclude SARS-Cov-2 infection and should not be used as the sole basis for treatment or other patient management decisions. A negative result may occur with  improper specimen collection/handling, submission of specimen other than nasopharyngeal swab, presence of viral mutation(s) within the areas targeted by this assay, and inadequate number of viral copies(<138 copies/mL). A negative result must be combined with clinical observations, patient history, and epidemiological information. The expected result is Negative.  Fact Sheet for Patients:  EntrepreneurPulse.com.au  Fact Sheet for Healthcare Providers:  IncredibleEmployment.be  This test is no t yet approved or cleared by the [place] FDA and  has been authorized for detection and/or diagnosis of SARS-CoV-2 by FDA under an Emergency Use Authorization (EUA). This EUA will remain  in effect (meaning this test can be used) for the duration of the COVID-19 declaration under Section 564(b)(1) of the Act, 21 U.S.C.section 360bbb-3(b)(1), unless the authorization is terminated  or revoked sooner.       Influenza A by PCR NEGATIVE NEGATIVE Final   Influenza B by PCR NEGATIVE NEGATIVE Final    Comment: (NOTE) The Xpert Xpress SARS-CoV-2/FLU/RSV plus assay is intended as an aid in the diagnosis of influenza from Nasopharyngeal swab  specimens and should not be used as a sole basis for treatment. Nasal washings and aspirates are unacceptable for Xpert Xpress SARS-CoV-2/FLU/RSV testing.  Fact Sheet for Patients: EntrepreneurPulse.com.au  Fact Sheet for Healthcare Providers: IncredibleEmployment.be  This test is not yet approved or cleared by the [place] FDA and has been authorized for detection and/or diagnosis of SARS-CoV-2 by FDA under an Emergency Use Authorization (EUA). This EUA will remain in effect (meaning this test can be used) for the duration of the COVID-19 declaration under Section 564(b)(1) of the Act, 21 U.S.C. section 360bbb-3(b)(1), unless the authorization is terminated or revoked.  Performed at [facility], [place] [address] [place], [place] [address]   MRSA Next Gen by PCR, Nasal     Status: None   Collection Time: 01/16/22  8:18 PM  Specimen: Peripheral; Nasal Swab  Result Value Ref Range Status   MRSA by PCR Next Gen NOT DETECTED NOT DETECTED Final    Comment: (NOTE) The GeneXpert MRSA Assay (FDA approved for NASAL specimens only), is one component of a comprehensive MRSA colonization surveillance program. It is not intended to diagnose MRSA infection nor to guide or monitor treatment for MRSA infections. Test performance is not FDA approved in patients less than 29 years old. Performed at [facility], [place] [address] [place], [place] [address]   Culture, blood (Routine X 2) w Reflex to ID Panel     Status: None   Collection Time: 01/16/22  9:52 PM   Specimen: BLOOD LEFT HAND  Result Value Ref Range Status   Specimen Description BLOOD LEFT HAND  Final   Special Requests AEROBIC BOTTLE ONLY Blood Culture adequate volume  Final   Culture   Final    NO GROWTH 5 DAYS Performed at [facility], [place] [address], [place], [place] [address]    Report Status 01/21/2022 FINAL  Final  Culture, blood (Routine X 2) w Reflex to ID  Panel     Status: None   Collection Time: 01/16/22  9:52 PM   Specimen: BLOOD LEFT HAND  Result Value Ref Range Status   Specimen Description BLOOD LEFT HAND  Final   Special Requests AEROBIC BOTTLE ONLY Blood Culture adequate volume  Final   Culture   Final    NO GROWTH 5 DAYS Performed at [facility], [place] [address] [place], [place] [address]    Report Status 01/21/2022 FINAL  Final    Labs: CBC: Recent Labs  Lab 01/31/22 1722 02/01/22 0640  WBC 9.8 11.6*  HGB 8.9* 8.1*  HCT 27.1* 24.8*  MCV 93.1 95.0  PLT 475* 341   Basic Metabolic Panel: Recent Labs  Lab 01/31/22 1722 02/01/22 0640  NA 137 139  K 6.1* 4.8  CL 111 114*  CO2 19* 16*  GLUCOSE 111* 131*  BUN 54* 50*  CREATININE 2.40* 2.22*  CALCIUM 8.8* 8.0*   Liver Function Tests: No results for input(s): "AST", "ALT", "ALKPHOS", "BILITOT", "PROT", "ALBUMIN" in the last 168 hours. CBG: Recent Labs  Lab 02/01/22 0207 02/01/22 0741 02/01/22 1147  GLUCAP 108* 137* 172*    Discharge time spent: greater than 30 minutes.  Signed: Tawni Millers, MD [facility] 02/01/2022

## 2022-02-01 NOTE — ED Notes (Signed)
RN printed off teaching material for Diabetes: meal planning, portion size, reading food label, and counting carbs.

## 2022-02-01 NOTE — ED Notes (Signed)
Provider at the bedside.  

## 2022-02-01 NOTE — ED Notes (Signed)
Pt eat breakfast at the bedside

## 2022-02-01 NOTE — Assessment & Plan Note (Addendum)
Patient with improvement in serum K, today is 4,8, with serum cr at 2.22, CL 114 and serum bicarbonate at 16 with anion gap of 9. P 4.1  Plan to continue holding ACE inh Add oral sodium bicarbonate for non anion gap metabolic acidosis.  Repeat one dose of 5 mg of sodium zirconium and plan to follow up renal function and electrolytes as outpatient.

## 2022-02-01 NOTE — ED Notes (Signed)
Pt inquired about being d/c today. RN notified MD.  MD will be down shortly to speak with pt.

## 2022-02-01 NOTE — Hospital Course (Signed)
Keith Garrett was admitted to the hospital with the working diagnosis of hyperkalemia.   56 yo male with the past medical history of hypertension, T2DM, and CKD who presented with hyperkalemia. Recent hospitalization on 01/16/22 to 01/24/22 for shock, requiring renal replacement therapy for 72 hrs, at the time of his discharge lisinopril and metformin were discontinued. Apparently patient continue taking lisinopril, at home. Outpatient follow up with his primary care showed hyperkalemia and he was referred to the hospital for further management.  On his initial physical examination his blood pressure was 151/75, HR 75, RR 19 and 02 saturation 99%, heart with S1 and S2 present and rhythmic, lungs with no rales or wheezing, abdomen not distended and no lower extremity edema.   Na 137, K 6,1 CL 111, bicarbonate 19, glucose 111, bun 54, cr 2.40 Wbc 9,8 hgb 8,9 plt 475   EKG 72 bpm, left axis deviation, normal intervals, sinus rhythm with J point elevation in V2 and V3, with no significant ST segment or T wave changes.   Patient received sodium zirconium, IV fluids, along with insulin and dextrose with improvement in serum K.  Renal function has remained stable.

## 2022-02-01 NOTE — ED Provider Notes (Signed)
[facility] EMERGENCY DEPARTMENT Provider Note   CSN: [id number] Arrival date & time: 01/31/22  1651     History  Chief Complaint  Patient presents with   hyperkalemia    Keith Garrett is a 56 y.o. male.  56 yo M who presents the ER today secondary to hyperkalemia.  He was recently in the hospital for acute renal failure on CRRT, intubated for shock and discharged about a week ago.  Had a follow-up appoint with his doctor and his potassium was reportedly 6.6.  Patient states he had a lipid achiness in his back otherwise has no complaints.  Specifically denies any chest pain, shortness of breath, palpitations, loss of consciousness.  States has been urinating fine.  Eating and drinking normally.        Home Medications Prior to Admission medications   Medication Sig Start Date End Date Taking? Authorizing Provider  amLODipine (NORVASC) 10 MG tablet Take 0.5 tablets (5 mg total) by mouth daily. 01/24/22   Ghimire, Henreitta Leber, MD  atorvastatin (LIPITOR) 40 MG tablet Take 1 tablet (40 mg total) by mouth daily. 04/20/20   Benay Pike, MD  dorzolamide-timolol (COSOPT) 22.3-6.8 MG/ML ophthalmic solution Place 1 drop into the left eye 2 (two) times daily.  12/27/19   [provider]  linagliptin (TRADJENTA) 5 MG TABS tablet Take 1 tablet (5 mg total) by mouth daily. 01/24/22   Ghimire, Henreitta Leber, MD      Allergies    Patient has no known allergies.    Review of Systems   Review of Systems  Physical Exam Updated Vital Signs BP 129/70   Pulse 71   Temp 98.9 F (37.2 C) (Oral)   Resp 13   SpO2 100%  Physical Exam Vitals and nursing note reviewed.  Constitutional:      Appearance: He is well-developed.  HENT:     Head: Normocephalic and atraumatic.     Mouth/Throat:     Mouth: Mucous membranes are moist.     Pharynx: Oropharynx is clear.  Eyes:     Pupils: Pupils are equal, round, and reactive to light.  Cardiovascular:     Rate and Rhythm:  Normal rate.  Pulmonary:     Effort: Pulmonary effort is normal. No respiratory distress.  Abdominal:     General: Abdomen is flat. There is no distension.  Musculoskeletal:        General: Normal range of motion.     Cervical back: Normal range of motion.  Skin:    General: Skin is warm and dry.  Neurological:     General: No focal deficit present.     Mental Status: He is alert.     ED Results / Procedures / Treatments   Labs (all labs ordered are listed, but only abnormal results are displayed) Labs Reviewed  BASIC METABOLIC PANEL - Abnormal; Notable for the following components:      Result Value   Potassium 6.1 (*)    CO2 19 (*)    Glucose, Bld 111 (*)    BUN 54 (*)    Creatinine, Ser 2.40 (*)    Calcium 8.8 (*)    GFR, Estimated 31 (*)    All other components within normal limits  CBC - Abnormal; Notable for the following components:   RBC 2.91 (*)    Hemoglobin 8.9 (*)    HCT 27.1 (*)    Platelets 475 (*)    All other components within normal limits  CBG MONITORING, ED - Abnormal; Notable for the following components:   Glucose-Capillary 108 (*)    All other components within normal limits  BASIC METABOLIC PANEL  BASIC METABOLIC PANEL  BASIC METABOLIC PANEL  CBC    EKG EKG Interpretation  Date/Time:  Saturday January 31 2022 16:57:08 EDT Ventricular Rate:  72 PR Interval:  170 QRS Duration: 94 QT Interval:  378 QTC Calculation: 413 R Axis:   -31 Text Interpretation: Normal sinus rhythm Left axis deviation Minimal voltage criteria for LVH, may be normal variant ( R in aVL ) Possible Lateral infarct , age undetermined Abnormal ECG When compared with ECG of 16-Jan-2022 17:06, PREVIOUS ECG IS PRESENT Confirmed by Merrily Pew [id number]) on 02/01/2022 12:12:39 AM  Radiology No results found.  Procedures Procedures    Medications Ordered in ED Medications  amLODipine (NORVASC) tablet 5 mg (has no administration in time range)  atorvastatin (LIPITOR)  tablet 40 mg (has no administration in time range)  dorzolamide-timolol (COSOPT) 22.3-6.8 MG/ML ophthalmic solution 1 drop (has no administration in time range)  insulin aspart (novoLOG) injection 0-6 Units (has no administration in time range)  heparin injection 5,000 Units (has no administration in time range)  sodium chloride flush (NS) 0.9 % injection 3 mL (3 mLs Intravenous Given 02/01/22 0205)  acetaminophen (TYLENOL) tablet 650 mg (has no administration in time range)    Or  acetaminophen (TYLENOL) suppository 650 mg (has no administration in time range)  ondansetron (ZOFRAN) tablet 4 mg (has no administration in time range)    Or  ondansetron (ZOFRAN) injection 4 mg (has no administration in time range)  0.9 %  sodium chloride infusion ( Intravenous New Bag/Given 02/01/22 0212)  sodium zirconium cyclosilicate (LOKELMA) packet 10 g (10 g Oral Given 02/01/22 0039)  sodium chloride 0.9 % bolus 1,000 mL (1,000 mLs Intravenous New Bag/Given 02/01/22 0051)  insulin aspart (novoLOG) injection 5 Units (5 Units Intravenous Given 02/01/22 0205)    And  dextrose 50 % solution 50 mL (50 mLs Intravenous Given 02/01/22 0204)  albuterol (PROVENTIL) (2.5 MG/3ML) 0.083% nebulizer solution 10 mg (10 mg Nebulization Given 02/01/22 0208)    ED Course/ Medical Decision Making/ A&P                           Medical Decision Making Amount and/or Complexity of Data Reviewed Labs: ordered.  Risk Prescription drug management. Decision regarding hospitalization.   Potassium here is 6.1.  No obvious EKG changes.  Lokelma and fluids ordered.  Discussed with hospitalist for admission.   Final Clinical Impression(s) / ED Diagnoses Final diagnoses:  Hyperkalemia    Rx / DC Orders ED Discharge Orders     None         Sherilee Smotherman, Corene Cornea, MD 02/01/22 [phone]

## 2022-02-02 NOTE — Telephone Encounter (Signed)
Called patient and made new appointment and address was given   Interpreter [phone]

## 2022-02-05 ENCOUNTER — Ambulatory Visit: Payer: Self-pay | Attending: Critical Care Medicine | Admitting: Critical Care Medicine

## 2022-02-05 ENCOUNTER — Encounter: Payer: Self-pay | Admitting: Critical Care Medicine

## 2022-02-05 VITALS — BP 154/89 | HR 75 | Ht 63.0 in | Wt 196.2 lb

## 2022-02-05 DIAGNOSIS — I2510 Atherosclerotic heart disease of native coronary artery without angina pectoris: Secondary | ICD-10-CM

## 2022-02-05 DIAGNOSIS — N184 Chronic kidney disease, stage 4 (severe): Secondary | ICD-10-CM

## 2022-02-05 DIAGNOSIS — G9389 Other specified disorders of brain: Secondary | ICD-10-CM

## 2022-02-05 DIAGNOSIS — R748 Abnormal levels of other serum enzymes: Secondary | ICD-10-CM

## 2022-02-05 DIAGNOSIS — R801 Persistent proteinuria, unspecified: Secondary | ICD-10-CM

## 2022-02-05 DIAGNOSIS — N183 Chronic kidney disease, stage 3 unspecified: Secondary | ICD-10-CM

## 2022-02-05 DIAGNOSIS — E778 Other disorders of glycoprotein metabolism: Secondary | ICD-10-CM

## 2022-02-05 DIAGNOSIS — B351 Tinea unguium: Secondary | ICD-10-CM

## 2022-02-05 DIAGNOSIS — E1142 Type 2 diabetes mellitus with diabetic polyneuropathy: Secondary | ICD-10-CM

## 2022-02-05 DIAGNOSIS — E785 Hyperlipidemia, unspecified: Secondary | ICD-10-CM

## 2022-02-05 DIAGNOSIS — E875 Hyperkalemia: Secondary | ICD-10-CM

## 2022-02-05 DIAGNOSIS — I2584 Coronary atherosclerosis due to calcified coronary lesion: Secondary | ICD-10-CM

## 2022-02-05 DIAGNOSIS — Z1211 Encounter for screening for malignant neoplasm of colon: Secondary | ICD-10-CM

## 2022-02-05 DIAGNOSIS — A419 Sepsis, unspecified organism: Secondary | ICD-10-CM

## 2022-02-05 DIAGNOSIS — E279 Disorder of adrenal gland, unspecified: Secondary | ICD-10-CM

## 2022-02-05 DIAGNOSIS — E1122 Type 2 diabetes mellitus with diabetic chronic kidney disease: Secondary | ICD-10-CM

## 2022-02-05 DIAGNOSIS — D649 Anemia, unspecified: Secondary | ICD-10-CM

## 2022-02-05 DIAGNOSIS — Z1159 Encounter for screening for other viral diseases: Secondary | ICD-10-CM

## 2022-02-05 DIAGNOSIS — I10 Essential (primary) hypertension: Secondary | ICD-10-CM

## 2022-02-05 DIAGNOSIS — I739 Peripheral vascular disease, unspecified: Secondary | ICD-10-CM

## 2022-02-05 DIAGNOSIS — G459 Transient cerebral ischemic attack, unspecified: Secondary | ICD-10-CM

## 2022-02-05 DIAGNOSIS — E278 Other specified disorders of adrenal gland: Secondary | ICD-10-CM

## 2022-02-05 DIAGNOSIS — E113553 Type 2 diabetes mellitus with stable proliferative diabetic retinopathy, bilateral: Secondary | ICD-10-CM

## 2022-02-05 DIAGNOSIS — R6521 Severe sepsis with septic shock: Secondary | ICD-10-CM

## 2022-02-05 DIAGNOSIS — M1A3211 Chronic gout due to renal impairment, right elbow, with tophus (tophi): Secondary | ICD-10-CM

## 2022-02-05 DIAGNOSIS — N179 Acute kidney failure, unspecified: Secondary | ICD-10-CM

## 2022-02-05 MED ORDER — AMLODIPINE BESYLATE 10 MG PO TABS: 10.0000 mg | ORAL_TABLET | Freq: Every day | ORAL | 2 refills | Status: DC

## 2022-02-05 MED ORDER — LINAGLIPTIN 5 MG PO TABS: 5.0000 mg | ORAL_TABLET | Freq: Every day | ORAL | 2 refills | Status: DC

## 2022-02-05 MED ORDER — SODIUM BICARBONATE 650 MG PO TABS: 650.0000 mg | ORAL_TABLET | Freq: Two times a day (BID) | ORAL | 1 refills | Status: DC

## 2022-02-05 MED ORDER — ALLOPURINOL 100 MG PO TABS: 100.0000 mg | ORAL_TABLET | Freq: Every day | ORAL | 6 refills | Status: DC

## 2022-02-05 MED ORDER — ASPIRIN 81 MG PO TBEC: 81.0000 mg | DELAYED_RELEASE_TABLET | Freq: Every day | ORAL | 12 refills | Status: AC

## 2022-02-05 MED ORDER — CARVEDILOL 12.5 MG PO TABS: 12.5000 mg | ORAL_TABLET | Freq: Two times a day (BID) | ORAL | 3 refills | Status: DC

## 2022-02-05 MED ORDER — HYDROCHLOROTHIAZIDE 25 MG PO TABS: 25.0000 mg | ORAL_TABLET | Freq: Every day | ORAL | 2 refills | Status: DC

## 2022-02-05 MED ORDER — ATORVASTATIN CALCIUM 40 MG PO TABS: 40.0000 mg | ORAL_TABLET | Freq: Every day | ORAL | 0 refills | Status: DC

## 2022-02-05 NOTE — Assessment & Plan Note (Signed)
Patient under care of retinal specialist

## 2022-02-05 NOTE — Assessment & Plan Note (Signed)
Septic shock has resolved

## 2022-02-05 NOTE — Assessment & Plan Note (Signed)
Potassium had been reduced last week we will reassess again today  Begin sodium bicarbonate twice daily 650 mg

## 2022-02-05 NOTE — Assessment & Plan Note (Signed)
Blood pressure not well controlled plan to begin beta-blocker carvedilol 12 and half milligrams twice daily and continue amlodipine daily and hydrochlorothiazide as prescribed note with renal dysfunction cannot use Aldactone or ARB ACE inhibitors

## 2022-02-05 NOTE — Assessment & Plan Note (Signed)
Reassessed c-Met

## 2022-02-05 NOTE — Assessment & Plan Note (Signed)
Reassess lipids and continue atorvastatin 40 mg daily

## 2022-02-05 NOTE — Assessment & Plan Note (Signed)
History of infection in the left foot and decreased pulses distally will refer to vascular surgery

## 2022-02-05 NOTE — Assessment & Plan Note (Signed)
Referral back to podiatry made

## 2022-02-05 NOTE — Assessment & Plan Note (Signed)
History of calcified coronary arteries will at some point need cardiology referral

## 2022-02-05 NOTE — Assessment & Plan Note (Signed)
Reassess blood count 

## 2022-02-05 NOTE — Assessment & Plan Note (Signed)
Stage IV kidney disease with hyperkalemia referral to nephrology was made we will recheck labs

## 2022-02-05 NOTE — Patient Instructions (Addendum)
Start carvedilol 1 twice daily Start aspirin 81 mg daily To start allopurinol 1 daily Start sodium bicarbonate twice daily Refills on other medications sent to your pharmacy  Referral to foot doctor, vascular doctor, and kidney doctor sent.  Complete screening labs obtained this visit and urine for microalbumin  Return to Dr. Joya Gaskins in 6 weeks see Lurena Joiner our clinical pharmacist in 4 weeks for your diabetes and high blood pressure  Keep your appointments with your retinal eye doctor and let us know the name and practice you are going     What nutrients should I limit? Work with your treatment team and a food expert (dietitian) to make a meal plan that's right for you. Foods you can eat and foods you should limit or avoid will depend on the stage of your kidney disease and any other health conditions you have. The items listed below are not a complete list. Talk with your dietitian to learn what is best for you. Potassium Potassium affects how steadily your heart beats. Too much potassium in your blood can cause an irregular heartbeat or even a heart attack. You may need to limit foods that are high in potassium, such as: Liquid milk and soy milk. Salt substitutes that contain potassium. Fruits like bananas, apricots, nectarines, melon, prunes, raisins, kiwi, and oranges. Vegetables, such as potatoes, sweet potatoes, yams, tomatoes, leafy greens, beets, avocado, pumpkin, and winter squash. Beans, like lima beans. Nuts. Phosphorus Phosphorus is a mineral found in your bones. You need a balance between calcium and phosphorus to build and maintain healthy bones. Too much added phosphorus from the foods you eat can pull calcium from your bones. Losing calcium can make your bones weak and more likely to break. Too much phosphorus can also make your skin itch. You may need to limit foods that are high in phosphorus or that have added phosphorus, such as: Liquid milk and dairy  products. Dark-colored sodas or soft drinks. Bran cereals and oatmeal.  Bran cereals and oatmeal. Protein Foods that are high in protein, including nuts, peanut butter, cheese, chicken, fish, beans, yogurt, and milk.   Protein helps you make and keep muscle. Protein also helps to repair your body's cells and tissues. One of the natural breakdown products of protein is a waste product called urea. When your kidneys are not working well, they cannot remove types of waste like urea. Reducing protein in your diet can help keep urea from building up in your blood. Depending on your stage of kidney disease, you may need to eat smaller portions of foods that are high in protein. Sources of animal protein include: Meat (all types). Fish and seafood. Poultry. Eggs. Dairy. Other protein foods include: Beans and legumes. Nuts and nut butter. Soy, like tofu.   Sodium Salt (sodium) helps to keep a healthy balance of fluids in your body. Too much salt can increase your blood pressure, which can harm your heart and lungs. Extra salt can also cause your body to keep too much fluid, making your kidneys work harder. You may need to limit or avoid foods that are high in salt, such as: Salt seasonings. Soy and teriyaki sauce. Packaged, precooked, cured, or processed meats, such as sausages or meat loaves. Sardines. Salted crackers and snack foods. Fast food. Canned soups and most canned foods. Pickled foods. Vegetable juice. Boxed mixes or ready-to-eat boxed meals and side dishes. Bottled dressings, sauces, and marinades

## 2022-02-05 NOTE — Assessment & Plan Note (Signed)
Reassess urine albumin

## 2022-02-05 NOTE — Assessment & Plan Note (Signed)
Type 2 diabetes at goal A1c 6.9 we will continue Tradjenta for now

## 2022-02-05 NOTE — Assessment & Plan Note (Signed)
Reassess labs

## 2022-02-05 NOTE — Progress Notes (Signed)
New Patient Office Visit  Subjective    Patient ID: Keith Garrett, male    DOB: 1965/09/29  Age: 56 y.o. MRN: [id number]  CC:  Chief Complaint  Patient presents with   New Patient (Initial Visit)    HPI Keith Garrett presents to establish care This patient is seen to establish care and is a 56 year old male complex medical history.  He has had type 2 diabetes for 26 years and hypertension for 10 years.  He immigrated from [place].  He has excellent English at this time.  Patient's had progression of his diabetes to the point where he had macular and retinal eye disease and is receiving injections and therapy by retinal specialist.  Patient progressed to the point where he was admitted to the hospital recently.  Below is copy of the discharge summary. Admit Date: 01/16/2022 Discharge date: 01/24/2022   Recommendations for Outpatient Follow-up:  Follow up with PCP in 1-2 weeks Please obtain CMP/CBC in one week Lisinopril/metformin/glipizide held-resume when renal function allows.   Admitted From:  Home   Disposition: Outpatient PT   Discharge Condition: good   CODE STATUS:   Code Status: Full Code    Diet recommendation:  Diet Order                  Diet - low sodium heart healthy             Diet heart healthy/carb modified Room service appropriate? Yes; Fluid consistency: Thin  Diet effective now                         Brief Summary: Patient is a 56 y.o.  male with history of HTN, DM-2-who developed vomiting after eating at a local [place] restaurant-he was subsequently feeling poorly-family found him unresponsive-EMS was called-he was brought to the hospital where he was found to have hypovolemic shock with AKI-severe encephalopathy-requiring intubation to protect airway.  He was subsequently admitted to the ICU-started on CRRT-stabilized and subsequently transferred to [facility] service.  See below for further details.   Significant events: 7/28>>  unresponsive-hypotensive-AKI-intubated-admitted to ICU. 8/02>> extubated 8/04>> transfer to [facility].   Significant studies: 7/28>> CT head: No acute intracranial abnormality 7/28>> CT C-spine: No fracture/subluxation 7/28>> renal ultrasound: No hydronephrosis   Significant microbiology data: 7/28>> COVID/influenza PCR: Negative 7/28>> blood culture: No growth   Procedures: 7/28-8/02>> ETT   Consults: PCCM, nephrology     Brief Hospital Course: Acute metabolic encephalopathy due to uremia/severe metabolic acidosis/hypovolemic shock: Resolved-awake and alert.   Acute hypoxic respiratory failure due to inability to protect airway in the setting of severe encephalopathy and aspiration PNA: Extubated on 8/2-titrated to room air.    Circulatory shock due to sepsis/hypovolemia (POA): Due to GI loss-aspiration PNA-shock physiology has resolved-BP stable.  Has completed a course of Unasyn and doxycycline.     Acute kidney injury: Likely ischemic ATN-required CRRT-renal function is gradually improving on its own-creatinine continues to downtrend with just supportive care-he is no longer on IVF.  Continue to avoid nephrotoxic agents.  Discussed with patient at length-he has a PCP-and I have recommended that he get a repeat electrolyte panel in 1 week.  Foley catheter was removed yesterday-and he is voiding on his own.  Severe metabolic acidosis: Due to AKI-resolved.   Hyponatremia/hypokalemia/hypocalcemia: Better-we will continue to replete K today.   Normocytic anemia: Due to acute illness/AKI-no indications to transfuse at this point.  Follow CBC periodically.   Leukocytosis: Due to  possible aspiration pneumonia-follow.   HTN: BP slowly creeping up-we will resume amlodipine and continue to hold lisinopril.     DM-2 (A1c 6.9): CBG stable on SSI-we will continue to hold glipizide/metformin-and transition to [place] on discharge.  Follow with PCP for further optimization.   Patient  returned on August 12 for additional treatment for hyperkalemia.  He he is yet to achieve follow-up with nephrology  Patient also has peripheral artery disease but has not been of a by vascular surgery.  Patient also had prior history of infection in the left foot which is resolved but has significant onychomycosis.  No prior history of TIA but is not taking any aspirin therapy.  On arrival blood pressure was 154/89.  A1c at the emergency room last week was 6.9.  He is on Tradjenta orally alone.  He is not yet on insulin.  He does monitor his blood sugars at home and they stay in the 130-100 range.  He states he has no low numbers.  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  He has not had treatment for this or assessments of his uric acid. Outpatient Encounter Medications as of 02/05/2022  Medication Sig   allopurinol (ZYLOPRIM) 100 MG tablet Take 1 tablet (100 mg total) by mouth daily.   aspirin EC 81 MG tablet Take 1 tablet (81 mg total) by mouth daily. Swallow whole.   carvedilol (COREG) 12.5 MG tablet Take 1 tablet (12.5 mg total) by mouth 2 (two) times daily with a meal.   sodium bicarbonate 650 MG tablet Take 1 tablet (650 mg total) by mouth 2 (two) times daily.   [DISCONTINUED] amLODipine (NORVASC) 10 MG tablet Take 0.5 tablets (5 mg total) by mouth daily. (Patient taking differently: Take 10 mg by mouth daily.)   [DISCONTINUED] atorvastatin (LIPITOR) 40 MG tablet Take 1 tablet (40 mg total) by mouth daily.   [DISCONTINUED] hydrochlorothiazide (HYDRODIURIL) 25 MG tablet Take 25 mg by mouth daily.   [DISCONTINUED] linagliptin (TRADJENTA) 5 MG TABS tablet Take 1 tablet (5 mg total) by mouth daily.   amLODipine (NORVASC) 10 MG tablet Take 1 tablet (10 mg total) by mouth daily.   atorvastatin (LIPITOR) 40 MG tablet Take 1 tablet (40 mg total) by mouth daily.   hydrochlorothiazide (HYDRODIURIL) 25 MG tablet Take 1 tablet (25 mg total) by mouth daily.    linagliptin (TRADJENTA) 5 MG TABS tablet Take 1 tablet (5 mg total) by mouth daily.   No facility-administered encounter medications on file as of 02/05/2022.    Past Medical History:  Diagnosis Date   Cellulitis of foot, left 04/13/2020   CKD (chronic kidney disease) stage 4, GFR 15-29 ml/min (HCC) 12/2021   per chart review Crt 2.74, GFR 26 on 01/03/2022   Diabetes mellitus without complication ([place])    Hypertension    Osteomyelitis of ankle or foot, acute, left ([place]) 04/19/2020   Traumatic rupture of plantar fascia of left foot 04/14/2020    Past Surgical History:  Procedure Laterality Date   ABDOMINAL AORTOGRAM W/LOWER EXTREMITY N/A 04/17/2020   Procedure: ABDOMINAL AORTOGRAM W/LOWER EXTREMITY;  Surgeon: Marty Heck, MD;  Location: [place] [facility];  Service: Cardiovascular;  Laterality: N/A;   TEE WITHOUT CARDIOVERSION N/A 04/16/2020   Procedure: TRANSESOPHAGEAL ECHOCARDIOGRAM (TEE);  Surgeon: [name], MD;  Location: [facility] END[facility]COPY;  Service: Cardiovascular;  Laterality: N/A;    Family History  Problem Relation Age of Onset   Diabetes Mother  Social History   Socioeconomic History   Marital status: Married    Spouse name: Not on file   Number of children: Not on file   Years of education: Not on file   Highest education level: Not on file  Occupational History   Not on file  Tobacco Use   Smoking status: Never   Smokeless tobacco: Never  Vaping Use   Vaping Use: Never used  Substance and Sexual Activity   Alcohol use: Not Currently    Comment: occasionally   Drug use: No   Sexual activity: Not Currently  Other Topics Concern   Not on file  Social History Narrative   Not on file   Social Determinants of Health   Financial Resource Strain: Not on file  Food Insecurity: Not on file  Transportation Needs: Not on file  Physical Activity: Not on file  Stress: Not on file  Social Connections: Not on file  Intimate Partner Violence:  Not on file    Review of Systems  Constitutional:  Negative for chills, diaphoresis, fever, malaise/fatigue and weight loss.  HENT:  Negative for congestion, hearing loss, nosebleeds, sore throat and tinnitus.   Eyes:  Negative for blurred vision, photophobia and redness.  Respiratory:  Negative for cough, hemoptysis, sputum production, shortness of breath, wheezing and stridor.   Cardiovascular:  Negative for chest pain, palpitations, orthopnea, claudication, leg swelling and PND.  Gastrointestinal:  Negative for abdominal pain, blood in stool, constipation, diarrhea, heartburn, nausea and vomiting.  Genitourinary:  Negative for dysuria, flank pain, frequency, hematuria and urgency.  Musculoskeletal:  Positive for joint pain. Negative for back pain, falls, myalgias and neck pain.  Skin:  Negative for itching and rash.  Neurological:  Positive for weakness. Negative for dizziness, tingling, tremors, sensory change, speech change, focal weakness, seizures, loss of consciousness and headaches.  Endo/Heme/Allergies:  Negative for environmental allergies and polydipsia. Does not bruise/bleed easily.  Psychiatric/Behavioral:  Negative for depression, memory loss, substance abuse and suicidal ideas. The patient is not nervous/anxious and does not have insomnia.         Objective    BP (!) 154/89   Pulse 75   Ht $R'5\' 3"'dL$  (1.6 m)   Wt 196 lb 3.2 oz (89 kg)   SpO2 98%   BMI 34.76 kg/m   Physical Exam Vitals reviewed.  Constitutional:      Appearance: Normal appearance. He is well-developed. He is obese. He is not diaphoretic.  HENT:     Head: Normocephalic and atraumatic.     Nose: No nasal deformity, septal deviation, mucosal edema or rhinorrhea.     Right Sinus: No maxillary sinus tenderness or frontal sinus tenderness.     Left Sinus: No maxillary sinus tenderness or frontal sinus tenderness.     Mouth/Throat:     Pharynx: No oropharyngeal exudate.     Comments: Poor dentition Eyes:      General: No scleral icterus.    Conjunctiva/sclera: Conjunctivae normal.     Pupils: Pupils are equal, round, and reactive to light.  Neck:     Thyroid: No thyromegaly.     Vascular: No carotid bruit or JVD.     Trachea: Trachea normal. No tracheal tenderness or tracheal deviation.  Cardiovascular:     Rate and Rhythm: Normal rate and regular rhythm.     Chest Wall: PMI is not displaced.     Pulses: No decreased pulses.     Heart sounds: Normal heart sounds, S1 normal and S2  normal. Heart sounds not distant. No murmur heard.    No systolic murmur is present.     No diastolic murmur is present.     No friction rub. No gallop. No S3 or S4 sounds.     Comments: Decrease DP PT pulses bilaterally in both feet Pulmonary:     Effort: No tachypnea, accessory muscle usage or respiratory distress.     Breath sounds: No stridor. No decreased breath sounds, wheezing, rhonchi or rales.  Chest:     Chest wall: No tenderness.  Abdominal:     General: Bowel sounds are normal. There is no distension.     Palpations: Abdomen is soft. Abdomen is not rigid.     Tenderness: There is no abdominal tenderness. There is no guarding or rebound.  Musculoskeletal:        General: Normal range of motion.     Cervical back: Normal range of motion and neck supple. No edema, erythema or rigidity. No muscular tenderness. Normal range of motion.  Lymphadenopathy:     Head:     Right side of head: No submental or submandibular adenopathy.     Left side of head: No submental or submandibular adenopathy.     Cervical: No cervical adenopathy.  Skin:    General: Skin is warm and dry.     Coloration: Skin is not pale.     Findings: No rash.     Nails: There is no clubbing.  Neurological:     Mental Status: He is alert and oriented to person, place, and time.     Sensory: No sensory deficit.  Psychiatric:        Speech: Speech normal.        Behavior: Behavior normal.         Assessment & Plan:    Problem List Items Addressed This Visit       Cardiovascular and Mediastinum   PVD (peripheral vascular disease) ([place])    History of infection in the left foot and decreased pulses distally will refer to vascular surgery      Relevant Medications   amLODipine (NORVASC) 10 MG tablet   atorvastatin (LIPITOR) 40 MG tablet   hydrochlorothiazide (HYDRODIURIL) 25 MG tablet   carvedilol (COREG) 12.5 MG tablet   aspirin EC 81 MG tablet   Other Relevant Orders   Ambulatory referral to Vascular Surgery   Transient ischemic attack    Transient ischemic attack in the past he needs to stay on aspirin we will have him resume 81 mg daily we need to reassess his lipid panel needs more intensive statin therapy      Relevant Medications   amLODipine (NORVASC) 10 MG tablet   atorvastatin (LIPITOR) 40 MG tablet   hydrochlorothiazide (HYDRODIURIL) 25 MG tablet   carvedilol (COREG) 12.5 MG tablet   aspirin EC 81 MG tablet   Hypertension    Blood pressure not well controlled plan to begin beta-blocker carvedilol 12 and half milligrams twice daily and continue amlodipine daily and hydrochlorothiazide as prescribed note with renal dysfunction cannot use Aldactone or ARB ACE inhibitors      Relevant Medications   amLODipine (NORVASC) 10 MG tablet   atorvastatin (LIPITOR) 40 MG tablet   hydrochlorothiazide (HYDRODIURIL) 25 MG tablet   carvedilol (COREG) 12.5 MG tablet   aspirin EC 81 MG tablet   Other Relevant Orders   CBC with Differential/Platelet   Calcification of coronary artery    History of calcified coronary arteries will at some  point need cardiology referral      Relevant Medications   amLODipine (NORVASC) 10 MG tablet   atorvastatin (LIPITOR) 40 MG tablet   hydrochlorothiazide (HYDRODIURIL) 25 MG tablet   carvedilol (COREG) 12.5 MG tablet   aspirin EC 81 MG tablet     Endocrine   Diabetic polyneuropathy associated with type 2 diabetes mellitus (HCC)    History of neuropathy  will monitor      Relevant Medications   atorvastatin (LIPITOR) 40 MG tablet   linagliptin (TRADJENTA) 5 MG TABS tablet   aspirin EC 81 MG tablet   Diabetic retinopathy ([place])    Patient under care of retinal specialist      Relevant Medications   atorvastatin (LIPITOR) 40 MG tablet   linagliptin (TRADJENTA) 5 MG TABS tablet   aspirin EC 81 MG tablet   Type II diabetes mellitus with stage 3 chronic kidney disease (HCC)    Type 2 diabetes at goal A1c 6.9 we will continue Tradjenta for now      Relevant Medications   atorvastatin (LIPITOR) 40 MG tablet   linagliptin (TRADJENTA) 5 MG TABS tablet   aspirin EC 81 MG tablet   Other Relevant Orders   Urine microalbumin-creatinine with uACR   Comprehensive metabolic panel with eGFR (no eGFR if sent to [facility])   Ambulatory referral to Podiatry     Nervous and Auditory   Encephalomalacia    Prior strokes noted again have restarted aspirin therapy        Musculoskeletal and Integument   Onychomycosis of toenail    Referral back to podiatry made        Genitourinary   Chronic kidney disease, stage IV (severe) ([place])    Stage IV kidney disease with hyperkalemia referral to nephrology was made we will recheck labs      AKI (acute kidney injury) ([place])    Was improving at last ER but we will reassess with a      Relevant Orders   Ambulatory referral to Nephrology     Other   Proteinuria    Reassess urine albumin      Alkaline phosphatase raised    Reassessed c-Met      Dyslipidemia    Reassess lipids and continue atorvastatin 40 mg daily      Relevant Medications   atorvastatin (LIPITOR) 40 MG tablet   Hemoglobin low    Reassess blood count      Hyperkalemia    Potassium had been reduced last week we will reassess again today  Begin sodium bicarbonate twice daily 650 mg      Relevant Orders   Ambulatory referral to Nephrology   Hypoproteinemia ([place])    Reassess labs      RESOLVED: Septic shock ([place])     Septic shock has resolved      Other Visit Diagnoses     CKD (chronic kidney disease) stage 4, GFR 15-29 ml/min ([place])    -  Primary   Relevant Orders   Ambulatory referral to Nephrology   Adrenal nodule (HCC)   (Chronic)     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   Asencion Noble, MD

## 2022-02-05 NOTE — Assessment & Plan Note (Signed)
History of neuropathy will monitor

## 2022-02-05 NOTE — Assessment & Plan Note (Signed)
Prior strokes noted again have restarted aspirin therapy

## 2022-02-05 NOTE — Assessment & Plan Note (Signed)
Was improving at last ER but we will reassess with a

## 2022-02-05 NOTE — Assessment & Plan Note (Signed)
Transient ischemic attack in the past he needs to stay on aspirin we will have him resume 81 mg daily we need to reassess his lipid panel needs more intensive statin therapy

## 2022-02-06 ENCOUNTER — Other Ambulatory Visit: Payer: Self-pay

## 2022-02-06 ENCOUNTER — Telehealth: Payer: Self-pay

## 2022-02-06 ENCOUNTER — Other Ambulatory Visit: Payer: Self-pay | Admitting: Critical Care Medicine

## 2022-02-06 MED ORDER — LOKELMA 10 G PO PACK: 10.0000 g | PACK | Freq: Every day | ORAL | 0 refills | Status: DC

## 2022-02-06 MED ORDER — LOKELMA 10 G PO PACK
10.0000 g | PACK | Freq: Every day | ORAL | 0 refills | Status: AC
  Filled 2022-02-06: qty 10, 10d supply, fill #0

## 2022-02-06 NOTE — Progress Notes (Signed)
I could not get [facility] so I sent it to our pharmacy downstairs and also pt needs to follow renal diet I put it on his aVS this morning perhaps read some of that to him or ask him to log on mychart account

## 2022-02-06 NOTE — Telephone Encounter (Signed)
-----   Message from Elsie Stain, MD sent at 02/06/2022  8:34 AM EDT ----- [facility] downstairs and also pt needs to follow renal diet I put it on his aVS this morning perhaps read some of that to him or ask him to log on mychart account

## 2022-02-06 NOTE — Telephone Encounter (Signed)
-----   Message from Elsie Stain, MD sent at 02/06/2022  8:13 AM EDT ----- Let pt know potassium is still too high.  Start bicarbonate as directed.   Keep /get kidney doctor appt,  I sent a Rx for [facility] to our pharmacy that will lower the potassium. [facility]  Blood counts better, uric acid normal, take allopurinol as ordered, hep C neg

## 2022-02-06 NOTE — Addendum Note (Signed)
Addended by: Elsie Stain on: 02/06/2022 08:31 AM   Modules accepted: Orders

## 2022-02-06 NOTE — Progress Notes (Signed)
Let pt know potassium is still too high.  Start bicarbonate as directed.   Keep /get kidney doctor appt,  I sent a Rx for [facility] to our pharmacy that will lower the potassium. [facility]  Blood counts better, uric acid normal, take allopurinol as ordered, hep C neg

## 2022-02-06 NOTE — Progress Notes (Signed)
Lokelma sent to pharmacy

## 2022-02-06 NOTE — Telephone Encounter (Signed)
Pt was called and vm was left, Information has been sent to nurse pool.   

## 2022-02-06 NOTE — Telephone Encounter (Signed)
Called patient to go over labs results (second call) and left voicemail.

## 2022-02-07 LAB — CBC WITH DIFFERENTIAL/PLATELET
Basophils Absolute: 0.1 10*3/uL (ref 0.0–0.2)
Basos: 1 %
EOS (ABSOLUTE): 0.5 10*3/uL — ABNORMAL HIGH (ref 0.0–0.4)
Eos: 5 %
Hematocrit: 29.2 % — ABNORMAL LOW (ref 37.5–51.0)
Hemoglobin: 9.8 g/dL — ABNORMAL LOW (ref 13.0–17.7)
Immature Grans (Abs): 0 10*3/uL (ref 0.0–0.1)
Immature Granulocytes: 0 %
Lymphocytes Absolute: 1.5 10*3/uL (ref 0.7–3.1)
Lymphs: 17 %
MCH: 31.1 pg (ref 26.6–33.0)
MCHC: 33.6 g/dL (ref 31.5–35.7)
MCV: 93 fL (ref 79–97)
Monocytes Absolute: 0.6 10*3/uL (ref 0.1–0.9)
Monocytes: 6 %
Neutrophils Absolute: 6.2 10*3/uL (ref 1.4–7.0)
Neutrophils: 71 %
Platelets: 461 10*3/uL — ABNORMAL HIGH (ref 150–450)
RBC: 3.15 x10E6/uL — ABNORMAL LOW (ref 4.14–5.80)
RDW: 13.5 % (ref 11.6–15.4)
WBC: 8.7 10*3/uL (ref 3.4–10.8)

## 2022-02-07 LAB — COMPREHENSIVE METABOLIC PANEL
ALT: 18 IU/L (ref 0–44)
AST: 23 IU/L (ref 0–40)
Albumin/Globulin Ratio: 1.4 (ref 1.2–2.2)
Albumin: 4.1 g/dL (ref 3.8–4.9)
Alkaline Phosphatase: 127 IU/L — ABNORMAL HIGH (ref 44–121)
BUN/Creatinine Ratio: 17 (ref 9–20)
BUN: 45 mg/dL — ABNORMAL HIGH (ref 6–24)
Bilirubin Total: <0.2 mg/dL (ref 0.0–1.2)
CO2: 19 mmol/L — ABNORMAL LOW (ref 20–29)
Calcium: 9.4 mg/dL (ref 8.7–10.2)
Chloride: 102 mmol/L (ref 96–106)
Creatinine, Ser: 2.7 mg/dL — ABNORMAL HIGH (ref 0.76–1.27)
Globulin, Total: 2.9 g/dL (ref 1.5–4.5)
Glucose: 162 mg/dL — ABNORMAL HIGH (ref 70–99)
Potassium: 6 mmol/L — ABNORMAL HIGH (ref 3.5–5.2)
Sodium: 135 mmol/L (ref 134–144)
Total Protein: 7 g/dL (ref 6.0–8.5)
eGFR: 27 mL/min/{1.73_m2} — ABNORMAL LOW (ref >59)

## 2022-02-07 LAB — MICROALBUMIN / CREATININE URINE RATIO
Creatinine, Urine: 84.6 mg/dL
Microalb/Creat Ratio: 348 mg/g creat — ABNORMAL HIGH (ref 0–29)
Microalbumin, Urine: 294.7 ug/mL

## 2022-02-07 LAB — HCV INTERPRETATION

## 2022-02-07 LAB — HCV AB W REFLEX TO QUANT PCR: HCV Ab: NONREACTIVE

## 2022-02-07 LAB — URIC ACID: Uric Acid: 7.1 mg/dL (ref 3.8–8.4)

## 2022-02-24 ENCOUNTER — Other Ambulatory Visit: Payer: Self-pay

## 2022-02-24 ENCOUNTER — Telehealth: Payer: Self-pay

## 2022-02-24 NOTE — Telephone Encounter (Signed)
Patient has been enrolled in the [employer] PAP for Lokelma until 02/21/2023.  Medication will be shipped to patient's home address, initial rx for 1 month supply with 0 additional refills. Questions, AZ[employer]ME [id number].  Patient ID: [id number]

## 2022-02-28 ENCOUNTER — Other Ambulatory Visit: Payer: Self-pay | Admitting: Critical Care Medicine

## 2022-03-13 ENCOUNTER — Ambulatory Visit: Payer: Self-pay | Admitting: Pharmacist

## 2022-03-17 ENCOUNTER — Other Ambulatory Visit: Payer: Self-pay

## 2022-03-23 ENCOUNTER — Ambulatory Visit: Payer: Self-pay | Admitting: Critical Care Medicine

## 2022-03-25 ENCOUNTER — Other Ambulatory Visit: Payer: Self-pay | Admitting: *Deleted

## 2022-03-25 DIAGNOSIS — I739 Peripheral vascular disease, unspecified: Secondary | ICD-10-CM

## 2022-03-29 NOTE — Progress Notes (Signed)
New Patient Office Visit  Subjective    Patient ID: Keith Garrett, male    DOB: 09/10/1965  Age: 56 y.o. MRN: [id number]  CC:  No chief complaint on file.   HPI 01/2022 Keith Garrett presents to establish care This patient is seen to establish care and is a 56 year old male complex medical history.  He has had type 2 diabetes for 26 years and hypertension for 10 years.  He immigrated from [place].  He has excellent English at this time.  Patient's had progression of his diabetes to the point where he had macular and retinal eye disease and is receiving injections and therapy by retinal specialist.  Patient progressed to the point where he was admitted to the hospital recently.  Below is copy of the discharge summary. Admit Date: 01/16/2022 Discharge date: 01/24/2022   Recommendations for Outpatient Follow-up:  Follow up with PCP in 1-2 weeks Please obtain CMP/CBC in one week Lisinopril/metformin/glipizide held-resume when renal function allows.   Admitted From:  Home   Disposition: Outpatient PT   Discharge Condition: good   CODE STATUS:   Code Status: Full Code    Diet recommendation:  Diet Order                  Diet - low sodium heart healthy             Diet heart healthy/carb modified Room service appropriate? Yes; Fluid consistency: Thin  Diet effective now                         Brief Summary: Patient is a 56 y.o.  male with history of HTN, DM-2-who developed vomiting after eating at a local [place] restaurant-he was subsequently feeling poorly-family found him unresponsive-EMS was called-he was brought to the hospital where he was found to have hypovolemic shock with AKI-severe encephalopathy-requiring intubation to protect airway.  He was subsequently admitted to the ICU-started on CRRT-stabilized and subsequently transferred to [facility] service.  See below for further details.   Significant events: 7/28>> unresponsive-hypotensive-AKI-intubated-admitted to  ICU. 8/02>> extubated 8/04>> transfer to [facility].   Significant studies: 7/28>> CT head: No acute intracranial abnormality 7/28>> CT C-spine: No fracture/subluxation 7/28>> renal ultrasound: No hydronephrosis   Significant microbiology data: 7/28>> COVID/influenza PCR: Negative 7/28>> blood culture: No growth   Procedures: 7/28-8/02>> ETT   Consults: PCCM, nephrology     Brief Hospital Course: Acute metabolic encephalopathy due to uremia/severe metabolic acidosis/hypovolemic shock: Resolved-awake and alert.   Acute hypoxic respiratory failure due to inability to protect airway in the setting of severe encephalopathy and aspiration PNA: Extubated on 8/2-titrated to room air.    Circulatory shock due to sepsis/hypovolemia (POA): Due to GI loss-aspiration PNA-shock physiology has resolved-BP stable.  Has completed a course of Unasyn and doxycycline.     Acute kidney injury: Likely ischemic ATN-required CRRT-renal function is gradually improving on its own-creatinine continues to downtrend with just supportive care-he is no longer on IVF.  Continue to avoid nephrotoxic agents.  Discussed with patient at length-he has a PCP-and I have recommended that he get a repeat electrolyte panel in 1 week.  Foley catheter was removed yesterday-and he is voiding on his own.  Severe metabolic acidosis: Due to AKI-resolved.   Hyponatremia/hypokalemia/hypocalcemia: Better-we will continue to replete K today.   Normocytic anemia: Due to acute illness/AKI-no indications to transfuse at this point.  Follow CBC periodically.   Leukocytosis: Due to possible aspiration pneumonia-follow.   HTN: BP  slowly creeping up-we will resume amlodipine and continue to hold lisinopril.     DM-2 (A1c 6.9): CBG stable on SSI-we will continue to hold glipizide/metformin-and transition to [place] on discharge.  Follow with PCP for further optimization.   Patient returned on August 12 for additional treatment for  hyperkalemia.  He he is yet to achieve follow-up with nephrology  Patient also has peripheral artery disease but has not been of a by vascular surgery.  Patient also had prior history of infection in the left foot which is resolved but has significant onychomycosis.  No prior history of TIA but is not taking any aspirin therapy.  On arrival blood pressure was 154/89.  A1c at the emergency room last week was 6.9.  He is on Tradjenta orally alone.  He is not yet on insulin.  He does monitor his blood sugars at home and they stay in the 130-100 range.  He states he has no low numbers.  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  He has not had treatment for this or assessments of his uric acid.  10/10 PVD (peripheral vascular disease) ([place])   History of infection in the left foot and decreased pulses distally will refer to vascular surgery     Relevant Medications  amLODipine (NORVASC) 10 MG tablet  atorvastatin (LIPITOR) 40 MG tablet  hydrochlorothiazide (HYDRODIURIL) 25 MG tablet  carvedilol (COREG) 12.5 MG tablet  aspirin EC 81 MG tablet  Other Relevant Orders  Ambulatory referral to Vascular Surgery  Transient ischemic attack   Transient ischemic attack in the past he needs to stay on aspirin we will have him resume 81 mg daily we need to reassess his lipid panel needs more intensive statin therapy     Relevant Medications  amLODipine (NORVASC) 10 MG tablet  atorvastatin (LIPITOR) 40 MG tablet  hydrochlorothiazide (HYDRODIURIL) 25 MG tablet  carvedilol (COREG) 12.5 MG tablet  aspirin EC 81 MG tablet  Hypertension   Blood pressure not well controlled plan to begin beta-blocker carvedilol 12 and half milligrams twice daily and continue amlodipine daily and hydrochlorothiazide as prescribed note with renal dysfunction cannot use Aldactone or ARB ACE inhibitors     Relevant Medications  amLODipine (NORVASC) 10 MG tablet  atorvastatin (LIPITOR) 40  MG tablet  hydrochlorothiazide (HYDRODIURIL) 25 MG tablet  carvedilol (COREG) 12.5 MG tablet  aspirin EC 81 MG tablet  Other Relevant Orders  CBC with Differential/Platelet  Calcification of coronary artery   History of calcified coronary arteries will at some point need cardiology referral     Relevant Medications  amLODipine (NORVASC) 10 MG tablet  atorvastatin (LIPITOR) 40 MG tablet  hydrochlorothiazide (HYDRODIURIL) 25 MG tablet  carvedilol (COREG) 12.5 MG tablet  aspirin EC 81 MG tablet  Endocrine  Diabetic polyneuropathy associated with type 2 diabetes mellitus (HCC)   History of neuropathy will monitor     Relevant Medications  atorvastatin (LIPITOR) 40 MG tablet  linagliptin (TRADJENTA) 5 MG TABS tablet  aspirin EC 81 MG tablet  Diabetic retinopathy (HCC)   Patient under care of retinal specialist     Relevant Medications  atorvastatin (LIPITOR) 40 MG tablet  linagliptin (TRADJENTA) 5 MG TABS tablet  aspirin EC 81 MG tablet  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 allopurinol (ZYLOPRIM) 100 MG tablet Take 1 tablet (100 mg total) by mouth daily.  Marland Kitchen amLODipine (NORVASC) 10 MG tablet Take 1 tablet (10 mg total) by mouth daily.  Marland Kitchen aspirin EC 81 MG tablet Take 1 tablet (81 mg total) by mouth daily. Swallow whole.  Marland Kitchen atorvastatin (LIPITOR) 40 MG tablet Take 1 tablet (40 mg total) by mouth daily.  . carvedilol (COREG) 12.5 MG tablet Take 1 tablet (12.5 mg total) by mouth 2 (two) times daily with a meal.  . hydrochlorothiazide (HYDRODIURIL) 25 MG tablet Take 1 tablet (25 mg total) by mouth daily.  Marland Kitchen linagliptin (TRADJENTA) 5 MG TABS tablet Take 1 tablet (5 mg total) by mouth daily.  . sodium bicarbonate 650 MG tablet TAKE 1 TABLET BY MOUTH 2 TIMES DAILY.   No facility-administered encounter  medications on file as of 03/31/2022.    Past Medical History:  Diagnosis Date  . Cellulitis of foot, left 04/13/2020  . CKD (chronic kidney disease) stage 4, GFR 15-29 ml/min (HCC) 12/2021   per chart review Crt 2.74, GFR 26 on 01/03/2022  . Diabetes mellitus without complication ([place])   . Hypertension   . Osteomyelitis of ankle or foot, acute, left ([place]) 04/19/2020  . Traumatic rupture of plantar fascia of left foot 04/14/2020    Past Surgical History:  Procedure Laterality Date  . ABDOMINAL AORTOGRAM W/LOWER EXTREMITY N/A 04/17/2020   Procedure: ABDOMINAL AORTOGRAM W/LOWER EXTREMITY;  Surgeon: Marty Heck, MD;  Location: [place] [facility];  Service: Cardiovascular;  Laterality: N/A;  . TEE WITHOUT CARDIOVERSION N/A 04/16/2020   Procedure: TRANSESOPHAGEAL ECHOCARDIOGRAM (TEE);  Surgeon: [name], MD;  Location: [facility] ENDOSCOPY;  Service: Cardiovascular;  Laterality: N/A;    Family History  Problem Relation Age of Onset  . Diabetes Mother     Social History   Socioeconomic History  . Marital status: Married    Spouse name: Not on file  . Number of children: Not on file  . Years of education: Not on file  . Highest education level: Not on file  Occupational History  . Not on file  Tobacco Use  . Smoking status: Never  . Smokeless tobacco: Never  Vaping Use  . Vaping Use: Never used  Substance and Sexual Activity  . Alcohol use: Not Currently    Comment: occasionally  . Drug use: No  . Sexual activity: Not Currently  Other Topics Concern  . Not on file  Social History Narrative  . Not on file   Social Determinants of Health   Financial Resource Strain: Not on file  Food Insecurity: Not on file  Transportation Needs: Not on file  Physical Activity: Not on file  Stress: Not on file  Social Connections: Not on file  Intimate Partner Violence: Not on file    Review of Systems  Constitutional:  Negative for chills, diaphoresis, fever,  malaise/fatigue and weight loss.  HENT:  Negative for congestion, hearing loss, nosebleeds, sore throat and tinnitus.   Eyes:  Negative for blurred vision, photophobia and redness.  Respiratory:  Negative for cough, hemoptysis, sputum production, shortness of breath, wheezing and stridor.   Cardiovascular:  Negative for chest pain, palpitations, orthopnea, claudication, leg swelling and PND.  Gastrointestinal:  Negative for abdominal pain, blood in stool, constipation, diarrhea, heartburn, nausea and vomiting.  Genitourinary:  Negative for dysuria, flank pain, frequency, hematuria and urgency.  Musculoskeletal:  Positive for joint pain. Negative for back pain, falls, myalgias and neck pain.  Skin:  Negative for itching and rash.  Neurological:  Positive for weakness. Negative for dizziness, tingling, tremors, sensory change, speech change, focal weakness, seizures, loss of consciousness and headaches.  Endo/Heme/Allergies:  Negative for environmental allergies and polydipsia. Does not bruise/bleed easily.  Psychiatric/Behavioral:  Negative for depression, memory loss, substance abuse and suicidal ideas. The patient is not nervous/anxious and does not have insomnia.         Objective    There were no vitals taken for this visit.  Physical Exam Vitals reviewed.  Constitutional:      Appearance: Normal appearance. He is well-developed. He is obese. He is not diaphoretic.  HENT:     Head: Normocephalic and atraumatic.     Nose: No nasal deformity, septal deviation, mucosal edema or rhinorrhea.     Right Sinus: No maxillary sinus tenderness or frontal sinus tenderness.     Left Sinus: No maxillary sinus tenderness or frontal sinus tenderness.     Mouth/Throat:     Pharynx: No oropharyngeal exudate.     Comments: Poor dentition Eyes:     General: No scleral icterus.    Conjunctiva/sclera: Conjunctivae normal.     Pupils: Pupils are equal, round, and reactive to light.  Neck:      Thyroid: No thyromegaly.     Vascular: No carotid bruit or JVD.     Trachea: Trachea normal. No tracheal tenderness or tracheal deviation.  Cardiovascular:     Rate and Rhythm: Normal rate and regular rhythm.     Chest Wall: PMI is not displaced.     Pulses: No decreased pulses.     Heart sounds: Normal heart sounds, S1 normal and S2 normal. Heart sounds not distant. No murmur heard.    No systolic murmur is present.     No diastolic murmur is present.     No friction rub. No gallop. No S3 or S4 sounds.     Comments: Decrease DP PT pulses bilaterally in both feet Pulmonary:     Effort: No tachypnea, accessory muscle usage or respiratory distress.     Breath sounds: No stridor. No decreased breath sounds, wheezing, rhonchi or rales.  Chest:     Chest wall: No tenderness.  Abdominal:     General: Bowel sounds are normal. There is no distension.     Palpations: Abdomen is soft. Abdomen is not rigid.     Tenderness: There is no abdominal tenderness. There is no guarding or rebound.  Musculoskeletal:        General: Normal range of motion.     Cervical back: Normal range of motion and neck supple. No edema, erythema or rigidity. No muscular tenderness. Normal range of motion.  Lymphadenopathy:     Head:     Right side of head: No submental or submandibular adenopathy.     Left side of head: No submental or submandibular adenopathy.     Cervical: No cervical adenopathy.  Skin:    General: Skin is warm and dry.     Coloration: Skin is not pale.     Findings: No rash.     Nails: There is no clubbing.  Neurological:     Mental Status: He is alert and oriented to person, place, and time.     Sensory: No sensory deficit.  Psychiatric:        Speech: Speech normal.        Behavior: Behavior normal.        Assessment & Plan:  Problem List Items Addressed This Visit   None 1 hour spent assessing patient reviewing records complex decision making high No follow-ups on file.    Asencion Noble, MD

## 2022-03-30 ENCOUNTER — Ambulatory Visit (HOSPITAL_COMMUNITY)
Admission: RE | Admit: 2022-03-30 | Discharge: 2022-03-30 | Disposition: A | Discharge status: Home / Self Care | Payer: Self-pay | Source: Ambulatory Visit | Attending: Surgery | Admitting: Surgery

## 2022-03-30 DIAGNOSIS — I739 Peripheral vascular disease, unspecified: Secondary | ICD-10-CM | POA: Insufficient documentation

## 2022-03-31 ENCOUNTER — Ambulatory Visit: Payer: Self-pay | Admitting: Vascular Surgery

## 2022-03-31 ENCOUNTER — Encounter: Payer: Self-pay | Admitting: Critical Care Medicine

## 2022-03-31 ENCOUNTER — Other Ambulatory Visit (HOSPITAL_COMMUNITY): Payer: Self-pay

## 2022-03-31 ENCOUNTER — Other Ambulatory Visit: Payer: Self-pay

## 2022-03-31 ENCOUNTER — Ambulatory Visit: Payer: Self-pay | Attending: Critical Care Medicine | Admitting: Critical Care Medicine

## 2022-03-31 VITALS — BP 136/77 | HR 59 | Ht 63.0 in | Wt 198.4 lb

## 2022-03-31 DIAGNOSIS — Z1211 Encounter for screening for malignant neoplasm of colon: Secondary | ICD-10-CM

## 2022-03-31 DIAGNOSIS — N1832 Chronic kidney disease, stage 3b: Secondary | ICD-10-CM

## 2022-03-31 DIAGNOSIS — E1122 Type 2 diabetes mellitus with diabetic chronic kidney disease: Secondary | ICD-10-CM

## 2022-03-31 DIAGNOSIS — N184 Chronic kidney disease, stage 4 (severe): Secondary | ICD-10-CM

## 2022-03-31 DIAGNOSIS — G459 Transient cerebral ischemic attack, unspecified: Secondary | ICD-10-CM

## 2022-03-31 DIAGNOSIS — I10 Essential (primary) hypertension: Secondary | ICD-10-CM

## 2022-03-31 DIAGNOSIS — I739 Peripheral vascular disease, unspecified: Secondary | ICD-10-CM

## 2022-03-31 MED ORDER — HYDROCHLOROTHIAZIDE 25 MG PO TABS
25.0000 mg | ORAL_TABLET | Freq: Every day | ORAL | 2 refills | Status: DC
  Filled 2022-03-31: qty 90, 90d supply, fill #0

## 2022-03-31 MED ORDER — BLOOD PRESSURE KIT DEVI
0 refills | Status: AC
  Filled 2022-03-31: qty 1, 1d supply, fill #0

## 2022-03-31 MED ORDER — TRUEPLUS LANCETS 28G MISC
1 refills | Status: DC
  Filled 2022-03-31: qty 100, 50d supply, fill #0

## 2022-03-31 MED ORDER — AMLODIPINE BESYLATE 10 MG PO TABS
10.0000 mg | ORAL_TABLET | Freq: Every day | ORAL | 2 refills | Status: DC
  Filled 2022-03-31: qty 90, 90d supply, fill #0

## 2022-03-31 MED ORDER — ATORVASTATIN CALCIUM 40 MG PO TABS
40.0000 mg | ORAL_TABLET | Freq: Every day | ORAL | 0 refills | Status: DC
  Filled 2022-03-31: qty 30, 30d supply, fill #0

## 2022-03-31 MED ORDER — TRUE METRIX METER W/DEVICE KIT
PACK | 0 refills | Status: DC
  Filled 2022-03-31: qty 1, 30d supply, fill #0

## 2022-03-31 MED ORDER — ALLOPURINOL 100 MG PO TABS
100.0000 mg | ORAL_TABLET | Freq: Every day | ORAL | 6 refills | Status: DC
  Filled 2022-03-31: qty 30, 30d supply, fill #0

## 2022-03-31 MED ORDER — LINAGLIPTIN 5 MG PO TABS
5.0000 mg | ORAL_TABLET | Freq: Every day | ORAL | 2 refills | Status: DC
  Filled 2022-03-31: qty 30, 30d supply, fill #0

## 2022-03-31 MED ORDER — TRUE METRIX BLOOD GLUCOSE TEST VI STRP
ORAL_STRIP | 12 refills | Status: DC
  Filled 2022-03-31: qty 100, 50d supply, fill #0

## 2022-03-31 MED ORDER — CARVEDILOL 12.5 MG PO TABS
12.5000 mg | ORAL_TABLET | Freq: Two times a day (BID) | ORAL | 3 refills | Status: DC
  Filled 2022-03-31: qty 60, 30d supply, fill #0

## 2022-03-31 MED ORDER — SODIUM BICARBONATE 650 MG PO TABS
650.0000 mg | ORAL_TABLET | Freq: Two times a day (BID) | ORAL | 1 refills | Status: DC
  Filled 2022-03-31: qty 60, 30d supply, fill #0

## 2022-03-31 NOTE — Patient Instructions (Signed)
No change in blood pressure medicines for now refill sent to our pharmacy downstairs.  No other medication changes all medicines sent to the pharmacy downstairs  COVID booster [facility]  You declined the flu shot  Labs today include metabolic panel check your kidney function  Return to Dr. Joya Gaskins 4 months

## 2022-03-31 NOTE — Assessment & Plan Note (Signed)
Reassess renal function and potassium

## 2022-03-31 NOTE — Assessment & Plan Note (Signed)
As per TIA

## 2022-03-31 NOTE — Assessment & Plan Note (Signed)
Blood pressure at goal continue with current blood pressure medications

## 2022-03-31 NOTE — Assessment & Plan Note (Signed)
Continue with statin and aspirin

## 2022-03-31 NOTE — Assessment & Plan Note (Signed)
No change in medications will reassess A1c and other labs reassess renal function and potassium

## 2022-04-01 LAB — BMP8+EGFR
BUN/Creatinine Ratio: 20 (ref 9–20)
BUN: 36 mg/dL — ABNORMAL HIGH (ref 6–24)
CO2: 21 mmol/L (ref 20–29)
Calcium: 9 mg/dL (ref 8.7–10.2)
Chloride: 101 mmol/L (ref 96–106)
Creatinine, Ser: 1.84 mg/dL — ABNORMAL HIGH (ref 0.76–1.27)
Glucose: 248 mg/dL — ABNORMAL HIGH (ref 70–99)
Potassium: 4.9 mmol/L (ref 3.5–5.2)
Sodium: 136 mmol/L (ref 134–144)
eGFR: 42 mL/min/{1.73_m2} — ABNORMAL LOW (ref >59)

## 2022-04-01 NOTE — Progress Notes (Signed)
Let pt know kidneys are improved  potassium now normal

## 2022-04-02 ENCOUNTER — Other Ambulatory Visit: Payer: Self-pay

## 2022-04-07 ENCOUNTER — Telehealth: Payer: Self-pay

## 2022-04-07 ENCOUNTER — Other Ambulatory Visit: Payer: Self-pay

## 2022-04-07 NOTE — Telephone Encounter (Signed)
-----   Message from Elsie Stain, MD sent at 04/01/2022  5:40 AM EDT ----- Let pt know kidneys are improved  potassium now normal

## 2022-04-07 NOTE — Telephone Encounter (Signed)
Pt was called and vm was left, Information has been sent to nurse pool.    Interpreter id [phone]

## 2022-04-09 ENCOUNTER — Other Ambulatory Visit (HOSPITAL_COMMUNITY): Payer: Self-pay

## 2022-04-14 ENCOUNTER — Other Ambulatory Visit: Payer: Self-pay
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
## 2022-05-04 ENCOUNTER — Other Ambulatory Visit: Payer: Self-pay | Admitting: Critical Care Medicine

## 2022-05-05 NOTE — Telephone Encounter (Signed)
Refilled 03/31/2022 #[id number] rf. Requested Prescriptions  Pending Prescriptions Disp Refills   carvedilol (COREG) 12.5 MG tablet [Pharmacy Med Name: CARVEDILOL 12.5 MG TABLET] 180 tablet 1    Sig: TOME UNA TABLETA (12.5MG  TOTAL) POR VIA ORAL DOS VECES AL DIA CON LAS COMIDAS     Cardiovascular: Beta Blockers 3 Failed - 05/04/2022  2:15 AM      Failed - Cr in normal range and within 360 days    Creat  Date Value Ref Range Status  08/16/2020 1.18 0.70 - 1.33 mg/dL Final    Comment:    For patients >33 years of age, the reference limit for Creatinine is approximately 13% higher for people identified as African-American. .    Creatinine, Ser  Date Value Ref Range Status  03/31/2022 1.84 (H) 0.76 - 1.27 mg/dL Final   Creatinine, Urine  Date Value Ref Range Status  04/14/2020 89.55 mg/dL Final         Passed - AST in normal range and within 360 days    AST  Date Value Ref Range Status  02/05/2022 23 0 - 40 IU/L Final         Passed - ALT in normal range and within 360 days    ALT  Date Value Ref Range Status  02/05/2022 18 0 - 44 IU/L Final         Passed - Last BP in normal range    BP Readings from Last 1 Encounters:  03/31/22 136/77         Passed - Last Heart Rate in normal range    Pulse Readings from Last 1 Encounters:  03/31/22 (!) 69         Passed - Valid encounter within last 6 months    Recent Outpatient Visits           1 month ago Type 2 diabetes mellitus with stage 3b chronic kidney disease, without long-term current use of insulin ([place])   [place] Elsie Stain, MD   2 months ago CKD (chronic kidney disease) stage 4, GFR 15-29 ml/min [facility])   [place] Elsie Stain, MD       Future Appointments             In 3 months Joya Gaskins Burnett Harry, MD [place]

## 2022-05-11 ENCOUNTER — Other Ambulatory Visit: Payer: Self-pay | Admitting: Critical Care Medicine

## 2022-05-11 NOTE — Telephone Encounter (Signed)
Requested Prescriptions  Pending Prescriptions Disp Refills   sodium bicarbonate 650 MG tablet [Pharmacy Med Name: SODIUM BICARB 650 MG TABLET] 180 tablet 1    Sig: TOME UNA TABLETA DOS VECES AL DIA     Endocrinology:  Minerals 2 Passed - 05/11/2022  2:12 AM      Passed - Mg Level in normal range and within 180 days    Magnesium  Date Value Ref Range Status  01/18/2022 1.9 1.7 - 2.4 mg/dL Final    Comment:    Performed at [facility], [address] [place], [place] [address]         Passed - Valid encounter within last 12 months    Recent Outpatient Visits           1 month ago Type 2 diabetes mellitus with stage 3b chronic kidney disease, without long-term current use of insulin (HCC)   [facility] Wright, Patrick E, MD   3 months ago CKD (chronic kidney disease) stage 4, GFR 15-29 ml/min (HCC)   [facility] Wright, Patrick E, MD       Future Appointments             In 2 months Wright, Patrick E, MD [facility]            Passed - BMP within normal limits in the last 6 months    Glucose  Date Value Ref Range Status  03/31/2022 248 (H) 70 - 99 mg/dL Final   Glucose, Bld  Date Value Ref Range Status  02/01/2022 131 (H) 70 - 99 mg/dL Final    Comment:    Glucose reference range applies only to samples taken after fasting for at least 8 hours.   Glucose-Capillary  Date Value Ref Range Status  02/01/2022 172 (H) 70 - 99 mg/dL Final    Comment:    Glucose reference range applies only to samples taken after fasting for at least 8 hours.   Calcium  Date Value Ref Range Status  03/31/2022 9.0 8.7 - 10.2 mg/dL Final   Calcium, Ion  Date Value Ref Range Status  01/20/2022 1.04 (L) 1.15 - 1.40 mmol/L Final   Sodium  Date Value Ref Range Status  03/31/2022 136 134 - 144 mmol/L Final   Potassium  Date Value Ref Range Status  03/31/2022 4.9 3.5 - 5.2 mmol/L Final    Chloride  Date Value Ref Range Status  03/31/2022 101 96 - 106 mmol/L Final   BUN  Date Value Ref Range Status  03/31/2022 36 (H) 6 - 24 mg/dL Final   Creat  Date Value Ref Range Status  08/16/2020 1.18 0.70 - 1.33 mg/dL Final    Comment:    For patients >49 years of age, the reference limit for Creatinine is approximately 13% higher for people identified as African-American. .    Creatinine, Ser  Date Value Ref Range Status  03/31/2022 1.84 (H) 0.76 - 1.27 mg/dL Final   Creatinine, Urine  Date Value Ref Range Status  04/14/2020 89.55 mg/dL Final   CO2  Date Value Ref Range Status  03/31/2022 21 20 - 29 mmol/L Final   Bicarbonate  Date Value Ref Range Status  01/20/2022 25.9 20.0 - 28.0 mmol/L Final   TCO2  Date Value Ref Range Status  01/20/2022 27 22 - 32 mmol/L Final   GFR calc Af Amer  Date Value Ref Range Status    09/21/2009  >60 mL/min Final   >60        The eGFR has been calculated using the MDRD equation. This calculation has not been validated in all clinical situations. eGFR's persistently <60 mL/min signify possible Chronic Kidney Disease.   eGFR  Date Value Ref Range Status  03/31/2022 42 (L) >59 mL/min/1.73 Final   GFR, Estimated  Date Value Ref Range Status  02/01/2022 34 (L) >60 mL/min Final    Comment:    (NOTE) Calculated using the CKD-EPI Creatinine Equation (2021)

## 2022-08-02 NOTE — Progress Notes (Unsigned)
Established patient Office Visit  Subjective    Patient ID: Azaria Orlandini, male    DOB: [birth year]  Age: 57 y.o. MRN: [id number]  CC:  No chief complaint on file.   HPI 01/2022 Talmage Horry presents to establish care This patient is seen to establish care and is a 57 year old male complex medical history.  He has had type 2 diabetes for 26 years and hypertension for 10 years.  He immigrated from [place].  He has excellent English at this time.  Patient's had progression of his diabetes to the point where he had macular and retinal eye disease and is receiving injections and therapy by retinal specialist.  Patient progressed to the point where he was admitted to the hospital recently.  Below is copy of the discharge summary. Admit Date: 01/16/2022 Discharge date: 01/24/2022   Recommendations for Outpatient Follow-up:  Follow up with PCP in 1-2 weeks Please obtain CMP/CBC in one week Lisinopril/metformin/glipizide held-resume when renal function allows.   Admitted From:  Home   Disposition: Outpatient PT   Discharge Condition: good   CODE STATUS:   Code Status: Full Code    Diet recommendation:  Diet Order                  Diet - low sodium heart healthy             Diet heart healthy/carb modified Room service appropriate? Yes; Fluid consistency: Thin  Diet effective now                         Brief Summary: Patient is a 57 y.o.  male with history of HTN, DM-2-who developed vomiting after eating at a local [place] restaurant-he was subsequently feeling poorly-family found him unresponsive-EMS was called-he was brought to the hospital where he was found to have hypovolemic shock with AKI-severe encephalopathy-requiring intubation to protect airway.  He was subsequently admitted to the ICU-started on CRRT-stabilized and subsequently transferred to [facility] service.  See below for further details.   Significant events: 7/28>>  unresponsive-hypotensive-AKI-intubated-admitted to ICU. 8/02>> extubated 8/04>> transfer to [facility].   Significant studies: 7/28>> CT head: No acute intracranial abnormality 7/28>> CT C-spine: No fracture/subluxation 7/28>> renal ultrasound: No hydronephrosis   Significant microbiology data: 7/28>> COVID/influenza PCR: Negative 7/28>> blood culture: No growth   Procedures: 7/28-8/02>> ETT   Consults: PCCM, nephrology     Brief Hospital Course: Acute metabolic encephalopathy due to uremia/severe metabolic acidosis/hypovolemic shock: Resolved-awake and alert.   Acute hypoxic respiratory failure due to inability to protect airway in the setting of severe encephalopathy and aspiration PNA: Extubated on 8/2-titrated to room air.    Circulatory shock due to sepsis/hypovolemia (POA): Due to GI loss-aspiration PNA-shock physiology has resolved-BP stable.  Has completed a course of Unasyn and doxycycline.     Acute kidney injury: Likely ischemic ATN-required CRRT-renal function is gradually improving on its own-creatinine continues to downtrend with just supportive care-he is no longer on IVF.  Continue to avoid nephrotoxic agents.  Discussed with patient at length-he has a PCP-and I have recommended that he get a repeat electrolyte panel in 1 week.  Foley catheter was removed yesterday-and he is voiding on his own.  Severe metabolic acidosis: Due to AKI-resolved.   Hyponatremia/hypokalemia/hypocalcemia: Better-we will continue to replete K today.   Normocytic anemia: Due to acute illness/AKI-no indications to transfuse at this point.  Follow CBC periodically.   Leukocytosis: Due to possible aspiration pneumonia-follow.   HTN: BP  slowly creeping up-we will resume amlodipine and continue to hold lisinopril.     DM-2 (A1c 6.9): CBG stable on SSI-we will continue to hold glipizide/metformin-and transition to Tribbey on discharge.  Follow with PCP for further optimization.   Patient  returned on August 12 for additional treatment for hyperkalemia.  He he is yet to achieve follow-up with nephrology  Patient also has peripheral artery disease but has not been of a by vascular surgery.  Patient also had prior history of infection in the left foot which is resolved but has significant onychomycosis.  No prior history of TIA but is not taking any aspirin therapy.  On arrival blood pressure was 154/89.  A1c at the emergency room last week was 6.9.  He is on Tradjenta orally alone.  He is not yet on insulin.  He does monitor his blood sugars at home and they stay in the 130-100 range.  He states he has no low numbers.  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  He has not had treatment for this or assessments of his uric acid.  10/10 Patient returns in follow-up and speaks good [place].  Patient's blood pressure 136/77 today he does not monitor blood pressure at home.  He remains uninsured.  Patient has yet to see vascular surgery.  He had a TIA in the past remains on aspirin.  Blood pressure is better.  He remains on amlodipine hydrochlorothiazide carvedilol.  Patient also on the Lipitor at 40 mg daily.  He needs follow-up of his blood pressure.  And lipids  Potassium was elevated at the last visit was a to be rechecked.  The patient also needs colon cancer screening  08/04/22 PCP f/u  A1C colonfobt flu Outpatient Encounter Medications as of 08/04/2022  Medication Sig   allopurinol (ZYLOPRIM) 100 MG tablet Take 1 tablet (100 mg total) by mouth daily.   amLODipine (NORVASC) 10 MG tablet Take 1 tablet (10 mg total) by mouth daily.   aspirin EC 81 MG tablet Take 1 tablet (81 mg total) by mouth daily. Swallow whole.   atorvastatin (LIPITOR) 40 MG tablet Take 1 tablet (40 mg total) by mouth daily.   Blood Glucose Monitoring Suppl (TRUE METRIX METER) w/Device KIT Use to measure blood sugar twice a day   Blood Pressure Monitoring (BLOOD PRESSURE KIT)  DEVI Use to measure blood pressure   carvedilol (COREG) 12.5 MG tablet Take 1 tablet (12.5 mg total) by mouth 2 (two) times daily with a meal.   glucose blood (TRUE METRIX BLOOD GLUCOSE TEST) test strip Use as instructed   hydrochlorothiazide (HYDRODIURIL) 25 MG tablet Take 1 tablet (25 mg total) by mouth daily.   linagliptin (TRADJENTA) 5 MG TABS tablet Take 1 tablet (5 mg total) by mouth daily.   sodium bicarbonate 650 MG tablet TOME UNA TABLETA DOS VECES AL DIA   TRUEplus Lancets 28G MISC Use to measure blood sugar twice a day   No facility-administered encounter medications on file as of 08/04/2022.    Past Medical History:  Diagnosis Date   Cellulitis of foot, left 04/13/2020   CKD (chronic kidney disease) stage 4, GFR 15-29 ml/min (HCC) 12/2021   per chart review Crt 2.74, GFR 26 on 01/03/2022   Diabetes mellitus without complication ([place])    Hypertension    Osteomyelitis of ankle or foot, acute, left ([place]) 04/19/2020   Traumatic rupture of plantar fascia of left foot 04/14/2020    Past Surgical  History:  Procedure Laterality Date   ABDOMINAL AORTOGRAM W/LOWER EXTREMITY N/A 04/17/2020   Procedure: ABDOMINAL AORTOGRAM W/LOWER EXTREMITY;  Surgeon: Marty Heck, MD;  Location: [place] CV LAB;  Service: Cardiovascular;  Laterality: N/A;   TEE WITHOUT CARDIOVERSION N/A 04/16/2020   Procedure: TRANSESOPHAGEAL ECHOCARDIOGRAM (TEE);  Surgeon: [name], MD;  Location: [facility] ENDOSCOPY;  Service: Cardiovascular;  Laterality: N/A;    Family History  Problem Relation Age of Onset   Diabetes Mother     Social History   Socioeconomic History   Marital status: Married    Spouse name: Not on file   Number of children: Not on file   Years of education: Not on file   Highest education level: Not on file  Occupational History   Not on file  Tobacco Use   Smoking status: Never   Smokeless tobacco: Never  Vaping Use   Vaping Use: Never used  Substance and  Sexual Activity   Alcohol use: Not Currently    Comment: occasionally   Drug use: No   Sexual activity: Not Currently  Other Topics Concern   Not on file  Social History Narrative   Not on file   Social Determinants of Health   Financial Resource Strain: Not on file  Food Insecurity: Not on file  Transportation Needs: Not on file  Physical Activity: Not on file  Stress: Not on file  Social Connections: Not on file  Intimate Partner Violence: Not on file    Review of Systems  Constitutional:  Negative for chills, diaphoresis, fever, malaise/fatigue and weight loss.  HENT:  Negative for congestion, hearing loss, nosebleeds, sore throat and tinnitus.   Eyes:  Negative for blurred vision, photophobia and redness.  Respiratory:  Negative for cough, hemoptysis, sputum production, shortness of breath, wheezing and stridor.   Cardiovascular:  Negative for chest pain, palpitations, orthopnea, claudication, leg swelling and PND.  Gastrointestinal:  Negative for abdominal pain, blood in stool, constipation, diarrhea, heartburn, nausea and vomiting.  Genitourinary:  Negative for dysuria, flank pain, frequency, hematuria and urgency.  Musculoskeletal:  Positive for joint pain. Negative for back pain, falls, myalgias and neck pain.  Skin:  Negative for itching and rash.  Neurological:  Positive for weakness. Negative for dizziness, tingling, tremors, sensory change, speech change, focal weakness, seizures, loss of consciousness and headaches.  Endo/Heme/Allergies:  Negative for environmental allergies and polydipsia. Does not bruise/bleed easily.  Psychiatric/Behavioral:  Negative for depression, memory loss, substance abuse and suicidal ideas. The patient is not nervous/anxious and does not have insomnia.         Objective    There were no vitals taken for this visit.  Physical Exam Vitals reviewed.  Constitutional:      Appearance: Normal appearance. He is well-developed. He is  obese. He is not diaphoretic.  HENT:     Head: Normocephalic and atraumatic.     Nose: No nasal deformity, septal deviation, mucosal edema or rhinorrhea.     Right Sinus: No maxillary sinus tenderness or frontal sinus tenderness.     Left Sinus: No maxillary sinus tenderness or frontal sinus tenderness.     Mouth/Throat:     Pharynx: No oropharyngeal exudate.     Comments: Poor dentition Eyes:     General: No scleral icterus.    Conjunctiva/sclera: Conjunctivae normal.     Pupils: Pupils are equal, round, and reactive to light.  Neck:     Thyroid: No thyromegaly.     Vascular: No carotid  bruit or JVD.     Trachea: Trachea normal. No tracheal tenderness or tracheal deviation.  Cardiovascular:     Rate and Rhythm: Normal rate and regular rhythm.     Chest Wall: PMI is not displaced.     Pulses: No decreased pulses.     Heart sounds: Normal heart sounds, S1 normal and S2 normal. Heart sounds not distant. No murmur heard.    No systolic murmur is present.     No diastolic murmur is present.     No friction rub. No gallop. No S3 or S4 sounds.     Comments: Decrease DP PT pulses bilaterally in both feet Pulmonary:     Effort: No tachypnea, accessory muscle usage or respiratory distress.     Breath sounds: No stridor. No decreased breath sounds, wheezing, rhonchi or rales.  Chest:     Chest wall: No tenderness.  Abdominal:     General: Bowel sounds are normal. There is no distension.     Palpations: Abdomen is soft. Abdomen is not rigid.     Tenderness: There is no abdominal tenderness. There is no guarding or rebound.  Musculoskeletal:        General: Normal range of motion.     Cervical back: Normal range of motion and neck supple. No edema, erythema or rigidity. No muscular tenderness. Normal range of motion.  Lymphadenopathy:     Head:     Right side of head: No submental or submandibular adenopathy.     Left side of head: No submental or submandibular adenopathy.     Cervical:  No cervical adenopathy.  Skin:    General: Skin is warm and dry.     Coloration: Skin is not pale.     Findings: No rash.     Nails: There is no clubbing.  Neurological:     Mental Status: He is alert and oriented to person, place, and time.     Sensory: No sensory deficit.  Psychiatric:        Speech: Speech normal.        Behavior: Behavior normal.         Assessment & Plan:   Problem List Items Addressed This Visit   None 38 min spent assessing patient reviewing records complex decision making high No follow-ups on file.   Asencion Noble, MD

## 2022-08-04 ENCOUNTER — Encounter: Payer: Self-pay | Admitting: Critical Care Medicine

## 2022-08-04 ENCOUNTER — Other Ambulatory Visit: Payer: Self-pay

## 2022-08-04 ENCOUNTER — Ambulatory Visit: Payer: Self-pay | Attending: Critical Care Medicine | Admitting: Critical Care Medicine

## 2022-08-04 VITALS — BP 128/74 | HR 68 | Temp 98.0°F | Resp 16 | Ht 59.84 in | Wt 195.0 lb

## 2022-08-04 DIAGNOSIS — N1832 Chronic kidney disease, stage 3b: Secondary | ICD-10-CM

## 2022-08-04 DIAGNOSIS — N184 Chronic kidney disease, stage 4 (severe): Secondary | ICD-10-CM

## 2022-08-04 DIAGNOSIS — E785 Hyperlipidemia, unspecified: Secondary | ICD-10-CM

## 2022-08-04 DIAGNOSIS — E113553 Type 2 diabetes mellitus with stable proliferative diabetic retinopathy, bilateral: Secondary | ICD-10-CM

## 2022-08-04 DIAGNOSIS — E1122 Type 2 diabetes mellitus with diabetic chronic kidney disease: Secondary | ICD-10-CM

## 2022-08-04 DIAGNOSIS — I739 Peripheral vascular disease, unspecified: Secondary | ICD-10-CM

## 2022-08-04 DIAGNOSIS — I10 Essential (primary) hypertension: Secondary | ICD-10-CM

## 2022-08-04 DIAGNOSIS — Z1211 Encounter for screening for malignant neoplasm of colon: Secondary | ICD-10-CM

## 2022-08-04 DIAGNOSIS — E778 Other disorders of glycoprotein metabolism: Secondary | ICD-10-CM

## 2022-08-04 DIAGNOSIS — Z23 Encounter for immunization: Secondary | ICD-10-CM

## 2022-08-04 LAB — POCT GLYCOSYLATED HEMOGLOBIN (HGB A1C): HbA1c, POC (controlled diabetic range): 9.7 % — AB (ref 0.0–7.0)

## 2022-08-04 LAB — GLUCOSE, POCT (MANUAL RESULT ENTRY): POC Glucose: 335 mg/dl — AB (ref 70–99)

## 2022-08-04 MED ORDER — ATORVASTATIN CALCIUM 40 MG PO TABS
40.0000 mg | ORAL_TABLET | Freq: Every day | ORAL | 3 refills | Status: DC
  Filled 2022-08-04: qty 90, 90d supply, fill #0
  Filled 2022-10-26: qty 30, 30d supply, fill #1

## 2022-08-04 MED ORDER — SODIUM BICARBONATE 650 MG PO TABS
ORAL_TABLET | ORAL | 1 refills | Status: DC
  Filled 2022-08-04: qty 180, 90d supply, fill #0
  Filled 2022-10-26: qty 60, 30d supply, fill #1

## 2022-08-04 MED ORDER — ALLOPURINOL 100 MG PO TABS
100.0000 mg | ORAL_TABLET | Freq: Every day | ORAL | 6 refills | Status: DC
  Filled 2022-08-04: qty 90, 90d supply, fill #0

## 2022-08-04 MED ORDER — TRULICITY 0.75 MG/0.5ML ~~LOC~~ SOAJ
0.7500 mg | SUBCUTANEOUS | 4 refills | Status: DC
  Filled 2022-08-04: qty 2, 28d supply, fill #0
  Filled 2022-08-28: qty 2, 28d supply, fill #1
  Filled 2022-09-29: qty 2, 28d supply, fill #2
  Filled 2022-10-26: qty 2, 28d supply, fill #3

## 2022-08-04 MED ORDER — AMLODIPINE BESYLATE 10 MG PO TABS
10.0000 mg | ORAL_TABLET | Freq: Every day | ORAL | 2 refills | Status: DC
  Filled 2022-08-04: qty 90, 90d supply, fill #0
  Filled 2022-10-26: qty 30, 30d supply, fill #1

## 2022-08-04 MED ORDER — CARVEDILOL 12.5 MG PO TABS
12.5000 mg | ORAL_TABLET | Freq: Two times a day (BID) | ORAL | 3 refills | Status: DC
  Filled 2022-08-04: qty 180, 90d supply, fill #0
  Filled 2022-10-26: qty 60, 30d supply, fill #1

## 2022-08-04 MED ORDER — HYDROCHLOROTHIAZIDE 25 MG PO TABS
25.0000 mg | ORAL_TABLET | Freq: Every day | ORAL | 2 refills | Status: DC
  Filled 2022-08-04: qty 90, 90d supply, fill #0
  Filled 2022-10-26: qty 30, 30d supply, fill #1

## 2022-08-04 NOTE — Assessment & Plan Note (Signed)
Continue with statin therapy 

## 2022-08-04 NOTE — Assessment & Plan Note (Signed)
Now has stage III chronic kidney disease this has resolved

## 2022-08-04 NOTE — Assessment & Plan Note (Signed)
Renal function improved will enroll patient in the liberate trial and he was seen by clinical pharmacy to get his CGM monitor placed and will partner with his son to get the app on his apple phone  Patient will start Trulicity A999333 mg weekly and he will stop Tradjenta see clinical pharmacy short-term

## 2022-08-04 NOTE — Assessment & Plan Note (Signed)
Blood pressure under good control at this visit we will continue with amlodipine hydrochlorothiazide and carvedilol and check metabolic panel

## 2022-08-04 NOTE — Assessment & Plan Note (Signed)
Continue with lipid therapy

## 2022-08-04 NOTE — Assessment & Plan Note (Signed)
Patient has follow-up with eye

## 2022-08-04 NOTE — Patient Instructions (Addendum)
Reviewed handout and follow lifestyle medicine approach on handout  You met with Keith Garrett our clinical pharmacist to have the continuous meter placed on your arm he will educate you on this and you will return to see him in the next 3 weeks for further medication management  I am not can refill the Tradjenta for right now you may yet need to be on other medication : start trulicity once weekly   All other medications were refilled sent to our pharmacy  Flu vaccine was given  You will go to the lab to have a metabolic panel and kidney function checked upon discharge  Return to see Dr. Joya Gaskins 2 months

## 2022-08-05 LAB — BMP8+EGFR
BUN/Creatinine Ratio: 21 — ABNORMAL HIGH (ref 9–20)
BUN: 38 mg/dL — ABNORMAL HIGH (ref 6–24)
CO2: 20 mmol/L (ref 20–29)
Calcium: 8.9 mg/dL (ref 8.7–10.2)
Chloride: 100 mmol/L (ref 96–106)
Creatinine, Ser: 1.78 mg/dL — ABNORMAL HIGH (ref 0.76–1.27)
Glucose: 373 mg/dL — ABNORMAL HIGH (ref 70–99)
Potassium: 4.2 mmol/L (ref 3.5–5.2)
Sodium: 137 mmol/L (ref 134–144)
eGFR: 44 mL/min/{1.73_m2} — ABNORMAL LOW (ref >59)

## 2022-08-08 ENCOUNTER — Other Ambulatory Visit: Payer: Self-pay | Admitting: Critical Care Medicine

## 2022-08-10 ENCOUNTER — Ambulatory Visit: Payer: Self-pay | Attending: Family Medicine | Admitting: Pharmacist

## 2022-08-10 ENCOUNTER — Encounter: Payer: Self-pay | Admitting: Pharmacist

## 2022-08-10 ENCOUNTER — Telehealth: Payer: Self-pay | Admitting: Pharmacist

## 2022-08-10 DIAGNOSIS — Z79899 Other long term (current) drug therapy: Secondary | ICD-10-CM

## 2022-08-10 NOTE — Research (Signed)
    S:     No chief complaint on file.  57 y.o. male who presents for diabetes evaluation, education, and management in the context of the LIBERATE Study.   PMH is significant for [id number] complicated by neuropathy, retinopathy, and CKD. Also with a hx of HTN, CAD, TIA, PVD, HLD, anemia, DJD . Patient was referred and last seen by Primary Care Provider, Dr. Joya Gaskins, on 08/04/2022.   Today, patient arrives in good spirits and presents without any assistance.Patient reports Diabetes was diagnosed is longstanding.   Family/Social History:  Fhx: DM Tobacco: never smoker  Alcohol: none reported   Current diabetes medications include: Trulicity A999333 mg weekly   Patient reports adherence to taking all medications as prescribed.   Insurance coverage: none  Patient denies hypoglycemic events.  Patient denies nocturia (nighttime urination).  Patient denies neuropathy (nerve pain). Patient reports visual changes. Patient reports self foot exams.   Patient reported dietary habits: not covered during this visit  Patient-reported exercise habits: not covered during this visit   O:   ROS  Physical Exam   Lab Results  Component Value Date   HGBA1C 9.7 (A) 08/04/2022    There were no vitals filed for this visit.   Lipid Panel     Component Value Date/Time   CHOL 122 04/16/2020 0049   TRIG 102 01/20/2022 0353   HDL 20 (L) 04/16/2020 0049   CHOLHDL 6.1 04/16/2020 0049   VLDL 25 04/16/2020 0049   LDLCALC 77 04/16/2020 0049   Clinical Atherosclerotic Cardiovascular Disease (ASCVD): Yes  The ASCVD Risk score (Arnett DK, et al., 2019) failed to calculate for the following reasons:   The valid total cholesterol range is 130 to 320 mg/dL    A/P:  LIBERATE Study:  -Patient provided verbal consent to participate in the study. Consent documented in electronic medical record.  -Provided education on Libre 3 CGM. Collaborated to ensure Elenor Legato 3 app was downloaded on patient's phone.  Educated on how to place sensor every 14 days, patient placed first sensor correctly and verbalized understanding of use, removal, and how to place next sensor. Discussed alarms. 8 sensors provided for a 3 month supply. Educated to contact the office if the sensor falls off early and replacements are needed before their next [employer].    Diabetes longstanding currently uncontrolled. Patient is able to verbalize appropriate hypoglycemia management plan. Medication adherence appears appropriate. -Continued Trulicity A999333 mg weekly. Will see in 1 month to reassess.  -Patient educated on purpose, proper use, and potential adverse effects of Trulicity.  -Extensively discussed pathophysiology of diabetes, recommended lifestyle interventions, dietary effects on blood sugar control.  -Counseled on s/sx of and management of hypoglycemia.  -Next A1c anticipated 10/2022.   Written patient instructions provided. Patient verbalized understanding of treatment plan.  Total time in face to face counseling 30 minutes.    Follow-up:  Pharmacist in 1 month.  Benard Halsted, PharmD, [place], [place] [phone]

## 2022-08-10 NOTE — Telephone Encounter (Signed)
Requested Prescriptions  Pending Prescriptions Disp Refills   allopurinol (ZYLOPRIM) 100 MG tablet [Pharmacy Med Name: ALLOPURINOL 100 MG TABLET] 30 tablet 6    Sig: Gaffney LOS DIAS     Endocrinology:  Gout Agents - allopurinol Failed - 08/08/2022  1:05 AM      Failed - Cr in normal range and within 360 days    Creat  Date Value Ref Range Status  08/16/2020 1.18 0.70 - 1.33 mg/dL Final    Comment:    For patients >58 years of age, the reference limit for Creatinine is approximately 13% higher for people identified as African-American. .    Creatinine, Ser  Date Value Ref Range Status  08/04/2022 1.78 (H) 0.76 - 1.27 mg/dL Final   Creatinine, Urine  Date Value Ref Range Status  04/14/2020 89.55 mg/dL Final         Passed - Uric Acid in normal range and within 360 days    Uric Acid  Date Value Ref Range Status  02/05/2022 7.1 3.8 - 8.4 mg/dL Final    Comment:               Therapeutic target for gout patients: <6.0         Passed - Valid encounter within last 12 months    Recent Outpatient Visits           6 days ago Type 2 diabetes mellitus with stage 3b chronic kidney disease, without long-term current use of insulin ([place])   [place] Elsie Stain, MD   4 months ago Type 2 diabetes mellitus with stage 3b chronic kidney disease, without long-term current use of insulin ([place])   [place] Elsie Stain, MD   6 months ago CKD (chronic kidney disease) stage 4, GFR 15-29 ml/min [facility])   [place] Elsie Stain, MD       Future Appointments             In 1 month Elsie Stain, MD [place] within normal limits and completed in the last 12 months    WBC  Date Value Ref Range Status  02/05/2022 8.7 3.4 - 10.8 x10E3/uL Final  02/01/2022 11.6 (H) 4.0 - 10.5 K/uL  Final   RBC  Date Value Ref Range Status  02/05/2022 3.15 (L) 4.14 - 5.80 x10E6/uL Final  02/01/2022 2.61 (L) 4.22 - 5.81 MIL/uL Final   Hemoglobin  Date Value Ref Range Status  02/05/2022 9.8 (L) 13.0 - 17.7 g/dL Final   Hematocrit  Date Value Ref Range Status  02/05/2022 29.2 (L) 37.5 - 51.0 % Final   MCHC  Date Value Ref Range Status  02/05/2022 33.6 31.5 - 35.7 g/dL Final  02/01/2022 32.7 30.0 - 36.0 g/dL Final   [facility]  Date Value Ref Range Status  02/05/2022 31.1 26.6 - 33.0 pg Final  02/01/2022 31.0 26.0 - 34.0 pg Final   MCV  Date Value Ref Range Status  02/05/2022 93 79 - 97 fL Final   No results found for: "PLTCOUNTKUC", "LABPLAT", "POCPLA" RDW  Date Value Ref Range Status  02/05/2022 13.5 11.6 - 15.4 % Final          Rx did not transmit.

## 2022-08-10 NOTE — Telephone Encounter (Signed)
LIBERATE Study  Received referral for patient participation in the LIBERATE CGM Study. Contacted patient to discuss study and confirmed HIPAA identifiers. Confirmed patient was provided the LIBERATE Study Information Sheet and any questions were answered.   Confirmed that patient meets study criteria by having a diagnosis of Type 2 Diabetes, is not currently on insulin, and most recent A1c is >8%.  Patient provided verbal consent to participate in the study. Consent documented in electronic medical record.   @CGMFLO$ @  - Confirmed that patient has a compatible smart phone to [employer] 3 app. ([email or web address]) - Asked to download and create a Elenor Legato account prior to first study visit.  - Scheduled first study visit on 08/10/2022. Confirmed patient has transportation to this appointment.  - Discussed use of MyChart in this study. Confirmed patient has an active MyChart account and is aware of their log in information.  Patient aware of pre-visit questionnaire that will be sent 2 days prior to their scheduled study visit and they will plan to complete before the visit.    Benard Halsted, PharmD, [place], [employer] [phone]

## 2022-08-10 NOTE — Progress Notes (Signed)
    S:    No chief complaint on file.  57 y.o. male who presents for diabetes evaluation, education, and management in the context of the LIBERATE Study.   Patient was referred and last seen by Primary Care Provider, Dr. Joya Gaskins, on 08/04/2022 where A1c was elevated at [id number] and Trulicity A999333 mg weekly was started  Today, patient arrives in good spirits and presents without any assistance. He brings up questions regarding Jardiance which he obtained from a provider in [place]. Instructed patient to hold Jardiance until he is able to be seen for a therapy visit by the CPP in 1 month.   Current diabetes medications include: Trulicity A999333 mg weekly  Patient reports adherence to taking all medications as prescribed.   Insurance coverage: None  @CGMFLO$ @  O:   Lab Results  Component Value Date   HGBA1C 9.7 (A) 08/04/2022    POC A1c: 9.7% on 08/08/2021  There were no vitals filed for this visit.   Lipid Panel     Component Value Date/Time   CHOL 122 04/16/2020 0049   TRIG 102 01/20/2022 0353   HDL 20 (L) 04/16/2020 0049   CHOLHDL 6.1 04/16/2020 0049   VLDL 25 04/16/2020 0049   LDLCALC 77 04/16/2020 0049      A/P: LIBERATE Study:  -Patient provided verbal consent to participate in the study. Consent documented in electronic medical record.  -Provided education on Libre 3 CGM. Collaborated to ensure Elenor Legato 3 app was downloaded on patient's phone. Educated on how to place sensor every 14 days, patient placed first sensor correctly and verbalized understanding of use, removal, and how to place next sensor. Discussed alarms. 8 sensors provided for a 3 month supply. Educated to contact the office if the sensor falls off early and replacements are needed before their next [employer].    Diabetes longstanding currently uncontrolled based on A1c. Patient is able to verbalize appropriate hypoglycemia management plan. -Continued GLP-1 Trulicity A999333 mg weekly.  -Instructed  patient to hold Jardiance he obtained in [place] until he is seen by the CPP in 1 month.  -Patient educated on purpose, proper use, and potential adverse effects of Trulicity.  -Extensively discussed pathophysiology of diabetes, recommended lifestyle interventions, dietary effects on blood sugar control.  -Counseled on s/sx of and management of hypoglycemia.  -Next A1c anticipated 3 months.   Written patient instructions provided. Patient verbalized understanding of treatment plan.  Total time in face to face counseling 30 minutes.    Follow-up:  Pharmacist March 2024. PCP clinic visit in April 2024.   Joseph Art, Pharm.D. PGY-2 Ambulatory Care Pharmacy Resident 08/10/2022 3:54 PM

## 2022-08-17 ENCOUNTER — Telehealth: Payer: Self-pay | Admitting: Pharmacist

## 2022-08-17 NOTE — Telephone Encounter (Signed)
LIBERATE Study  Patient completed first study visit for the LIBERATE CGM Study. Contacted patient to discuss CGM tolerability. Unfortunately, I called x3 with no answer. Left HIPAA-compliant VM with instructions to return my call.   - Of note, we already have a therapy visit for DM management on 09/14/22 scheduled. Additionally, his second study visit is scheduled for 11/09/2022.    Keith Garrett, PharmD, Para [place], [place] [phone]

## 2022-08-25 ENCOUNTER — Telehealth: Payer: Self-pay | Admitting: Pharmacist

## 2022-08-25 NOTE — Telephone Encounter (Signed)
LIBERATE Study  Patient completed first study visit for the LIBERATE CGM Study. Contacted patient to discuss CGM tolerability. Confirmed HIPAA identifiers.   Patient confirms Keith Garrett 3 sensors is working and glucose values are transmitting appropriately. Denies any questions or concerns.   - Confirmed therapy visit on 09/14/2022. Confirmed patient has transportation to this appointment.  - Discussed use of MyChart in this study. Confirmed patient has an active MyChart account and is aware of their log in information.  Patient aware of pre-visit questionnaire that will be sent 2 days prior to their scheduled study visit and they will plan to complete before the visit.   Benard Halsted, PharmD, [place], [place] [phone]

## 2022-08-28 ENCOUNTER — Telehealth: Payer: Self-pay | Admitting: Emergency Medicine

## 2022-08-28 ENCOUNTER — Other Ambulatory Visit: Payer: Self-pay

## 2022-08-28 NOTE — Telephone Encounter (Signed)
Patient requesting refill of Trulicity. I have coordinated with our pharmacy and will get this ready for him.

## 2022-08-28 NOTE — Telephone Encounter (Signed)
Copied from [place] [id number]. Topic: General - Call Back - No Documentation >> Aug 28, 2022 10:57 AM Oley Balm A wrote: Reason for CRM: Pt is calling wanting to speak with Lurena Joiner to discuss his LIBERATE machine. Please call pt back.

## 2022-09-14 ENCOUNTER — Ambulatory Visit: Payer: Self-pay | Attending: Critical Care Medicine | Admitting: Pharmacist

## 2022-09-14 DIAGNOSIS — N1832 Chronic kidney disease, stage 3b: Secondary | ICD-10-CM

## 2022-09-14 DIAGNOSIS — E1122 Type 2 diabetes mellitus with diabetic chronic kidney disease: Secondary | ICD-10-CM

## 2022-09-14 NOTE — Progress Notes (Signed)
    S:    PCP: Dr. Rulon Abide  57 y.o. male who presents for diabetes evaluation, education, and management.  PMH is significant for HTN, TIA, PVD, T2DM, and HLD. Patient was referred and last seen by Primary Care Provider, Dr. Joya Gaskins, on 08/04/2022. Last seen by pharmacy clinic on 08/10/2022 for enrollment into the LIBERATE CGM study.    Today, patient arrives in good spirits and presents without any assistance. Reports tolerating Trulicity without side effects. He is very much enjoying his CGM. It has brought a lot of insight to his sugar control and he has made many dietary changes. See CGM data below. Using shared clinical decision making, he states he would like to stay on Trulicity [id number] mg given his TIR is 90% and estimated A1c is 6.8%.  Current diabetes medications include: Trulicity A999333 mg weekly (Tuesday)   Patient reports adherence to taking all medications as prescribed.  Insurance coverage: none  Patient denies hypoglycemic events.  Patient denies nocturia (nighttime urination).  Patient denies neuropathy (nerve pain). Patient denies visual changes, improved. Patient reports self foot exams.   Patient reported dietary habits:  -Cooking things more in the oven, eating less condiments.  -Avoiding rice and pasta and eating more veggies  -Eating more salads (does not eat salad dressing).    O:  CGM Download:  % Time CGM is active: 96% Average Glucose: 145 mg/dL Glucose Management Indicator: 6.8%  Glucose Variability: 17.8% (goal <36%) Time in Goal:  - Time in range 70-180: 91% - Time above range: 9% - Time below range: 0%   Lab Results  Component Value Date   HGBA1C 9.7 (A) 08/04/2022   There were no vitals filed for this visit.  Lipid Panel     Component Value Date/Time   CHOL 122 04/16/2020 0049   TRIG 102 01/20/2022 0353   HDL 20 (L) 04/16/2020 0049   CHOLHDL 6.1 04/16/2020 0049   VLDL 25 04/16/2020 0049   LDLCALC 77 04/16/2020 0049    Clinical  Atherosclerotic Cardiovascular Disease (ASCVD): Yes  The ASCVD Risk score (Arnett DK, et al., 2019) failed to calculate for the following reasons:   The valid total cholesterol range is 130 to 320 mg/dL    A/P: Diabetes longstanding, currently controlled based on CGM data. Patient is able to verbalize appropriate hypoglycemia management plan. Medication adherence appears appropriate. Using shared clinical decision making, he states he would like to stay on Trulicity A999333 mg given his TIR is 90% and estimated A1c is 6.8%. -Continued GLP-1 Trulicity (dulaglutide) 0.75 mg weekly using shared clinical decision making. I encouraged him to continue making dietary changes.  -Patient educated on purpose, proper use, and potential adverse effects of Trulicity.  -Extensively discussed pathophysiology of diabetes, recommended lifestyle interventions, dietary effects on blood sugar control.  -Counseled on s/sx of and management of hypoglycemia.  -Next A1c anticipated May 2024.   Written patient instructions provided. Patient verbalized understanding of treatment plan.  Total time in face to face counseling 25 minutes.    Follow-up:  Pharmacist 11/09/2022. PCP clinic visit in 10/07/2022.   Joseph Art, Pharm.D. PGY-2 Ambulatory Care Pharmacy Resident

## 2022-09-22 ENCOUNTER — Ambulatory Visit: Payer: Self-pay | Admitting: Critical Care Medicine

## 2022-09-28 ENCOUNTER — Other Ambulatory Visit: Payer: Self-pay | Admitting: Pharmacist

## 2022-09-28 NOTE — Telephone Encounter (Signed)
Medication Refill - Medication: Dulaglutide (TRULICITY) 0.75 MG/0.5ML SOPN   Has the patient contacted their pharmacy? Yes.   (Agent: If no, request that the patient contact the pharmacy for the refill. If patient does not wish to contact the pharmacy document the reason why and proceed with request.) (Agent: If yes, when and what did the pharmacy advise?)  Preferred Pharmacy (with phone number or street name):  [facility]  [address] [place] [address]  Phone: [phone] Fax: [phone]   Has the patient been seen for an appointment in the last year OR does the patient have an upcoming appointment? Yes.    Agent: Please be advised that RX refills may take up to 3 business days. We ask that you follow-up with your pharmacy.

## 2022-09-29 ENCOUNTER — Other Ambulatory Visit: Payer: Self-pay

## 2022-09-29 NOTE — Telephone Encounter (Signed)
Rx 08/04/22 53ml 4RF- too soon Requested Prescriptions  Pending Prescriptions Disp Refills   Dulaglutide (TRULICITY) 0.75 MG/0.5ML SOPN 2 mL 4    Sig: Inject 0.75 mg into the skin once a week.     Endocrinology:  Diabetes - GLP-1 Receptor Agonists Failed - 09/28/2022  3:17 PM      Failed - HBA1C is between 0 and 7.9 and within 180 days    HbA1c, POC (controlled diabetic range)  Date Value Ref Range Status  08/04/2022 9.7 (A) 0.0 - 7.0 % Final         Passed - Valid encounter within last 6 months    Recent Outpatient Visits           2 weeks ago Type 2 diabetes mellitus with stage 3b chronic kidney disease, without long-term current use of insulin (HCC)   [place] [facility] [place], [place] L, RPH-CPP   1 month ago Type 2 diabetes mellitus with stage 3b chronic kidney disease, without long-term current use of insulin (HCC)   [place] [facility] Storm Frisk, MD   6 months ago Type 2 diabetes mellitus with stage 3b chronic kidney disease, without long-term current use of insulin [facility])   [place] [facility] Storm Frisk, MD   7 months ago CKD (chronic kidney disease) stage 4, GFR 15-29 ml/min [facility])   [place] [facility] Storm Frisk, MD       Future Appointments             In 1 week Storm Frisk, MD [facility]

## 2022-10-07 ENCOUNTER — Ambulatory Visit: Payer: Self-pay | Attending: Critical Care Medicine | Admitting: Critical Care Medicine

## 2022-10-07 ENCOUNTER — Encounter: Payer: Self-pay | Admitting: Critical Care Medicine

## 2022-10-07 VITALS — BP 128/74 | HR 71 | Temp 97.7°F | Ht 59.0 in | Wt 188.0 lb

## 2022-10-07 DIAGNOSIS — I2584 Coronary atherosclerosis due to calcified coronary lesion: Secondary | ICD-10-CM

## 2022-10-07 DIAGNOSIS — E785 Hyperlipidemia, unspecified: Secondary | ICD-10-CM

## 2022-10-07 DIAGNOSIS — N1832 Chronic kidney disease, stage 3b: Secondary | ICD-10-CM

## 2022-10-07 DIAGNOSIS — E1122 Type 2 diabetes mellitus with diabetic chronic kidney disease: Secondary | ICD-10-CM

## 2022-10-07 DIAGNOSIS — G459 Transient cerebral ischemic attack, unspecified: Secondary | ICD-10-CM

## 2022-10-07 DIAGNOSIS — I2510 Atherosclerotic heart disease of native coronary artery without angina pectoris: Secondary | ICD-10-CM

## 2022-10-07 DIAGNOSIS — Z1211 Encounter for screening for malignant neoplasm of colon: Secondary | ICD-10-CM

## 2022-10-07 DIAGNOSIS — I10 Essential (primary) hypertension: Secondary | ICD-10-CM

## 2022-10-07 NOTE — Progress Notes (Signed)
Established patient Office Visit  Subjective    Patient ID: Keith Garrett, male    DOB: Jun 25, 1965  Age: 57 y.o. MRN: [id number]  CC:  Chief Complaint  Patient presents with   Hypertension    HTN & DM f/u. Med refills.  No questions / concerns.  No to shingles vax.     HPI 01/2022 Keith Garrett presents to establish care This patient is seen to establish care and is a 57 year old male complex medical history.  He has had type 2 diabetes for 26 years and hypertension for 10 years.  He immigrated from [place].  He has excellent English at this time.  Patient's had progression of his diabetes to the point where he had macular and retinal eye disease and is receiving injections and therapy by retinal specialist.  Patient progressed to the point where he was admitted to the hospital recently.  Below is copy of the discharge summary. Admit Date: 01/16/2022 Discharge date: 01/24/2022   Recommendations for Outpatient Follow-up:  Follow up with PCP in 1-2 weeks Please obtain CMP/CBC in one week Lisinopril/metformin/glipizide held-resume when renal function allows.   Admitted From:  Home   Disposition: Outpatient PT   Discharge Condition: good   CODE STATUS:   Code Status: Full Code    Diet recommendation:  Diet Order                  Diet - low sodium heart healthy             Diet heart healthy/carb modified Room service appropriate? Yes; Fluid consistency: Thin  Diet effective now                         Brief Summary: Patient is a 57 y.o.  male with history of HTN, DM-2-who developed vomiting after eating at a local [place] restaurant-he was subsequently feeling poorly-family found him unresponsive-EMS was called-he was brought to the hospital where he was found to have hypovolemic shock with AKI-severe encephalopathy-requiring intubation to protect airway.  He was subsequently admitted to the ICU-started on CRRT-stabilized and subsequently transferred to [facility]  service.  See below for further details.   Significant events: 7/28>> unresponsive-hypotensive-AKI-intubated-admitted to ICU. 8/02>> extubated 8/04>> transfer to [facility].   Significant studies: 7/28>> CT head: No acute intracranial abnormality 7/28>> CT C-spine: No fracture/subluxation 7/28>> renal ultrasound: No hydronephrosis   Significant microbiology data: 7/28>> COVID/influenza PCR: Negative 7/28>> blood culture: No growth   Procedures: 7/28-8/02>> ETT   Consults: PCCM, nephrology     Brief Hospital Course: Acute metabolic encephalopathy due to uremia/severe metabolic acidosis/hypovolemic shock: Resolved-awake and alert.   Acute hypoxic respiratory failure due to inability to protect airway in the setting of severe encephalopathy and aspiration PNA: Extubated on 8/2-titrated to room air.    Circulatory shock due to sepsis/hypovolemia (POA): Due to GI loss-aspiration PNA-shock physiology has resolved-BP stable.  Has completed a course of Unasyn and doxycycline.     Acute kidney injury: Likely ischemic ATN-required CRRT-renal function is gradually improving on its own-creatinine continues to downtrend with just supportive care-he is no longer on IVF.  Continue to avoid nephrotoxic agents.  Discussed with patient at length-he has a PCP-and I have recommended that he get a repeat electrolyte panel in 1 week.  Foley catheter was removed yesterday-and he is voiding on his own.  Severe metabolic acidosis: Due to AKI-resolved.   Hyponatremia/hypokalemia/hypocalcemia: Better-we will continue to replete K today.   Normocytic anemia: Due  to acute illness/AKI-no indications to transfuse at this point.  Follow CBC periodically.   Leukocytosis: Due to possible aspiration pneumonia-follow.   HTN: BP slowly creeping up-we will resume amlodipine and continue to hold lisinopril.     DM-2 (A1c 6.9): CBG stable on SSI-we will continue to hold glipizide/metformin-and transition to Tradjenta on  discharge.  Follow with PCP for further optimization.   Patient returned on August 12 for additional treatment for hyperkalemia.  He he is yet to achieve follow-up with nephrology  Patient also has peripheral artery disease but has not been of a by vascular surgery.  Patient also had prior history of infection in the left foot which is resolved but has significant onychomycosis.  No prior history of TIA but is not taking any aspirin therapy.  On arrival blood pressure was 154/89.  A1c at the emergency room last week was 6.9.  He is on Tradjenta orally alone.  He is not yet on insulin.  He does monitor his blood sugars at home and they stay in the 130-100 range.  He states he has no low numbers.  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  He has not had treatment for this or assessments of his uric acid.  10/10 Patient returns in follow-up and speaks good [place].  Patient's blood pressure 136/77 today he does not monitor blood pressure at home.  He remains uninsured.  Patient has yet to see vascular surgery.  He had a TIA in the past remains on aspirin.  Blood pressure is better.  He remains on amlodipine hydrochlorothiazide carvedilol.  Patient also on the Lipitor at 40 mg daily.  He needs follow-up of his blood pressure.  And lipids  Potassium was elevated at the last visit was a to be rechecked.  The patient also needs colon cancer screening  08/04/22 The patient is seen in return follow-up The patient's A1c is poorly controlled at 9.7 he states his blood glucose runs in the 130s but today its greater than 300.  He is only on oral Tradjenta and is not on insulin.  He is a candidate for the liberate trial.  Patient needs colon cancer screening follow-up as well.  He has no complaints.  10/07/22 Patient seen return follow-up patient speaks fluent [place].  Patient with type 2 diabetes now in the liberate trial.  He brings his CGM monitor with him.  Blood  sugars have come down but still range anywhere from 180-220.  He needs follow-up with pharmacy.  He is only on Trulicity weekly.   Outpatient Encounter Medications as of 10/07/2022  Medication Sig   allopurinol (ZYLOPRIM) 100 MG tablet TOME UNA TABLETA TODOS LOS DIAS   amLODipine (NORVASC) 10 MG tablet Take 1 tablet (10 mg total) by mouth daily.   aspirin EC 81 MG tablet Take 1 tablet (81 mg total) by mouth daily. Swallow whole.   atorvastatin (LIPITOR) 40 MG tablet Take 1 tablet (40 mg total) by mouth daily.   Blood Glucose Monitoring Suppl (TRUE METRIX METER) w/Device KIT Use to measure blood sugar twice a day   Blood Pressure Monitoring (BLOOD PRESSURE KIT) DEVI Use to measure blood pressure   carvedilol (COREG) 12.5 MG tablet Take 1 tablet (12.5 mg total) by mouth 2 (two) times daily with a meal.   Dulaglutide (TRULICITY) 0.75 MG/0.5ML SOPN Inject 0.75 mg into the skin once a week.   glucose blood (TRUE METRIX BLOOD GLUCOSE TEST) test strip Use  as instructed   hydrochlorothiazide (HYDRODIURIL) 25 MG tablet Take 1 tablet (25 mg total) by mouth daily.   sodium bicarbonate 650 MG tablet TOME UNA TABLETA DOS VECES AL DIA   TRUEplus Lancets 28G MISC Use to measure blood sugar twice a day   No facility-administered encounter medications on file as of 10/07/2022.    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   Traumatic rupture of plantar fascia of left foot 04/14/2020    Past Surgical History:  Procedure Laterality Date   ABDOMINAL AORTOGRAM W/LOWER EXTREMITY N/A 04/17/2020   Procedure: ABDOMINAL AORTOGRAM W/LOWER EXTREMITY;  Surgeon: Cephus Shelling, MD;  Location: [facility]  INVASIVE CV LAB;  Service: Cardiovascular;  Laterality: N/A;   TEE WITHOUT CARDIOVERSION N/A 04/16/2020   Procedure: TRANSESOPHAGEAL ECHOCARDIOGRAM (TEE);  Surgeon: Little Ishikawa, MD;  Location: [facility] ENDOSCOPY;  Service: Cardiovascular;  Laterality: N/A;    Family History  Problem Relation Age of Onset   Diabetes Mother     Social History   Socioeconomic History   Marital status: Married    Spouse name: Not on file   Number of children: Not on file   Years of education: Not on file   Highest education level: Not on file  Occupational History   Not on file  Tobacco Use   Smoking status: Never   Smokeless tobacco: Never  Vaping Use   Vaping Use: Never used  Substance and Sexual Activity   Alcohol use: Not Currently    Comment: occasionally   Drug use: No   Sexual activity: Not Currently  Other Topics Concern   Not on file  Social History Narrative   Not on file   Social Determinants of Health   Financial Resource Strain: Low Risk  (09/14/2022)   Overall Financial Resource Strain (CARDIA)    Difficulty of Paying Living Expenses: Not very hard  Food Insecurity: No Food Insecurity (09/14/2022)   Hunger Vital Sign    Worried About Running Out of Food in the Last Year: Never true    Ran Out of Food in the Last Year: Never true  Transportation Needs: No Transportation Needs (09/14/2022)   PRAPARE - Administrator, Civil Service (Medical): No    Lack of Transportation (Non-Medical): No  Physical Activity: Inactive (09/14/2022)   Exercise Vital Sign    Days of Exercise per Week: 0 days    Minutes of Exercise per Session: 0 min  Stress: No Stress Concern Present (09/14/2022)   [employer] of Occupational Health - Occupational Stress Questionnaire    Feeling of Stress : Only a little  Social Connections: Socially Integrated (09/14/2022)   Social Connection and Isolation Panel [NHANES]    Frequency of Communication with Friends and Family: More than  three times a week    Frequency of Social Gatherings with Friends and Family: More than three times a week    Attends Religious Services: More than 4 times per year    Active Member of [employer] or Organizations: Yes    Attends Engineer, structural: More than 4 times per year    Marital Status: Married  Catering manager Violence:  Not At Risk (09/14/2022)   Humiliation, Afraid, Rape, and Kick questionnaire    Fear of Current or Ex-Partner: No    Emotionally Abused: No    Physically Abused: No    Sexually Abused: No    Review of Systems  Constitutional:  Negative for chills, diaphoresis, fever, malaise/fatigue and weight loss.  HENT:  Negative for congestion, hearing loss, nosebleeds, sore throat and tinnitus.   Eyes:  Negative for blurred vision, photophobia and redness.  Respiratory:  Negative for cough, hemoptysis, sputum production, shortness of breath, wheezing and stridor.   Cardiovascular:  Negative for chest pain, palpitations, orthopnea, claudication, leg swelling and PND.  Gastrointestinal:  Negative for abdominal pain, blood in stool, constipation, diarrhea, heartburn, nausea and vomiting.  Genitourinary:  Negative for dysuria, flank pain, frequency, hematuria and urgency.  Musculoskeletal:  Negative for back pain, falls, joint pain, myalgias and neck pain.  Skin:  Negative for itching and rash.  Neurological:  Negative for dizziness, tingling, tremors, sensory change, speech change, focal weakness, seizures, loss of consciousness, weakness and headaches.  Endo/Heme/Allergies:  Negative for environmental allergies and polydipsia. Does not bruise/bleed easily.  Psychiatric/Behavioral:  Negative for depression, memory loss, substance abuse and suicidal ideas. The patient is not nervous/anxious and does not have insomnia.         Objective    BP 128/74   Pulse 71   Temp 97.7 F (36.5 C) (Oral)   Ht 4\' 11"  (1.499 m)   Wt 188 lb (85.3 kg)   SpO2 99%   BMI 37.97 kg/m    Physical Exam Vitals reviewed.  Constitutional:      Appearance: Normal appearance. He is well-developed. He is obese. He is not diaphoretic.  HENT:     Head: Normocephalic and atraumatic.     Nose: No nasal deformity, septal deviation, mucosal edema or rhinorrhea.     Right Sinus: No maxillary sinus tenderness or frontal sinus tenderness.     Left Sinus: No maxillary sinus tenderness or frontal sinus tenderness.     Mouth/Throat:     Pharynx: No oropharyngeal exudate.     Comments: Poor dentition Eyes:     General: No scleral icterus.    Conjunctiva/sclera: Conjunctivae normal.     Pupils: Pupils are equal, round, and reactive to light.  Neck:     Thyroid: No thyromegaly.     Vascular: No carotid bruit or JVD.     Trachea: Trachea normal. No tracheal tenderness or tracheal deviation.  Cardiovascular:     Rate and Rhythm: Normal rate and regular rhythm.     Chest Wall: PMI is not displaced.     Pulses: No decreased pulses.     Heart sounds: Normal heart sounds, S1 normal and S2 normal. Heart sounds not distant. No murmur heard.    No systolic murmur is present.     No diastolic murmur is present.     No friction rub. No gallop. No S3 or S4 sounds.     Comments: Decrease DP PT pulses bilaterally in both feet Pulmonary:     Effort: No tachypnea, accessory muscle usage or respiratory distress.     Breath sounds: No stridor. No decreased breath sounds, wheezing, rhonchi or rales.  Chest:     Chest wall: No tenderness.  Abdominal:     General: Bowel sounds are normal. There is no distension.     Palpations: Abdomen is soft. Abdomen is not rigid.     Tenderness: There is no abdominal tenderness.  There is no guarding or rebound.  Musculoskeletal:        General: Normal range of motion.     Cervical back: Normal range of motion and neck supple. No edema, erythema or rigidity. No muscular tenderness. Normal range of motion.  Lymphadenopathy:     Head:     Right side of head: No  submental or submandibular adenopathy.     Left side of head: No submental or submandibular adenopathy.     Cervical: No cervical adenopathy.  Skin:    General: Skin is warm and dry.     Coloration: Skin is not pale.     Findings: No rash.     Nails: There is no clubbing.  Neurological:     Mental Status: He is alert and oriented to person, place, and time.     Sensory: No sensory deficit.  Psychiatric:        Speech: Speech normal.        Behavior: Behavior normal.         Assessment & Plan:   Problem List Items Addressed This Visit       Cardiovascular and Mediastinum   Hypertension - Primary   Relevant Orders   CBC with Differential/Platelet   Calcification of coronary artery   Relevant Orders   Lipid panel     Endocrine   Type II diabetes mellitus with stage 3 chronic kidney disease   Relevant Orders   Basic metabolic panel     Other   Dyslipidemia   Relevant Orders   Lipid panel   Other Visit Diagnoses     Colon cancer screening       Relevant Orders   Fecal occult blood, imunochemical     30 min spent assessing patient reviewing records complex decision making high Return in about 2 months (around 12/07/2022) for htn, diabetes.   Shan Levans, MD

## 2022-10-07 NOTE — Assessment & Plan Note (Addendum)
Continue atorvastatin and aspirin ?

## 2022-10-07 NOTE — Assessment & Plan Note (Signed)
Reassess renal function refer back to pharmacy for further management of diabetes continue with CGM monitor continue with Trulicity

## 2022-10-07 NOTE — Assessment & Plan Note (Signed)
Continue with lipid therapy 

## 2022-10-07 NOTE — Assessment & Plan Note (Addendum)
Blood pressure at goal continue amlodipine and carvedilol along with hydrochlorothiazide

## 2022-10-07 NOTE — Assessment & Plan Note (Signed)
Continue aspirin 

## 2022-10-07 NOTE — Patient Instructions (Signed)
Go to lab for blood work Pick up colon cancer screen kit No change in medication Return Dr Delford Field 2 months

## 2022-10-08 LAB — CBC WITH DIFFERENTIAL/PLATELET
Basophils Absolute: 0 10*3/uL (ref 0.0–0.2)
Basos: 1 %
EOS (ABSOLUTE): 0.5 10*3/uL — ABNORMAL HIGH (ref 0.0–0.4)
Eos: 5 %
Hematocrit: 32.8 % — ABNORMAL LOW (ref 37.5–51.0)
Hemoglobin: 10.8 g/dL — ABNORMAL LOW (ref 13.0–17.7)
Immature Grans (Abs): 0 10*3/uL (ref 0.0–0.1)
Immature Granulocytes: 0 %
Lymphocytes Absolute: 1.2 10*3/uL (ref 0.7–3.1)
Lymphs: 14 %
MCH: 29.8 pg (ref 26.6–33.0)
MCHC: 32.9 g/dL (ref 31.5–35.7)
MCV: 91 fL (ref 79–97)
Monocytes Absolute: 0.6 10*3/uL (ref 0.1–0.9)
Monocytes: 7 %
Neutrophils Absolute: 6.5 10*3/uL (ref 1.4–7.0)
Neutrophils: 73 %
Platelets: 291 10*3/uL (ref 150–450)
RBC: 3.62 x10E6/uL — ABNORMAL LOW (ref 4.14–5.80)
RDW: 14.8 % (ref 11.6–15.4)
WBC: 8.8 10*3/uL (ref 3.4–10.8)

## 2022-10-08 LAB — LIPID PANEL
Chol/HDL Ratio: 2.9 ratio (ref 0.0–5.0)
Cholesterol, Total: 104 mg/dL (ref 100–199)
HDL: 36 mg/dL — ABNORMAL LOW (ref >39)
LDL Chol Calc (NIH): 47 mg/dL (ref 0–99)
Triglycerides: 115 mg/dL (ref 0–149)
VLDL Cholesterol Cal: 21 mg/dL (ref 5–40)

## 2022-10-08 NOTE — Progress Notes (Signed)
Let the patient know his cholesterol is at goal and blood counts are stable

## 2022-10-13 ENCOUNTER — Telehealth: Payer: Self-pay

## 2022-10-13 NOTE — Telephone Encounter (Signed)
Pt was called and vm was left, Information has been sent to nurse pool.   Interpreter id #[id number]

## 2022-10-13 NOTE — Telephone Encounter (Signed)
-----   Message from Storm Frisk, MD sent at 10/08/2022  6:04 AM EDT ----- Let the patient know his cholesterol is at goal and blood counts are stable

## 2022-10-26 ENCOUNTER — Other Ambulatory Visit: Payer: Self-pay

## 2022-11-09 ENCOUNTER — Other Ambulatory Visit: Payer: Self-pay

## 2022-11-09 ENCOUNTER — Ambulatory Visit: Payer: Self-pay | Attending: Critical Care Medicine | Admitting: Pharmacist

## 2022-11-09 DIAGNOSIS — N1832 Chronic kidney disease, stage 3b: Secondary | ICD-10-CM

## 2022-11-09 DIAGNOSIS — E1122 Type 2 diabetes mellitus with diabetic chronic kidney disease: Secondary | ICD-10-CM

## 2022-11-09 DIAGNOSIS — Z7985 Long-term (current) use of injectable non-insulin antidiabetic drugs: Secondary | ICD-10-CM

## 2022-11-09 LAB — POCT GLYCOSYLATED HEMOGLOBIN (HGB A1C): HbA1c, POC (controlled diabetic range): 7.1 % — AB (ref 0.0–7.0)

## 2022-11-09 MED ORDER — TRULICITY 1.5 MG/0.5ML ~~LOC~~ SOAJ
1.5000 mg | SUBCUTANEOUS | 1 refills | Status: DC
  Filled 2022-11-09 – 2022-11-23 (×2): qty 2, 28d supply, fill #0
  Filled 2022-12-21: qty 2, 28d supply, fill #1
  Filled 2023-01-18: qty 2, 28d supply, fill #2
  Filled 2023-02-15: qty 2, 28d supply, fill #3

## 2022-11-09 NOTE — Progress Notes (Signed)
S:    PCP: Dr. Delford Field  57 y.o. male who presents for diabetes evaluation, education, and management in the context of the LIBERATE Study.  PMH is significant for HTN, TIA, PVD, T2DM, and HLD. Patient was referred by Primary Care Provider, Dr. Delford Field, on 08/04/2022. He was enrolled in the LIBERATE study on 08/10/2022. Last seen by PCP on 10/07/2022.  At last visit, BG on CGM was 180-220. He was instructed to follow up with pharmacy.   Today, patient arrives in good spirits and presents without any assistance. Reports feeling much better since starting CGM. He is no longer having blurry vision. A1c today in clinic 7.1%, down from 9.7%. He has made drastic dietary changes since staring his [employer]. Denies GI upset with Trulicity.   Patient reports diabetes is long standing.   Current diabetes medications include: Trulicity 0.75 mg weekly.   Patient reports adherence to taking all medications as prescribed.   Insurance coverage: none  Patient denies hypoglycemic events.  CGM Study Study visit: 3 Month Follow Up Visit  CGM Data Download date: 11/09/2022 % Time CGM Is Active: 98 % Average glucose (mg/dL): 696 mg/dL Glucose Management Indicator (%): 7.1 % Glucose Variability (%): 19.6 % Time Above Range >180 mg/dL (%): 23 % Time in Range 70-180 mg/dL (%): 77 % Time Below Range <70 mg/dL (%): 0 %  Diabetes Distress Scale Feeling like diabetes is taking up too much of my mental and physical energy every day.: Not a problem Feeling that my doctor doesn't know enough about diabetes and diabetic care. : Not a problem Feeling angry, scared, and/or depressed when I think about living with diabetes : Not a problem Feeling that my doctor doesn't give my clear directions on how to manage my diabetes. : Not a problem Feeling that im not testing my blood sugars frequently enough.: Not a problem Feeling that I'm often failing with my diabetes routine: Not a problem Feelling that friends  and family are not supportive enough of self care efforts.: Not a problem Feeling that diabetes controls my life.: Not a problem Feeling that my doctor doesn't take my concerns seriously enough: Not a problem Not feeling confident in my day to day ability to manage diabetes: Not a problem Feeling that I will end up with serious long term complications no matter what I do.: Not a problem Feeling that I am not sticking closely enough to a good meal plan.: Not a problem Feeling that friends or family don't appreciate how difficult living with diabetes can be. : Not a problem Feeling overwhelmed by the demands of living with diabetes.: Not a problem Feeling that I don't have a doctor who I can see.: Not a problem Not feeling motivated to keep up my diabetes self management.: Not a problem Feeling that friends or family don't give me the emotional support that I would like. : Not a problem DDS17 Score: 17 Emotional Burden Score: 1 Physician related distress score: 1 Regimen Related Distress score : 1 Interpersonal distress score: 1    Patient denies nocturia (nighttime urination).  Patient denies neuropathy (nerve pain). Patient denies visual changes. Patient reports self foot exams.   Patient reported dietary habits:  -Cut out fast food, greasy good, and soda  -Cut down on portion size   O:  ROS  Physical Exam   Lab Results  Component Value Date   HGBA1C 7.1 (A) 11/09/2022    POC A1c Today: 7.1%  There were no vitals filed  for this visit.   Lipid Panel     Component Value Date/Time   CHOL 104 10/07/2022 0921   TRIG 115 10/07/2022 0921   HDL 36 (L) 10/07/2022 0921   CHOLHDL 2.9 10/07/2022 0921   CHOLHDL 6.1 04/16/2020 0049   VLDL 25 04/16/2020 0049   LDLCALC 47 10/07/2022 0921    Clinical Atherosclerotic Cardiovascular Disease (ASCVD): No  The ASCVD Risk score (Arnett DK, et al., 2019) failed to calculate for the following reasons:   The valid total cholesterol  range is 130 to 320 mg/dL     A/P:  LIBERATE Study:  - 8 sensors provided for a 3 month supply. Educated to contact the office if the sensor falls off early and replacements are needed before their next [employer].   Diabetes longstanding currently close to goal based on A1c today (7.1%, down from 9.7%). Patient is able to verbalize appropriate hypoglycemia management plan. Medication adherence appears appropriate. He has made drastic dietary changes since having insight from his CGM.  -Increased dose of GLP-1 Trulicity (dulaglutide) to 1.5 mg weekly.  -Patient educated on purpose, proper use, and potential adverse effects of Trulicity.  -Extensively discussed pathophysiology of diabetes, recommended lifestyle interventions, dietary effects on blood sugar control.  -Counseled on s/sx of and management of hypoglycemia.  -Next A1c anticipated August 2024.   Written patient instructions provided. Patient verbalized understanding of treatment plan.  Total time in face to face counseling 30 minutes.    Follow-up:  Pharmacist 3 months. PCP clinic visit in 1 month.   Valeda Malm, Pharm.D. PGY-2 Ambulatory Care Pharmacy Resident

## 2022-11-17 ENCOUNTER — Other Ambulatory Visit: Payer: Self-pay

## 2022-11-23 ENCOUNTER — Other Ambulatory Visit: Payer: Self-pay

## 2022-12-09 ENCOUNTER — Ambulatory Visit: Payer: Self-pay | Attending: Critical Care Medicine | Admitting: Critical Care Medicine

## 2022-12-09 ENCOUNTER — Encounter: Payer: Self-pay | Admitting: Critical Care Medicine

## 2022-12-09 ENCOUNTER — Other Ambulatory Visit: Payer: Self-pay

## 2022-12-09 VITALS — BP 132/79 | HR 86 | Wt 190.2 lb

## 2022-12-09 DIAGNOSIS — N1832 Chronic kidney disease, stage 3b: Secondary | ICD-10-CM

## 2022-12-09 DIAGNOSIS — Z1211 Encounter for screening for malignant neoplasm of colon: Secondary | ICD-10-CM

## 2022-12-09 DIAGNOSIS — G459 Transient cerebral ischemic attack, unspecified: Secondary | ICD-10-CM

## 2022-12-09 DIAGNOSIS — E1122 Type 2 diabetes mellitus with diabetic chronic kidney disease: Secondary | ICD-10-CM

## 2022-12-09 DIAGNOSIS — I739 Peripheral vascular disease, unspecified: Secondary | ICD-10-CM

## 2022-12-09 DIAGNOSIS — Z7985 Long-term (current) use of injectable non-insulin antidiabetic drugs: Secondary | ICD-10-CM

## 2022-12-09 DIAGNOSIS — I10 Essential (primary) hypertension: Secondary | ICD-10-CM

## 2022-12-09 MED ORDER — ATORVASTATIN CALCIUM 40 MG PO TABS
40.0000 mg | ORAL_TABLET | Freq: Every day | ORAL | 3 refills | Status: DC
  Filled 2022-12-09: qty 90, 90d supply, fill #0

## 2022-12-09 MED ORDER — SODIUM BICARBONATE 650 MG PO TABS
650.0000 mg | ORAL_TABLET | Freq: Two times a day (BID) | ORAL | 1 refills | Status: DC
  Filled 2022-12-09: qty 180, 90d supply, fill #0
  Filled 2023-03-11: qty 180, 90d supply, fill #1

## 2022-12-09 MED ORDER — HYDROCHLOROTHIAZIDE 25 MG PO TABS
25.0000 mg | ORAL_TABLET | Freq: Every day | ORAL | 2 refills | Status: DC
  Filled 2022-12-09: qty 90, 90d supply, fill #0
  Filled 2023-03-11: qty 90, 90d supply, fill #1
  Filled 2023-06-14: qty 90, 90d supply, fill #2

## 2022-12-09 MED ORDER — CARVEDILOL 12.5 MG PO TABS
12.5000 mg | ORAL_TABLET | Freq: Two times a day (BID) | ORAL | 3 refills | Status: DC
  Filled 2022-12-09: qty 180, 90d supply, fill #0
  Filled 2023-03-11: qty 180, 90d supply, fill #1
  Filled 2023-06-14: qty 120, 60d supply, fill #2

## 2022-12-09 MED ORDER — AMLODIPINE BESYLATE 10 MG PO TABS
10.0000 mg | ORAL_TABLET | Freq: Every day | ORAL | 2 refills | Status: DC
  Filled 2022-12-09: qty 90, 90d supply, fill #0
  Filled 2023-03-11: qty 90, 90d supply, fill #1
  Filled 2023-06-14: qty 90, 90d supply, fill #2

## 2022-12-09 NOTE — Progress Notes (Signed)
Established patient Office Visit  Subjective    Patient ID: Keith Garrett, male    DOB: Feb 01, 1966  Age: 57 y.o. MRN: [id number]  CC:  Chief Complaint  Patient presents with   Medication Refill    HPI 01/2022 Keith Garrett presents to establish care This patient is seen to establish care and is a 57 year old male complex medical history.  He has had type 2 diabetes for 26 years and hypertension for 10 years.  He immigrated from [place].  He has excellent English at this time.  Patient's had progression of his diabetes to the point where he had macular and retinal eye disease and is receiving injections and therapy by retinal specialist.  Patient progressed to the point where he was admitted to the hospital recently.  Below is copy of the discharge summary. Admit Date: 01/16/2022 Discharge date: 01/24/2022   Recommendations for Outpatient Follow-up:  Follow up with PCP in 1-2 weeks Please obtain CMP/CBC in one week Lisinopril/metformin/glipizide held-resume when renal function allows.   Admitted From:  Home   Disposition: Outpatient PT   Discharge Condition: good   CODE STATUS:   Code Status: Full Code    Diet recommendation:  Diet Order                  Diet - low sodium heart healthy             Diet heart healthy/carb modified Room service appropriate? Yes; Fluid consistency: Thin  Diet effective now                         Brief Summary: Patient is a 57 y.o.  male with history of HTN, DM-2-who developed vomiting after eating at a local [place] restaurant-he was subsequently feeling poorly-family found him unresponsive-EMS was called-he was brought to the hospital where he was found to have hypovolemic shock with AKI-severe encephalopathy-requiring intubation to protect airway.  He was subsequently admitted to the ICU-started on CRRT-stabilized and subsequently transferred to [facility] service.  See below for further details.   Significant events: 7/28>>  unresponsive-hypotensive-AKI-intubated-admitted to ICU. 8/02>> extubated 8/04>> transfer to [facility].   Significant studies: 7/28>> CT head: No acute intracranial abnormality 7/28>> CT C-spine: No fracture/subluxation 7/28>> renal ultrasound: No hydronephrosis   Significant microbiology data: 7/28>> COVID/influenza PCR: Negative 7/28>> blood culture: No growth   Procedures: 7/28-8/02>> ETT   Consults: PCCM, nephrology     Brief Hospital Course: Acute metabolic encephalopathy due to uremia/severe metabolic acidosis/hypovolemic shock: Resolved-awake and alert.   Acute hypoxic respiratory failure due to inability to protect airway in the setting of severe encephalopathy and aspiration PNA: Extubated on 8/2-titrated to room air.    Circulatory shock due to sepsis/hypovolemia (POA): Due to GI loss-aspiration PNA-shock physiology has resolved-BP stable.  Has completed a course of Unasyn and doxycycline.     Acute kidney injury: Likely ischemic ATN-required CRRT-renal function is gradually improving on its own-creatinine continues to downtrend with just supportive care-he is no longer on IVF.  Continue to avoid nephrotoxic agents.  Discussed with patient at length-he has a PCP-and I have recommended that he get a repeat electrolyte panel in 1 week.  Foley catheter was removed yesterday-and he is voiding on his own.  Severe metabolic acidosis: Due to AKI-resolved.   Hyponatremia/hypokalemia/hypocalcemia: Better-we will continue to replete K today.   Normocytic anemia: Due to acute illness/AKI-no indications to transfuse at this point.  Follow CBC periodically.   Leukocytosis: Due to possible  aspiration pneumonia-follow.   HTN: BP slowly creeping up-we will resume amlodipine and continue to hold lisinopril.     DM-2 (A1c 6.9): CBG stable on SSI-we will continue to hold glipizide/metformin-and transition to Tradjenta on discharge.  Follow with PCP for further optimization.   Patient  returned on August 12 for additional treatment for hyperkalemia.  He he is yet to achieve follow-up with nephrology  Patient also has peripheral artery disease but has not been of a by vascular surgery.  Patient also had prior history of infection in the left foot which is resolved but has significant onychomycosis.  No prior history of TIA but is not taking any aspirin therapy.  On arrival blood pressure was 154/89.  A1c at the emergency room last week was 6.9.  He is on Tradjenta orally alone.  He is not yet on insulin.  He does monitor his blood sugars at home and they stay in the 130-100 range.  He states he has no low numbers.  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  He has not had treatment for this or assessments of his uric acid.  10/10 Patient returns in follow-up and speaks good [place].  Patient's blood pressure 136/77 today he does not monitor blood pressure at home.  He remains uninsured.  Patient has yet to see vascular surgery.  He had a TIA in the past remains on aspirin.  Blood pressure is better.  He remains on amlodipine hydrochlorothiazide carvedilol.  Patient also on the Lipitor at 40 mg daily.  He needs follow-up of his blood pressure.  And lipids  Potassium was elevated at the last visit was a to be rechecked.  The patient also needs colon cancer screening  08/04/22 The patient is seen in return follow-up The patient's A1c is poorly controlled at 9.7 he states his blood glucose runs in the 130s but today its greater than 300.  He is only on oral Tradjenta and is not on insulin.  He is a candidate for the liberate trial.  Patient needs colon cancer screening follow-up as well.  He has no complaints.  10/07/22 Patient seen return follow-up patient speaks fluent [place].  Patient with type 2 diabetes now in the liberate trial.  He brings his CGM monitor with him.  Blood sugars have come down but still range anywhere from 180-220.  He  needs follow-up with pharmacy.  He is only on Trulicity weekly.  12/09/22 Patient seen in return follow-up patient has good [place].  He has type 2 diabetes is in the liberate trial and is getting a continuous glucose monitor.  His A1c is down to 7% from a previous value greater than 8.  Blood pressure on arrival is 132/79.  He needs follow-up metabolic panel.  Also needs a fecal occult screening kit for colon cancer.  He has no other complaints.  He has had significant lifestyle management changes which have improved his diabetic control.  He is on Trulicity alone at increased dose.  Outpatient Encounter Medications as of 12/09/2022  Medication Sig   allopurinol (ZYLOPRIM) 100 MG tablet TOME UNA TABLETA TODOS LOS DIAS   aspirin EC 81 MG tablet Take 1 tablet (81 mg total) by mouth daily. Swallow whole.   Blood Glucose Monitoring Suppl (TRUE METRIX METER) w/Device KIT Use to measure blood sugar twice a day   Blood Pressure Monitoring (BLOOD PRESSURE KIT) DEVI Use to measure blood pressure   Dulaglutide (TRULICITY) 1.5 MG/0.5ML  SOPN Inject 1.5 mg into the skin once a week.   glucose blood (TRUE METRIX BLOOD GLUCOSE TEST) test strip Use as instructed   TRUEplus Lancets 28G MISC Use to measure blood sugar twice a day   [DISCONTINUED] amLODipine (NORVASC) 10 MG tablet Take 1 tablet (10 mg total) by mouth daily.   [DISCONTINUED] atorvastatin (LIPITOR) 40 MG tablet Take 1 tablet (40 mg total) by mouth daily.   [DISCONTINUED] carvedilol (COREG) 12.5 MG tablet Take 1 tablet (12.5 mg total) by mouth 2 (two) times daily with a meal.   [DISCONTINUED] hydrochlorothiazide (HYDRODIURIL) 25 MG tablet Take 1 tablet (25 mg total) by mouth daily.   [DISCONTINUED] sodium bicarbonate 650 MG tablet TOME UNA TABLETA DOS VECES AL DIA   amLODipine (NORVASC) 10 MG tablet Take 1 tablet (10 mg total) by mouth daily.   atorvastatin (LIPITOR) 40 MG tablet Take 1 tablet (40 mg total) by mouth daily.   carvedilol (COREG) 12.5 MG  tablet Take 1 tablet (12.5 mg total) by mouth 2 (two) times daily with a meal.   hydrochlorothiazide (HYDRODIURIL) 25 MG tablet Take 1 tablet (25 mg total) by mouth daily.   sodium bicarbonate 650 MG tablet Take 1 tablet (650 mg total) by mouth 2 (two) times daily.   No facility-administered encounter medications on file as of 12/09/2022.    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   Traumatic rupture of plantar fascia of left foot 04/14/2020    Past Surgical History:  Procedure Laterality Date   ABDOMINAL AORTOGRAM W/LOWER EXTREMITY N/A 04/17/2020   Procedure: ABDOMINAL AORTOGRAM W/LOWER EXTREMITY;  Surgeon: Cephus Shelling, MD;  Location: [facility] INVASIVE CV LAB;  Service: Cardiovascular;  Laterality: N/A;   TEE WITHOUT CARDIOVERSION N/A 04/16/2020   Procedure: TRANSESOPHAGEAL ECHOCARDIOGRAM (TEE);  Surgeon: Little Ishikawa, MD;  Location: [facility] ENDOSCOPY;  Service: Cardiovascular;  Laterality: N/A;    Family History  Problem Relation Age of Onset   Diabetes Mother     Social History   Socioeconomic History   Marital status: Married    Spouse name: Not on file   Number of children: Not on file   Years of education: Not on file   Highest education level: Not on file  Occupational History   Not on file  Tobacco Use   Smoking status: Never   Smokeless tobacco: Never  Vaping Use   Vaping Use: Never used  Substance and Sexual Activity   Alcohol use: Not Currently    Comment: occasionally   Drug use: No   Sexual activity: Not Currently  Other Topics Concern   Not on file  Social History Narrative   Not on file   Social  Determinants of Health   Financial Resource Strain: Low Risk  (09/14/2022)   Overall Financial Resource Strain (CARDIA)    Difficulty of Paying Living Expenses: Not very hard  Food Insecurity: No Food Insecurity (09/14/2022)   Hunger Vital Sign    Worried About Running Out of Food in the Last Year: Never true    Ran Out of Food in the Last Year: Never true  Transportation Needs: No Transportation  Needs (09/14/2022)   PRAPARE - Administrator, Civil Service (Medical): No    Lack of Transportation (Non-Medical): No  Physical Activity: Inactive (09/14/2022)   Exercise Vital Sign    Days of Exercise per Week: 0 days    Minutes of Exercise per Session: 0 min  Stress: No Stress Concern Present (09/14/2022)   [employer] of Occupational Health - Occupational Stress Questionnaire    Feeling of Stress : Only a little  Social Connections: Socially Integrated (09/14/2022)   Social Connection and Isolation Panel [NHANES]    Frequency of Communication with Friends and Family: More than three times a week    Frequency of Social Gatherings with Friends and Family: More than three times a week    Attends Religious Services: More than 4 times per year    Active Member of [employer] or Organizations: Yes    Attends Engineer, structural: More than 4 times per year    Marital Status: Married  Catering manager Violence: Not At Risk (09/14/2022)   Humiliation, Afraid, Rape, and Kick questionnaire    Fear of Current or Ex-Partner: No    Emotionally Abused: No    Physically Abused: No    Sexually Abused: No    Review of Systems  Constitutional:  Negative for chills, diaphoresis, fever, malaise/fatigue and weight loss.  HENT:  Negative for congestion, hearing loss, nosebleeds, sore throat and tinnitus.   Eyes:  Negative for blurred vision, photophobia and redness.  Respiratory:  Negative for cough, hemoptysis, sputum production, shortness of breath, wheezing and stridor.    Cardiovascular:  Negative for chest pain, palpitations, orthopnea, claudication, leg swelling and PND.  Gastrointestinal:  Negative for abdominal pain, blood in stool, constipation, diarrhea, heartburn, nausea and vomiting.  Genitourinary:  Negative for dysuria, flank pain, frequency, hematuria and urgency.  Musculoskeletal:  Negative for back pain, falls, joint pain, myalgias and neck pain.  Skin:  Negative for itching and rash.  Neurological:  Negative for dizziness, tingling, tremors, sensory change, speech change, focal weakness, seizures, loss of consciousness, weakness and headaches.  Endo/Heme/Allergies:  Negative for environmental allergies and polydipsia. Does not bruise/bleed easily.  Psychiatric/Behavioral:  Negative for depression, memory loss, substance abuse and suicidal ideas. The patient is not nervous/anxious and does not have insomnia.         Objective    BP 132/79 (BP Location: Right Arm, Patient Position: Sitting, Cuff Size: Normal)   Pulse 86   Wt 190 lb 3.2 oz (86.3 kg)   SpO2 97%   BMI 38.42 kg/m   Physical Exam Vitals reviewed.  Constitutional:      Appearance: Normal appearance. He is well-developed. He is obese. He is not diaphoretic.  HENT:     Head: Normocephalic and atraumatic.     Nose: No nasal deformity, septal deviation, mucosal edema or rhinorrhea.     Right Sinus: No maxillary sinus tenderness or frontal sinus tenderness.     Left Sinus: No maxillary sinus tenderness or frontal sinus tenderness.     Mouth/Throat:     Pharynx: No oropharyngeal exudate.     Comments: Poor dentition Eyes:     General: No scleral icterus.    Conjunctiva/sclera: Conjunctivae normal.     Pupils: Pupils are equal, round, and reactive to light.  Neck:     Thyroid: No thyromegaly.     Vascular: No carotid bruit or JVD.     Trachea: Trachea normal. No tracheal tenderness or tracheal deviation.  Cardiovascular:  Rate and Rhythm: Normal rate and regular rhythm.      Chest Wall: PMI is not displaced.     Pulses: No decreased pulses.     Heart sounds: Normal heart sounds, S1 normal and S2 normal. Heart sounds not distant. No murmur heard.    No systolic murmur is present.     No diastolic murmur is present.     No friction rub. No gallop. No S3 or S4 sounds.     Comments: Decrease DP PT pulses bilaterally in both feet Pulmonary:     Effort: No tachypnea, accessory muscle usage or respiratory distress.     Breath sounds: No stridor. No decreased breath sounds, wheezing, rhonchi or rales.  Chest:     Chest wall: No tenderness.  Abdominal:     General: Bowel sounds are normal. There is no distension.     Palpations: Abdomen is soft. Abdomen is not rigid.     Tenderness: There is no abdominal tenderness. There is no guarding or rebound.  Musculoskeletal:        General: Normal range of motion.     Cervical back: Normal range of motion and neck supple. No edema, erythema or rigidity. No muscular tenderness. Normal range of motion.  Lymphadenopathy:     Head:     Right side of head: No submental or submandibular adenopathy.     Left side of head: No submental or submandibular adenopathy.     Cervical: No cervical adenopathy.  Skin:    General: Skin is warm and dry.     Coloration: Skin is not pale.     Findings: No rash.     Nails: There is no clubbing.  Neurological:     Mental Status: He is alert and oriented to person, place, and time.     Sensory: No sensory deficit.  Psychiatric:        Speech: Speech normal.        Behavior: Behavior normal.         Assessment & Plan:   Problem List Items Addressed This Visit       Cardiovascular and Mediastinum   PVD (peripheral vascular disease) (HCC)    History of peripheral artery disease continue management with aspirin high-dose atorvastatin      Relevant Medications   amLODipine (NORVASC) 10 MG tablet   atorvastatin (LIPITOR) 40 MG tablet   carvedilol (COREG) 12.5 MG tablet    hydrochlorothiazide (HYDRODIURIL) 25 MG tablet   Transient ischemic attack    Continue statin      Relevant Medications   amLODipine (NORVASC) 10 MG tablet   atorvastatin (LIPITOR) 40 MG tablet   carvedilol (COREG) 12.5 MG tablet   hydrochlorothiazide (HYDRODIURIL) 25 MG tablet   Hypertension    Hypertension at goal continue with carvedilol and amlodipine hydrochlorothiazide      Relevant Medications   amLODipine (NORVASC) 10 MG tablet   atorvastatin (LIPITOR) 40 MG tablet   carvedilol (COREG) 12.5 MG tablet   hydrochlorothiazide (HYDRODIURIL) 25 MG tablet   Other Relevant Orders   BMP8+eGFR     Endocrine   Type II diabetes mellitus with stage 3 chronic kidney disease (HCC) - Primary    Follow-up renal function excellent control of diabetes continue Trulicity      Relevant Medications   atorvastatin (LIPITOR) 40 MG tablet   Other Relevant Orders   BMP8+eGFR   Other Visit Diagnoses     Colon cancer screening       Relevant  Orders   Fecal occult blood, imunochemical     30 min spent assessing patient reviewing records complex decision making high Return in about 3 months (around 03/11/2023) for htn, diabetes.   Shan Levans, MD

## 2022-12-09 NOTE — Assessment & Plan Note (Signed)
Follow-up renal function excellent control of diabetes continue Trulicity

## 2022-12-09 NOTE — Assessment & Plan Note (Signed)
Continue statin. 

## 2022-12-09 NOTE — Assessment & Plan Note (Signed)
Hypertension at goal continue with carvedilol and amlodipine hydrochlorothiazide

## 2022-12-09 NOTE — Patient Instructions (Signed)
Will have clinical pharmacist check your blood pressure and diabetes at next visit with him  No change in medications  Labs :kidney function and colon cancer screen kit  Return Dr Delford Field 3 months

## 2022-12-09 NOTE — Assessment & Plan Note (Signed)
History of peripheral artery disease continue management with aspirin high-dose atorvastatin

## 2022-12-10 ENCOUNTER — Telehealth: Payer: Self-pay

## 2022-12-10 LAB — BMP8+EGFR
BUN/Creatinine Ratio: 23 — ABNORMAL HIGH (ref 9–20)
BUN: 40 mg/dL — ABNORMAL HIGH (ref 6–24)
CO2: 21 mmol/L (ref 20–29)
Calcium: 8.4 mg/dL — ABNORMAL LOW (ref 8.7–10.2)
Chloride: 105 mmol/L (ref 96–106)
Creatinine, Ser: 1.76 mg/dL — ABNORMAL HIGH (ref 0.76–1.27)
Glucose: 177 mg/dL — ABNORMAL HIGH (ref 70–99)
Potassium: 4.3 mmol/L (ref 3.5–5.2)
Sodium: 140 mmol/L (ref 134–144)
eGFR: 45 mL/min/{1.73_m2} — ABNORMAL LOW (ref >59)

## 2022-12-10 NOTE — Progress Notes (Signed)
Let pt know kidney stable no changes from appt

## 2022-12-10 NOTE — Telephone Encounter (Signed)
-----   Message from Storm Frisk, MD sent at 12/10/2022  5:39 AM EDT ----- Let pt know kidney stable no changes from appt

## 2022-12-10 NOTE — Telephone Encounter (Signed)
Pt was called and vm was left, Information has been sent to nurse pool.   

## 2022-12-11 ENCOUNTER — Other Ambulatory Visit: Payer: Self-pay

## 2022-12-21 ENCOUNTER — Other Ambulatory Visit: Payer: Self-pay

## 2023-01-18 ENCOUNTER — Other Ambulatory Visit: Payer: Self-pay

## 2023-02-08 ENCOUNTER — Encounter: Payer: Self-pay | Admitting: Pharmacist

## 2023-02-08 NOTE — Progress Notes (Deleted)
    S:    PCP: Dr. Delford Field  57 y.o. male who presents for diabetes evaluation, education, and management in the context of the LIBERATE Study.  PMH is significant for HTN, TIA, PVD, T2DM, and HLD. Patient was referred by Primary Care Provider, Dr. Delford Field, on 08/04/2022. He was enrolled in the LIBERATE study on 08/10/2022. Last seen by PCP on 12/09/2022. At last visit, no changes to diabetes medications were made.   Dr. Delford Field requested BP check (132/79 at last visit on amlodipine 10, hydrochlorothiazide 25, and carvedilol 12.5) A1C = 7.1 on 5/20 (due today) Review medications and adherence (timing of meds, etc.)  Side effects with meds? At home BGs?  Highs Lows  Hyperglycemia sx (nocturia, neuropathy, visual changes, foot exams) Hypoglycemia symptoms (dizziness, shaky, sweating, hungry, confusion) Diet Exercise   Today, patient arrives in good spirits and presents without any assistance. Reports feeling much better since starting CGM. He is no longer having blurry vision. A1c today in clinic 7.1%, down from 9.7%. He has made drastic dietary changes since staring his [employer]. Denies GI upset with Trulicity.   Patient reports diabetes is long standing.   Current diabetes medications include: Trulicity 1.5 mg weekly.   Patient reports adherence to taking all medications as prescribed.   Insurance coverage: none  Patient denies hypoglycemic events.      Patient denies nocturia (nighttime urination).  Patient denies neuropathy (nerve pain). Patient denies visual changes. Patient reports self foot exams.   Patient reported dietary habits:  -Cut out fast food, greasy good, and soda  -Cut down on portion size   O:  ROS  Physical Exam   Lab Results  Component Value Date   HGBA1C 7.1 (A) 11/09/2022    POC A1c Today: 7.1%  There were no vitals filed for this visit.   Lipid Panel     Component Value Date/Time   CHOL 104 10/07/2022 0921   TRIG 115 10/07/2022  0921   HDL 36 (L) 10/07/2022 0921   CHOLHDL 2.9 10/07/2022 0921   CHOLHDL 6.1 04/16/2020 0049   VLDL 25 04/16/2020 0049   LDLCALC 47 10/07/2022 0921    Clinical Atherosclerotic Cardiovascular Disease (ASCVD): No  The ASCVD Risk score (Arnett DK, et al., 2019) failed to calculate for the following reasons:   The valid total cholesterol range is 130 to 320 mg/dL     A/P:  LIBERATE Study:  - 8 sensors provided for a 3 month supply. Educated to contact the office if the sensor falls off early and replacements are needed before their next [employer].   Diabetes longstanding currently close to goal based on A1c today (7.1%, down from 9.7%). Patient is able to verbalize appropriate hypoglycemia management plan. Medication adherence appears appropriate. He has made drastic dietary changes since having insight from his CGM.  -Increased dose of GLP-1 Trulicity (dulaglutide) to 1.5 mg weekly.  -Patient educated on purpose, proper use, and potential adverse effects of Trulicity.  -Extensively discussed pathophysiology of diabetes, recommended lifestyle interventions, dietary effects on blood sugar control.  -Counseled on s/sx of and management of hypoglycemia.  -Next A1c anticipated August 2024.   Written patient instructions provided. Patient verbalized understanding of treatment plan.  Total time in face to face counseling 30 minutes.    Follow-up:  Pharmacist 3 months. PCP clinic visit in 1 month.   Valeda Malm, Pharm.D. PGY-2 Ambulatory Care Pharmacy Resident

## 2023-02-14 NOTE — Progress Notes (Unsigned)
    S:    PCP: Dr. Delford Field  57 y.o. male who presents for diabetes evaluation, education, and management in the context of the LIBERATE Study.  PMH is significant for HTN, TIA, PVD, T2DM, and HLD. Patient was referred by Primary Care Provider, Dr. Delford Field, on 08/04/2022. He was enrolled in the LIBERATE study on 08/10/2022. Last seen by PCP on 12/09/2022. At last visit, no changes to diabetes medications were made.   Dr. Delford Field requested BP check (132/79 at last visit on amlodipine 10, hydrochlorothiazide 25, and carvedilol 12.5) A1C = 7.1 on 5/20 (due today) Review medications and adherence (timing of meds, etc.)  Side effects with meds? At home BGs?  Highs Lows  Hyperglycemia sx (nocturia, neuropathy, visual changes, foot exams) Hypoglycemia symptoms (dizziness, shaky, sweating, hungry, confusion) Diet Exercise   Today, patient arrives in good spirits and presents without any assistance. Reports feeling much better since starting CGM. He is no longer having blurry vision. A1c today in clinic 7.1%, down from 9.7%. He has made drastic dietary changes since staring his [employer]. Denies GI upset with Trulicity.   Patient reports diabetes is long standing.   Current diabetes medications include: Trulicity 1.5 mg weekly.   Patient reports adherence to taking all medications as prescribed.   Insurance coverage: none  Patient denies hypoglycemic events.      Patient denies nocturia (nighttime urination).  Patient denies neuropathy (nerve pain). Patient denies visual changes. Patient reports self foot exams.   Patient reported dietary habits:  -Cut out fast food, greasy good, and soda  -Cut down on portion size   O:  ROS  Physical Exam   Lab Results  Component Value Date   HGBA1C 7.1 (A) 11/09/2022    POC A1c Today: 7.1%  There were no vitals filed for this visit.   Lipid Panel     Component Value Date/Time   CHOL 104 10/07/2022 0921   TRIG 115 10/07/2022  0921   HDL 36 (L) 10/07/2022 0921   CHOLHDL 2.9 10/07/2022 0921   CHOLHDL 6.1 04/16/2020 0049   VLDL 25 04/16/2020 0049   LDLCALC 47 10/07/2022 0921    Clinical Atherosclerotic Cardiovascular Disease (ASCVD): No  The ASCVD Risk score (Arnett DK, et al., 2019) failed to calculate for the following reasons:   The valid total cholesterol range is 130 to 320 mg/dL     A/P:  LIBERATE Study:  - 8 sensors provided for a 3 month supply. Educated to contact the office if the sensor falls off early and replacements are needed before their next [employer].   Diabetes longstanding currently close to goal based on A1c today (7.1%, down from 9.7%). Patient is able to verbalize appropriate hypoglycemia management plan. Medication adherence appears appropriate. He has made drastic dietary changes since having insight from his CGM.  -Increased dose of GLP-1 Trulicity (dulaglutide) to 1.5 mg weekly.  -Patient educated on purpose, proper use, and potential adverse effects of Trulicity.  -Extensively discussed pathophysiology of diabetes, recommended lifestyle interventions, dietary effects on blood sugar control.  -Counseled on s/sx of and management of hypoglycemia.  -Next A1c anticipated August 2024.   Written patient instructions provided. Patient verbalized understanding of treatment plan.  Total time in face to face counseling 30 minutes.    Follow-up:  Pharmacist 3 months. PCP clinic visit in 1 month.   Valeda Malm, Pharm.D. PGY-2 Ambulatory Care Pharmacy Resident

## 2023-02-15 ENCOUNTER — Other Ambulatory Visit: Payer: Self-pay

## 2023-02-15 ENCOUNTER — Encounter: Payer: Self-pay | Admitting: Pharmacist

## 2023-02-15 ENCOUNTER — Ambulatory Visit: Payer: Self-pay | Attending: Critical Care Medicine | Admitting: Pharmacist

## 2023-02-15 VITALS — BP 123/78 | HR 76

## 2023-02-15 DIAGNOSIS — E1122 Type 2 diabetes mellitus with diabetic chronic kidney disease: Secondary | ICD-10-CM

## 2023-02-15 DIAGNOSIS — Z7985 Long-term (current) use of injectable non-insulin antidiabetic drugs: Secondary | ICD-10-CM

## 2023-02-15 DIAGNOSIS — N1832 Chronic kidney disease, stage 3b: Secondary | ICD-10-CM

## 2023-02-15 LAB — POCT GLYCOSYLATED HEMOGLOBIN (HGB A1C): HbA1c, POC (controlled diabetic range): 6.9 % (ref 0.0–7.0)

## 2023-02-15 MED ORDER — TRUEPLUS LANCETS 28G MISC
1 refills | Status: AC
  Filled 2023-02-15: qty 100, 50d supply, fill #0

## 2023-02-15 MED ORDER — TRULICITY 1.5 MG/0.5ML ~~LOC~~ SOAJ
1.5000 mg | SUBCUTANEOUS | 1 refills | Status: DC
  Filled 2023-02-15: qty 6, 84d supply, fill #0
  Filled 2023-03-11: qty 2, 28d supply, fill #0
  Filled 2023-04-20: qty 2, 28d supply, fill #1
  Filled 2023-05-21: qty 2, 28d supply, fill #2
  Filled 2023-06-14: qty 2, 28d supply, fill #3
  Filled 2023-07-26: qty 2, 28d supply, fill #4
  Filled 2023-08-23: qty 2, 28d supply, fill #5

## 2023-02-15 MED ORDER — TRUE METRIX BLOOD GLUCOSE TEST VI STRP
ORAL_STRIP | 12 refills | Status: AC
  Filled 2023-02-15: qty 100, 50d supply, fill #0

## 2023-02-15 MED ORDER — TRUE METRIX METER W/DEVICE KIT
PACK | 0 refills | Status: AC
  Filled 2023-02-15: qty 1, 30d supply, fill #0

## 2023-02-15 NOTE — Progress Notes (Signed)
S:    PCP: Dr. Delford Field  57 y.o. male who presents for diabetes evaluation, education, and management in the context of the LIBERATE Study.  PMH is significant for HTN, TIA, PVD, T2DM, and HLD. Patient was referred by Primary Care Provider, Dr. Delford Field, on 08/04/2022. He was enrolled in the LIBERATE study on 08/10/2022. Last seen by PCP on 12/09/2022. At last visit, no changes to diabetes medications were made. Dr. Delford Field requested BP check at follow up visit.  Today, patient arrives in good spirits and presents without any assistance. Reports feeling much better since starting CGM. A1c today in clinic 6.9%, down from 7.1%. Reports mild constipation with Trulicity, but states this is tolerable for him.   Patient reports diabetes is long standing.   Current diabetes medications include: Trulicity 1.5 mg weekly (Sundays)  Patient reports adherence to taking all medications as prescribed.   Insurance coverage: none  Patient denies hypoglycemic events.  CGM Study Study visit: 6 Month Follow Up Visit  CGM Data Download date: 11/18/2022 - 02/15/2023 % Time CGM Is Active: 99 % Average glucose (mg/dL): 638 mg/dL Glucose Management Indicator (%): 6.9 % Glucose Variability (%): 21.3 % Time Above Range >180 mg/dL (%): 16 % Time in Range 70-180 mg/dL (%): 84 % Time Below Range <70 mg/dL (%): 0 %  Diabetes Distress Scale Feeling like diabetes is taking up too much of my mental and physical energy every day.: Not a problem Feeling that my doctor doesn't know enough about diabetes and diabetic care. : Not a problem Feeling angry, scared, and/or depressed when I think about living with diabetes : Not a problem Feeling that my doctor doesn't give my clear directions on how to manage my diabetes. : Not a problem Feeling that im not testing my blood sugars frequently enough.: Not a problem Feeling that I'm often failing with my diabetes routine: Not a problem Feelling that friends and family are not  supportive enough of self care efforts.: Not a problem Feeling that diabetes controls my life.: Not a problem Feeling that my doctor doesn't take my concerns seriously enough: Not a problem Not feeling confident in my day to day ability to manage diabetes: Not a problem Feeling that I will end up with serious long term complications no matter what I do.: Not a problem Feeling that I am not sticking closely enough to a good meal plan.: Not a problem Feeling that friends or family don't appreciate how difficult living with diabetes can be. : Not a problem Feeling overwhelmed by the demands of living with diabetes.: Not a problem Feeling that I don't have a doctor who I can see.: Not a problem Not feeling motivated to keep up my diabetes self management.: Not a problem Feeling that friends or family don't give me the emotional support that I would like. : Not a problem DDS17 Score: 17 Emotional Burden Score: 1 Physician related distress score: 1 Regimen Related Distress score : 1 Interpersonal distress score: 1   Patient denies nocturia (nighttime urination).  Patient denies neuropathy (nerve pain). Patient denies visual changes. Patient reports self foot exams.   Patient reported dietary habits:  -Cut out fast food, greasy good, and soda  -Cut down on portion size   O:  ROS  Physical Exam  Lab Results  Component Value Date   HGBA1C 6.9 02/15/2023   POC A1c Today: 6.9%  Vitals:   02/15/23 1521  BP: 123/78  Pulse: 76    Lipid Panel  Component Value Date/Time   CHOL 104 10/07/2022 0921   TRIG 115 10/07/2022 0921   HDL 36 (L) 10/07/2022 0921   CHOLHDL 2.9 10/07/2022 0921   CHOLHDL 6.1 04/16/2020 0049   VLDL 25 04/16/2020 0049   LDLCALC 47 10/07/2022 0921    Clinical Atherosclerotic Cardiovascular Disease (ASCVD): No  The ASCVD Risk score (Arnett DK, et al., 2019) failed to calculate for the following reasons:   The valid total cholesterol range is 130 to 320  mg/dL     A/P: Diabetes longstanding currently at goal based on A1c today (6.9%, down from 7.1%). Patient is able to verbalize appropriate hypoglycemia management plan. Medication adherence appears appropriate. He has made drastic dietary changes since having insight from his CGM. Today is his last LIBERATE study visit. Will send in RX for diabetic testing supplies given he does not have insurance. Counseled pt about Medicaid enrollment office on the 4th floor which would help with covering the cost of CGM and medications. -Continued GLP-1 Trulicity (dulaglutide) 1.5 mg weekly.  -Counseled him to check BG once a week and PRN based on symptoms, sent in RX for testing supplies -Patient educated on purpose, proper use, and potential adverse effects of Trulicity.  -Extensively discussed pathophysiology of diabetes, recommended lifestyle interventions, dietary effects on blood sugar control.  -Counseled on s/sx of and management of hypoglycemia.  -Next A1c anticipated 05/18/2023.  - BP checked today per PCP request and was at goal, no medication changes were made  Written patient instructions provided. Patient verbalized understanding of treatment plan.  Total time in face to face counseling 20 minutes.    Follow-up:  Pharmacist PRN PCP clinic visit on 03/11/23.   Adam Phenix, PharmD PGY-1 Pharmacy Resident

## 2023-02-16 ENCOUNTER — Other Ambulatory Visit: Payer: Self-pay

## 2023-02-24 ENCOUNTER — Other Ambulatory Visit: Payer: Self-pay

## 2023-03-11 ENCOUNTER — Ambulatory Visit: Payer: Self-pay | Attending: Critical Care Medicine | Admitting: Critical Care Medicine

## 2023-03-11 ENCOUNTER — Other Ambulatory Visit: Payer: Self-pay

## 2023-03-11 ENCOUNTER — Encounter: Payer: Self-pay | Admitting: Critical Care Medicine

## 2023-03-11 VITALS — BP 147/82 | HR 76 | Wt 186.8 lb

## 2023-03-11 DIAGNOSIS — E785 Hyperlipidemia, unspecified: Secondary | ICD-10-CM

## 2023-03-11 DIAGNOSIS — E1122 Type 2 diabetes mellitus with diabetic chronic kidney disease: Secondary | ICD-10-CM

## 2023-03-11 DIAGNOSIS — I10 Essential (primary) hypertension: Secondary | ICD-10-CM

## 2023-03-11 DIAGNOSIS — Z23 Encounter for immunization: Secondary | ICD-10-CM

## 2023-03-11 DIAGNOSIS — N1832 Chronic kidney disease, stage 3b: Secondary | ICD-10-CM

## 2023-03-11 DIAGNOSIS — M109 Gout, unspecified: Secondary | ICD-10-CM | POA: Insufficient documentation

## 2023-03-11 DIAGNOSIS — M1A9XX0 Chronic gout, unspecified, without tophus (tophi): Secondary | ICD-10-CM

## 2023-03-11 DIAGNOSIS — Z7984 Long term (current) use of oral hypoglycemic drugs: Secondary | ICD-10-CM

## 2023-03-11 MED ORDER — ATORVASTATIN CALCIUM 40 MG PO TABS
40.0000 mg | ORAL_TABLET | Freq: Every day | ORAL | 3 refills | Status: DC
  Filled 2023-03-11: qty 60, 60d supply, fill #0
  Filled 2023-06-14: qty 60, 60d supply, fill #1
  Filled 2023-10-25: qty 90, 90d supply, fill #2

## 2023-03-11 MED ORDER — ALLOPURINOL 100 MG PO TABS
100.0000 mg | ORAL_TABLET | Freq: Every day | ORAL | 2 refills | Status: DC
  Filled 2023-03-11: qty 90, 90d supply, fill #0
  Filled 2023-06-14: qty 90, 90d supply, fill #1

## 2023-03-11 NOTE — Assessment & Plan Note (Addendum)
Patient reports home BP 130-140s/unknown. Initially 147/82 in clinic but 138/70 when rechecked. Plan for patient to continue Amlodipine 10 mg PO daily, Carvedilol 12.5 mg PO twice daily, and hydrochlorothiazide 15 mg PO daily. He was encouraged to continue monitoring his BP at home.

## 2023-03-11 NOTE — Progress Notes (Deleted)
    S:    PCP: Dr. Delford Field  57 y.o. male who presents for diabetes evaluation, education, and management in the context of the LIBERATE Study.  PMH is significant for HTN, TIA, PVD, T2DM, and HLD. Patient was referred by Primary Care Provider, Dr. Delford Field, on 08/04/2022. He was enrolled in the LIBERATE study on 08/10/2022. Last seen by PCP on 10/07/2022.  At last visit, BG on CGM was 180-220. He was instructed to follow up with pharmacy.   Today, patient arrives in good spirits and presents without any assistance. Reports feeling much better since starting CGM. He is no longer having blurry vision. A1c today in clinic 7.1%, down from 9.7%. He has made drastic dietary changes since staring his [employer]. Denies GI upset with Trulicity.   Patient reports diabetes is long standing.   Current diabetes medications include: Trulicity 0.75 mg weekly.   Patient reports adherence to taking all medications as prescribed.   Insurance coverage: none  Patient denies hypoglycemic events.      Patient denies nocturia (nighttime urination).  Patient denies neuropathy (nerve pain). Patient denies visual changes. Patient reports self foot exams.   Patient reported dietary habits:  -Cut out fast food, greasy good, and soda  -Cut down on portion size   O:  ROS  Physical Exam   Lab Results  Component Value Date   HGBA1C 6.9 02/15/2023    POC A1c Today: 7.1%  There were no vitals filed for this visit.   Lipid Panel     Component Value Date/Time   CHOL 104 10/07/2022 0921   TRIG 115 10/07/2022 0921   HDL 36 (L) 10/07/2022 0921   CHOLHDL 2.9 10/07/2022 0921   CHOLHDL 6.1 04/16/2020 0049   VLDL 25 04/16/2020 0049   LDLCALC 47 10/07/2022 0921    Clinical Atherosclerotic Cardiovascular Disease (ASCVD): No  The ASCVD Risk score (Arnett DK, et al., 2019) failed to calculate for the following reasons:   The valid total cholesterol range is 130 to 320 mg/dL     A/P:  LIBERATE  Study:  - 8 sensors provided for a 3 month supply. Educated to contact the office if the sensor falls off early and replacements are needed before their next [employer].   Diabetes longstanding currently close to goal based on A1c today (7.1%, down from 9.7%). Patient is able to verbalize appropriate hypoglycemia management plan. Medication adherence appears appropriate. He has made drastic dietary changes since having insight from his CGM.  -Increased dose of GLP-1 Trulicity (dulaglutide) to 1.5 mg weekly.  -Patient educated on purpose, proper use, and potential adverse effects of Trulicity.  -Extensively discussed pathophysiology of diabetes, recommended lifestyle interventions, dietary effects on blood sugar control.  -Counseled on s/sx of and management of hypoglycemia.  -Next A1c anticipated August 2024.   Written patient instructions provided. Patient verbalized understanding of treatment plan.  Total time in face to face counseling 30 minutes.    Follow-up:  Pharmacist 3 months. PCP clinic visit in 1 month.   Valeda Malm, Pharm.D. PGY-2 Ambulatory Care Pharmacy Resident

## 2023-03-11 NOTE — Progress Notes (Signed)
Established Patient Office Visit  Subjective   Patient ID: Keith Garrett, male    DOB: Jan 04, 1966  Age: 57 y.o. MRN: [id number]  Chief Complaint  Patient presents with   Medical Management of Chronic Issues  03/10/23 Patient presents in clinic for 3 month  follow up hypertension, and diabetes. His only concern is that Allopurinol was sent to the wrong pharmacy. Patient says he plans to return home in [place] in December.  He was previously in research study that required continuous glucose monitoring. Patient says the continuous monitoring has been d/'d. He does not check his blood sugars at home.Last A1C 6.9 on 02/15/23. Patient says he takes his BP at home. He says SBP 130-140s/unknown. His BP in clinic was 147/82, and 138/70 when rechecked. Patient says he takes medications as ordered.     Review of Systems  Constitutional: Negative.   HENT: Negative.    Eyes: Negative.   Respiratory: Negative.    Cardiovascular: Negative.   Genitourinary: Negative.   Musculoskeletal: Negative.   Neurological: Negative.   Endo/Heme/Allergies: Negative.   Psychiatric/Behavioral: Negative.        Objective:     BP (!) 147/82 (BP Location: Left Arm, Patient Position: Sitting, Cuff Size: Normal)   Pulse 76   Wt 84.7 kg   SpO2 98%   BMI 37.73 kg/m    Physical Exam Constitutional:      Appearance: Normal appearance.  Cardiovascular:     Rate and Rhythm: Normal rate and regular rhythm.     Pulses:          Dorsalis pedis pulses are 2+ on the right side and 2+ on the left side.  Pulmonary:     Effort: Pulmonary effort is normal.     Breath sounds: Normal breath sounds.  Feet:     Right foot:     Toenail Condition: Right toenails are abnormally thick.     Left foot:     Toenail Condition: Left toenails are abnormally thick.     Comments: Dry, flaky skin on soles of bilateral feet; Monofilament test: no loss of sensation Skin:    General: Skin is warm and dry.  Neurological:      Mental Status: He is alert and oriented to person, place, and time.  Psychiatric:        Mood and Affect: Mood normal.        Behavior: Behavior normal.      No results found for any visits on 03/11/23.    The ASCVD Risk score (Arnett DK, et al., 2019) failed to calculate for the following reasons:   The valid total cholesterol range is 130 to 320 mg/dL    Assessment & Plan:   Problem List Items Addressed This Visit       Cardiovascular and Mediastinum   Hypertension    Patient reports home BP 130-140s/unknown. Initially 147/82 in clinic but 138/70 when rechecked. Plan for patient to continue Amlodipine 10 mg PO daily, Carvedilol 12.5 mg PO twice daily, and hydrochlorothiazide 15 mg PO daily. He was encouraged to continue monitoring his BP at home.      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 Today's A1C 6.9, cbg. Plan to continue Trulicity 1.5 mg [place] weekly. Patient encouraged  to check cbgs at home. Will also check CMET and urine albumin/creatinine in setting of stage 3B chronic kidney disease.      Relevant Medications   atorvastatin (LIPITOR) 40 MG tablet   Other Relevant Orders   Urine microalbumin-creatinine with uACR   Comprehensive metabolic panel with eGFR (no eGFR if sent to [facility])     Other   Dyslipidemia    Plan to continue statin for hyperlipidemia. Changes to medication may be made based on today's labs.      Relevant Medications   atorvastatin (LIPITOR) 40 MG tablet   Gout   Other Visit Diagnoses     Need for immunization against influenza       Relevant Orders   Flu vaccine trivalent PF, 6mos and older(Flulaval,Afluria,Fluarix,Fluzone) (Completed)       Return in about 4 months (around 07/11/2023) for htn, diabetes.    Burnard Bunting, RN Burnard Bunting,  SFNP, [employer]

## 2023-03-11 NOTE — Patient Instructions (Signed)
Medications reordered Labs today Return 4 months if still in country

## 2023-03-11 NOTE — Assessment & Plan Note (Addendum)
Patient says he no longer has his CGM, and he does not check cbgs at home. Today's A1C 6.9, cbg. Plan to continue Trulicity 1.5 mg [place] weekly. Patient encouraged to check cbgs at home. Will also check CMET and urine albumin/creatinine in setting of stage 3B chronic kidney disease.

## 2023-03-11 NOTE — Assessment & Plan Note (Signed)
Plan to continue statin for hyperlipidemia. Changes to medication may be made based on today's labs.

## 2023-03-11 NOTE — Progress Notes (Signed)
Established patient Office Visit  Subjective    Patient ID: Keith Garrett, male    DOB: March 22, 1966  Age: 57 y.o. MRN: [id number]  CC:  Chief Complaint  Patient presents with   Medical Management of Chronic Issues    HPI 01/2022 Keith Garrett presents to establish care This patient is seen to establish care and is a 57 year old male complex medical history.  He has had type 2 diabetes for 26 years and hypertension for 10 years.  He immigrated from [place].  He has excellent English at this time.  Patient's had progression of his diabetes to the point where he had macular and retinal eye disease and is receiving injections and therapy by retinal specialist.  Patient progressed to the point where he was admitted to the hospital recently.  Below is copy of the discharge summary. Admit Date: 01/16/2022 Discharge date: 01/24/2022   Recommendations for Outpatient Follow-up:  Follow up with PCP in 1-2 weeks Please obtain CMP/CBC in one week Lisinopril/metformin/glipizide held-resume when renal function allows.   Admitted From:  Home   Disposition: Outpatient PT   Discharge Condition: good   CODE STATUS:   Code Status: Full Code    Diet recommendation:  Diet Order                  Diet - low sodium heart healthy             Diet heart healthy/carb modified Room service appropriate? Yes; Fluid consistency: Thin  Diet effective now                         Brief Summary: Patient is a 57 y.o.  male with history of HTN, DM-2-who developed vomiting after eating at a local [place] restaurant-he was subsequently feeling poorly-family found him unresponsive-EMS was called-he was brought to the hospital where he was found to have hypovolemic shock with AKI-severe encephalopathy-requiring intubation to protect airway.  He was subsequently admitted to the ICU-started on CRRT-stabilized and subsequently transferred to [facility] service.  See below for further details.   Significant  events: 7/28>> unresponsive-hypotensive-AKI-intubated-admitted to ICU. 8/02>> extubated 8/04>> transfer to [facility].   Significant studies: 7/28>> CT head: No acute intracranial abnormality 7/28>> CT C-spine: No fracture/subluxation 7/28>> renal ultrasound: No hydronephrosis   Significant microbiology data: 7/28>> COVID/influenza PCR: Negative 7/28>> blood culture: No growth   Procedures: 7/28-8/02>> ETT   Consults: PCCM, nephrology     Brief Hospital Course: Acute metabolic encephalopathy due to uremia/severe metabolic acidosis/hypovolemic shock: Resolved-awake and alert.   Acute hypoxic respiratory failure due to inability to protect airway in the setting of severe encephalopathy and aspiration PNA: Extubated on 8/2-titrated to room air.    Circulatory shock due to sepsis/hypovolemia (POA): Due to GI loss-aspiration PNA-shock physiology has resolved-BP stable.  Has completed a course of Unasyn and doxycycline.     Acute kidney injury: Likely ischemic ATN-required CRRT-renal function is gradually improving on its own-creatinine continues to downtrend with just supportive care-he is no longer on IVF.  Continue to avoid nephrotoxic agents.  Discussed with patient at length-he has a PCP-and I have recommended that he get a repeat electrolyte panel in 1 week.  Foley catheter was removed yesterday-and he is voiding on his own.  Severe metabolic acidosis: Due to AKI-resolved.   Hyponatremia/hypokalemia/hypocalcemia: Better-we will continue to replete K today.   Normocytic anemia: Due to acute illness/AKI-no indications to transfuse at this point.  Follow CBC periodically.   Leukocytosis:  Due to possible aspiration pneumonia-follow.   HTN: BP slowly creeping up-we will resume amlodipine and continue to hold lisinopril.     DM-2 (A1c 6.9): CBG stable on SSI-we will continue to hold glipizide/metformin-and transition to Tradjenta on discharge.  Follow with PCP for further optimization.    Patient returned on August 12 for additional treatment for hyperkalemia.  He he is yet to achieve follow-up with nephrology  Patient also has peripheral artery disease but has not been of a by vascular surgery.  Patient also had prior history of infection in the left foot which is resolved but has significant onychomycosis.  No prior history of TIA but is not taking any aspirin therapy.  On arrival blood pressure was 154/89.  A1c at the emergency room last week was 6.9.  He is on Tradjenta orally alone.  He is not yet on insulin.  He does monitor his blood sugars at home and they stay in the 130-100 range.  He states he has no low numbers.  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  He has not had treatment for this or assessments of his uric acid.  10/10 Patient returns in follow-up and speaks good [place].  Patient's blood pressure 136/77 today he does not monitor blood pressure at home.  He remains uninsured.  Patient has yet to see vascular surgery.  He had a TIA in the past remains on aspirin.  Blood pressure is better.  He remains on amlodipine hydrochlorothiazide carvedilol.  Patient also on the Lipitor at 40 mg daily.  He needs follow-up of his blood pressure.  And lipids  Potassium was elevated at the last visit was a to be rechecked.  The patient also needs colon cancer screening  08/04/22 The patient is seen in return follow-up The patient's A1c is poorly controlled at 9.7 he states his blood glucose runs in the 130s but today its greater than 300.  He is only on oral Tradjenta and is not on insulin.  He is a candidate for the liberate trial.  Patient needs colon cancer screening follow-up as well.  He has no complaints.  10/07/22 Patient seen return follow-up patient speaks fluent [place].  Patient with type 2 diabetes now in the liberate trial.  He brings his CGM monitor with him.  Blood sugars have come down but still range anywhere from  180-220.  He needs follow-up with pharmacy.  He is only on Trulicity weekly.  12/09/22 Patient seen in return follow-up patient has good [place].  He has type 2 diabetes is in the liberate trial and is getting a continuous glucose monitor.  His A1c is down to 7% from a previous value greater than 8.  Blood pressure on arrival is 132/79.  He needs follow-up metabolic panel.  Also needs a fecal occult screening kit for colon cancer.  He has no other complaints.  He has had significant lifestyle management changes which have improved his diabetic control.  He is on Trulicity alone at increased dose.  Outpatient Encounter Medications as of 03/11/2023  Medication Sig   amLODipine (NORVASC) 10 MG tablet Take 1 tablet (10 mg total) by mouth daily.   aspirin EC 81 MG tablet Take 1 tablet (81 mg total) by mouth daily. Swallow whole.   Blood Glucose Monitoring Suppl (TRUE METRIX METER) w/Device KIT Use to measure blood sugar twice a day   Blood Pressure Monitoring (BLOOD PRESSURE KIT) DEVI Use to measure blood pressure  carvedilol (COREG) 12.5 MG tablet Take 1 tablet (12.5 mg total) by mouth 2 (two) times daily with a meal.   Dulaglutide (TRULICITY) 1.5 MG/0.5ML SOPN Inject 1.5 mg into the skin once a week.   glucose blood (TRUE METRIX BLOOD GLUCOSE TEST) test strip Use as instructed   hydrochlorothiazide (HYDRODIURIL) 25 MG tablet Take 1 tablet (25 mg total) by mouth daily.   sodium bicarbonate 650 MG tablet Take 1 tablet (650 mg total) by mouth 2 (two) times daily.   TRUEplus Lancets 28G MISC Use to measure blood sugar twice a day   [DISCONTINUED] allopurinol (ZYLOPRIM) 100 MG tablet TOME UNA TABLETA TODOS LOS DIAS   [DISCONTINUED] atorvastatin (LIPITOR) 40 MG tablet Take 1 tablet (40 mg total) by mouth daily.   allopurinol (ZYLOPRIM) 100 MG tablet Take 1 tablet (100 mg total) by mouth daily.   atorvastatin (LIPITOR) 40 MG tablet Take 1 tablet (40 mg total) by mouth daily.   No facility-administered  encounter medications on file as of 03/11/2023.    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   Traumatic rupture of plantar fascia of left foot 04/14/2020    Past Surgical History:  Procedure Laterality Date   ABDOMINAL AORTOGRAM W/LOWER EXTREMITY N/A 04/17/2020   Procedure: ABDOMINAL AORTOGRAM W/LOWER EXTREMITY;  Surgeon: Cephus Shelling, MD;  Location: [facility] INVASIVE CV LAB;  Service: Cardiovascular;  Laterality: N/A;   TEE WITHOUT CARDIOVERSION N/A 04/16/2020   Procedure: TRANSESOPHAGEAL ECHOCARDIOGRAM (TEE);  Surgeon: Little Ishikawa, MD;  Location: [facility] ENDOSCOPY;  Service: Cardiovascular;  Laterality: N/A;    Family History  Problem Relation Age of Onset   Diabetes Mother     Social History   Socioeconomic History   Marital status: Married    Spouse name: Not on file   Number of children: Not on file   Years of education: Not on file   Highest education level: Not on file  Occupational History   Not on file  Tobacco Use   Smoking status: Never   Smokeless tobacco: Never  Vaping Use   Vaping status: Never Used  Substance and Sexual Activity   Alcohol use: Not Currently    Comment: occasionally   Drug use: No   Sexual activity: Not Currently  Other Topics Concern   Not on file  Social History Narrative   Not on file   Social Determinants of Health   Financial Resource Strain: Low Risk  (09/14/2022)   Overall Financial Resource Strain (CARDIA)    Difficulty of Paying Living Expenses: Not very hard  Food Insecurity: No Food Insecurity (09/14/2022)   Hunger Vital Sign    Worried About Running Out of Food in the Last  Year: Never true    Ran Out of Food in the Last Year: Never true  Transportation Needs: No Transportation Needs (09/14/2022)   PRAPARE - Administrator, Civil Service (Medical): No    Lack of Transportation (Non-Medical): No  Physical Activity: Inactive (09/14/2022)   Exercise Vital Sign    Days of Exercise per Week: 0 days    Minutes of Exercise per Session: 0 min  Stress: No Stress Concern Present (09/14/2022)   [employer] of Occupational Health - Occupational Stress Questionnaire    Feeling of Stress : Only a little  Social Connections: Socially Integrated (09/14/2022)   Social Connection and Isolation Panel [NHANES]    Frequency of Communication with Friends and Family: More than three times a week    Frequency of Social Gatherings with Friends and Family: More than three times a week    Attends Religious Services: More than 4 times per year    Active Member of [employer] or Organizations: Yes    Attends Engineer, structural: More than 4 times per year    Marital Status: Married  Catering manager Violence: Not At Risk (09/14/2022)   Humiliation, Afraid, Rape, and Kick questionnaire    Fear of Current or Ex-Partner: No    Emotionally Abused: No    Physically Abused: No    Sexually Abused: No    Review of Systems  Constitutional:  Negative for chills, diaphoresis, fever, malaise/fatigue and weight loss.  HENT:  Negative for congestion, hearing loss, nosebleeds, sore throat and tinnitus.   Eyes:  Negative for blurred vision, photophobia and redness.  Respiratory:  Negative for cough, hemoptysis, sputum production, shortness of breath, wheezing and stridor.   Cardiovascular:  Negative for chest pain, palpitations, orthopnea, claudication, leg swelling and PND.  Gastrointestinal:  Negative for abdominal pain, blood in stool, constipation, diarrhea, heartburn, nausea and vomiting.  Genitourinary:  Negative for dysuria, flank pain, frequency, hematuria and urgency.   Musculoskeletal:  Negative for back pain, falls, joint pain, myalgias and neck pain.  Skin:  Negative for itching and rash.  Neurological:  Negative for dizziness, tingling, tremors, sensory change, speech change, focal weakness, seizures, loss of consciousness, weakness and headaches.  Endo/Heme/Allergies:  Negative for environmental allergies and polydipsia. Does not bruise/bleed easily.  Psychiatric/Behavioral:  Negative for depression, memory loss, substance abuse and suicidal ideas. The patient is not nervous/anxious and does not have insomnia.         Objective    BP (!) 147/82 (BP Location: Left Arm, Patient Position: Sitting, Cuff Size: Normal)   Pulse 76   Wt 186 lb 12.8 oz (84.7 kg)   SpO2 98%   BMI 37.73 kg/m   Physical Exam Vitals reviewed.  Constitutional:      Appearance: Normal appearance. He is well-developed. He is obese. He is not diaphoretic.  HENT:     Head: Normocephalic and atraumatic.     Nose: No nasal deformity, septal deviation, mucosal edema or rhinorrhea.     Right Sinus: No maxillary sinus tenderness or frontal sinus tenderness.     Left Sinus: No maxillary sinus tenderness or frontal sinus tenderness.     Mouth/Throat:     Pharynx: No oropharyngeal exudate.     Comments: Poor dentition Eyes:     General: No scleral icterus.    Conjunctiva/sclera: Conjunctivae normal.     Pupils: Pupils are equal, round, and reactive to light.  Neck:     Thyroid: No thyromegaly.     Vascular: No carotid bruit or JVD.     Trachea: Trachea normal. No tracheal tenderness or tracheal deviation.  Cardiovascular:     Rate and Rhythm: Normal rate and regular rhythm.     Chest Wall: PMI is not displaced.     Pulses: No decreased pulses.     Heart sounds: Normal heart sounds, S1 normal and S2 normal. Heart sounds not distant. No murmur heard.  No systolic murmur is present.     No diastolic murmur is present.     No friction rub. No gallop. No S3 or S4 sounds.      Comments: Decrease DP PT pulses bilaterally in both feet Pulmonary:     Effort: No tachypnea, accessory muscle usage or respiratory distress.     Breath sounds: No stridor. No decreased breath sounds, wheezing, rhonchi or rales.  Chest:     Chest wall: No tenderness.  Abdominal:     General: Bowel sounds are normal. There is no distension.     Palpations: Abdomen is soft. Abdomen is not rigid.     Tenderness: There is no abdominal tenderness. There is no guarding or rebound.  Musculoskeletal:        General: Normal range of motion.     Cervical back: Normal range of motion and neck supple. No edema, erythema or rigidity. No muscular tenderness. Normal range of motion.  Lymphadenopathy:     Head:     Right side of head: No submental or submandibular adenopathy.     Left side of head: No submental or submandibular adenopathy.     Cervical: No cervical adenopathy.  Skin:    General: Skin is warm and dry.     Coloration: Skin is not pale.     Findings: No rash.     Nails: There is no clubbing.  Neurological:     Mental Status: He is alert and oriented to person, place, and time.     Sensory: No sensory deficit.  Psychiatric:        Speech: Speech normal.        Behavior: Behavior normal.         Assessment & Plan:   Problem List Items Addressed This Visit       Cardiovascular and Mediastinum   Hypertension    Patient reports home BP 130-140s/unknown. Initially 147/82 in clinic but 138/70 when rechecked. Plan for patient to continue Amlodipine 10 mg PO daily, Carvedilol 12.5 mg PO twice daily, and hydrochlorothiazide 15 mg PO daily. He was encouraged to continue monitoring his BP at home.      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 Today's A1C 6.9, cbg. Plan to continue Trulicity  1.5 mg [place] weekly. Patient encouraged to check cbgs at home. Will also check CMET and urine albumin/creatinine in setting of stage 3B chronic kidney disease.      Relevant Medications   atorvastatin (LIPITOR) 40 MG tablet   Other Relevant Orders   Urine microalbumin-creatinine with uACR   Comprehensive metabolic panel with eGFR (no eGFR if sent to [facility])     Other   Dyslipidemia    Plan to continue statin for hyperlipidemia. Changes to medication may be made based on today's labs.      Relevant Medications   atorvastatin (LIPITOR) 40 MG tablet   Gout   Other Visit Diagnoses     Need for immunization against influenza       Relevant Orders   Flu vaccine trivalent PF, 6mos and older(Flulaval,Afluria,Fluarix,Fluzone) (Completed)      30 min spent assessing patient reviewing records complex decision making high complicated by language barrier Return in about 4 months (around 07/11/2023) for htn, diabetes.  Shan Levans, MD

## 2023-04-20 ENCOUNTER — Other Ambulatory Visit: Payer: Self-pay

## 2023-04-22 ENCOUNTER — Other Ambulatory Visit: Payer: Self-pay

## 2023-05-21 ENCOUNTER — Other Ambulatory Visit: Payer: Self-pay

## 2023-06-14 ENCOUNTER — Other Ambulatory Visit: Payer: Self-pay

## 2023-07-26 ENCOUNTER — Other Ambulatory Visit: Payer: Self-pay

## 2023-07-26 ENCOUNTER — Other Ambulatory Visit: Payer: Self-pay | Admitting: Critical Care Medicine

## 2023-07-26 MED ORDER — SODIUM BICARBONATE 650 MG PO TABS
650.0000 mg | ORAL_TABLET | Freq: Two times a day (BID) | ORAL | 0 refills | Status: DC
  Filled 2023-07-26: qty 60, 30d supply, fill #0

## 2023-07-26 NOTE — Telephone Encounter (Signed)
Requested medication (s) are due for refill today: yes   Requested medication (s) are on the active medication list: yes  Last refill:  12/09/22 #180 1 refills  Future visit scheduled: no   Notes to clinic:  last labs 12/09/22. Last ordered by Dr. Delford Field. Do you want to refill Rx?     Requested Prescriptions  Pending Prescriptions Disp Refills   sodium bicarbonate 650 MG tablet 180 tablet 1    Sig: Take 1 tablet (650 mg total) by mouth 2 (two) times daily.     Endocrinology:  Minerals 2 Failed - 07/26/2023  4:17 PM      Failed - Mg Level in normal range and within 180 days    Magnesium  Date Value Ref Range Status  01/18/2022 1.9 1.7 - 2.4 mg/dL Final    Comment:    Performed at [facility], [address], [place], [place] [address]         Failed - BMP within normal limits in the last 6 months    Glucose  Date Value Ref Range Status  12/09/2022 177 (H) 70 - 99 mg/dL Final   Glucose, Bld  Date Value Ref Range Status  02/01/2022 131 (H) 70 - 99 mg/dL Final    Comment:    Glucose reference range applies only to samples taken after fasting for at least 8 hours.   POC Glucose  Date Value Ref Range Status  08/04/2022 335 (A) 70 - 99 mg/dl Final   Glucose-Capillary  Date Value Ref Range Status  02/01/2022 172 (H) 70 - 99 mg/dL Final    Comment:    Glucose reference range applies only to samples taken after fasting for at least 8 hours.   Calcium  Date Value Ref Range Status  12/09/2022 8.4 (L) 8.7 - 10.2 mg/dL Final   Calcium, Ion  Date Value Ref Range Status  01/20/2022 1.04 (L) 1.15 - 1.40 mmol/L Final   Sodium  Date Value Ref Range Status  12/09/2022 140 134 - 144 mmol/L Final   Potassium  Date Value Ref Range Status  12/09/2022 4.3 3.5 - 5.2 mmol/L Final   Chloride  Date Value Ref Range Status  12/09/2022 105 96 - 106 mmol/L Final   BUN  Date Value Ref Range Status  12/09/2022 40 (H) 6 - 24 mg/dL Final   Creat  Date Value Ref Range Status   08/16/2020 1.18 0.70 - 1.33 mg/dL Final    Comment:    For patients >89 years of age, the reference limit for Creatinine is approximately 13% higher for people identified as African-American. .    Creatinine, Ser  Date Value Ref Range Status  12/09/2022 1.76 (H) 0.76 - 1.27 mg/dL Final   Creatinine, Urine  Date Value Ref Range Status  04/14/2020 89.55 mg/dL Final   CO2  Date Value Ref Range Status  12/09/2022 21 20 - 29 mmol/L Final   Bicarbonate  Date Value Ref Range Status  01/20/2022 25.9 20.0 - 28.0 mmol/L Final   TCO2  Date Value Ref Range Status  01/20/2022 27 22 - 32 mmol/L Final   GFR calc Af Amer  Date Value Ref Range Status  09/21/2009  >60 mL/min Final   >60        The eGFR has been calculated using the MDRD equation. This calculation has not been validated in all clinical situations. eGFR's persistently <60 mL/min signify possible Chronic Kidney Disease.   eGFR  Date Value Ref Range Status  12/09/2022 45 (L) >59 mL/min/1.73 Final   GFR, Estimated  Date Value Ref Range Status  02/01/2022 34 (L) >60 mL/min Final    Comment:    (NOTE) Calculated using the CKD-EPI Creatinine Equation (2021)          Passed - Valid encounter within last 12 months    Recent Outpatient Visits           4 months ago Type 2 diabetes mellitus with stage 3b chronic kidney disease, without long-term current use of insulin (HCC)   [facility] [place] Storm Frisk, MD   7 months ago Type 2 diabetes mellitus with stage 3b chronic kidney disease, without long-term current use of insulin (HCC)   [facility] [place] Storm Frisk, MD   9 months ago Primary hypertension   [facility] [place] Storm Frisk, MD   10 months ago Type 2 diabetes mellitus with stage 3b chronic kidney disease, without  long-term current use of insulin (HCC)   [facility] [place]. [facility] Lois Huxley, Barnes L, RPH-CPP   11 months ago Type 2 diabetes mellitus with stage 3b chronic kidney disease, without long-term current use of insulin (HCC)   [facility] Storm Frisk, MD

## 2023-08-04 ENCOUNTER — Other Ambulatory Visit: Payer: Self-pay

## 2023-08-23 ENCOUNTER — Other Ambulatory Visit: Payer: Self-pay

## 2023-09-23 ENCOUNTER — Other Ambulatory Visit: Payer: Self-pay | Admitting: Critical Care Medicine

## 2023-09-23 ENCOUNTER — Other Ambulatory Visit: Payer: Self-pay

## 2023-09-24 ENCOUNTER — Other Ambulatory Visit: Payer: Self-pay

## 2023-09-24 MED ORDER — TRULICITY 1.5 MG/0.5ML ~~LOC~~ SOAJ
1.5000 mg | SUBCUTANEOUS | 0 refills | Status: DC
  Filled 2023-09-24: qty 2, 28d supply, fill #0

## 2023-09-24 NOTE — Telephone Encounter (Signed)
 Requested medication (s) are due for refill today: Yes  Requested medication (s) are on the active medication list: Yes  Last refill:  02/15/23  Future visit scheduled: No   Notes to clinic:  Unable to refill per protocol, appointment needed. (Requesting dose increase)     Requested Prescriptions  Pending Prescriptions Disp Refills   Dulaglutide (TRULICITY) 1.5 MG/0.5ML SOAJ 6 mL 1    Sig: Inject 1.5 mg into the skin once a week.     Endocrinology:  Diabetes - GLP-1 Receptor Agonists Failed - 09/24/2023 10:59 AM      Failed - HBA1C is between 0 and 7.9 and within 180 days    HbA1c, POC (controlled diabetic range)  Date Value Ref Range Status  02/15/2023 6.9 0.0 - 7.0 % Final         Failed - Valid encounter within last 6 months    Recent Outpatient Visits           6 months ago Type 2 diabetes mellitus with stage 3b chronic kidney disease, without long-term current use of insulin (HCC)   [facility] [place] Storm Frisk, MD   9 months ago Type 2 diabetes mellitus with stage 3b chronic kidney disease, without long-term current use of insulin (HCC)   [facility] [place] Storm Frisk, MD   11 months ago Primary hypertension   [facility] [place] Storm Frisk, MD   1 year ago Type 2 diabetes mellitus with stage 3b chronic kidney disease, without long-term current use of insulin (HCC)   [facility] [place]. [facility] Yehuda Savannah L, RPH-CPP   1 year ago Type 2 diabetes mellitus with stage 3b chronic kidney disease, without long-term current use of insulin (HCC)   [facility] [place]. [facility] Storm Frisk, MD

## 2023-09-27 ENCOUNTER — Other Ambulatory Visit: Payer: Self-pay

## 2023-10-12 ENCOUNTER — Ambulatory Visit: Payer: Self-pay | Admitting: Physician Assistant

## 2023-10-12 ENCOUNTER — Encounter: Payer: Self-pay | Admitting: Physician Assistant

## 2023-10-12 ENCOUNTER — Other Ambulatory Visit: Payer: Self-pay

## 2023-10-12 VITALS — BP 153/73 | HR 83 | Ht 62.0 in | Wt 211.0 lb

## 2023-10-12 DIAGNOSIS — E1122 Type 2 diabetes mellitus with diabetic chronic kidney disease: Secondary | ICD-10-CM

## 2023-10-12 DIAGNOSIS — R739 Hyperglycemia, unspecified: Secondary | ICD-10-CM

## 2023-10-12 DIAGNOSIS — N1832 Chronic kidney disease, stage 3b: Secondary | ICD-10-CM

## 2023-10-12 DIAGNOSIS — I739 Peripheral vascular disease, unspecified: Secondary | ICD-10-CM

## 2023-10-12 DIAGNOSIS — D649 Anemia, unspecified: Secondary | ICD-10-CM

## 2023-10-12 DIAGNOSIS — E1142 Type 2 diabetes mellitus with diabetic polyneuropathy: Secondary | ICD-10-CM

## 2023-10-12 DIAGNOSIS — J302 Other seasonal allergic rhinitis: Secondary | ICD-10-CM

## 2023-10-12 DIAGNOSIS — I10 Essential (primary) hypertension: Secondary | ICD-10-CM

## 2023-10-12 DIAGNOSIS — E113553 Type 2 diabetes mellitus with stable proliferative diabetic retinopathy, bilateral: Secondary | ICD-10-CM

## 2023-10-12 DIAGNOSIS — Z7985 Long-term (current) use of injectable non-insulin antidiabetic drugs: Secondary | ICD-10-CM

## 2023-10-12 DIAGNOSIS — Z125 Encounter for screening for malignant neoplasm of prostate: Secondary | ICD-10-CM

## 2023-10-12 DIAGNOSIS — E785 Hyperlipidemia, unspecified: Secondary | ICD-10-CM

## 2023-10-12 DIAGNOSIS — J3089 Other allergic rhinitis: Secondary | ICD-10-CM

## 2023-10-12 DIAGNOSIS — M1A9XX0 Chronic gout, unspecified, without tophus (tophi): Secondary | ICD-10-CM

## 2023-10-12 DIAGNOSIS — R748 Abnormal levels of other serum enzymes: Secondary | ICD-10-CM

## 2023-10-12 LAB — POCT GLYCOSYLATED HEMOGLOBIN (HGB A1C): Hemoglobin A1C: 6.2 % — AB (ref 4.0–5.6)

## 2023-10-12 MED ORDER — AMLODIPINE BESYLATE 10 MG PO TABS
10.0000 mg | ORAL_TABLET | Freq: Every day | ORAL | 2 refills | Status: DC
  Filled 2023-10-12: qty 90, 90d supply, fill #0
  Filled 2024-01-14: qty 90, 90d supply, fill #1
  Filled 2024-05-05: qty 90, 90d supply, fill #2

## 2023-10-12 MED ORDER — HYDROCHLOROTHIAZIDE 25 MG PO TABS
25.0000 mg | ORAL_TABLET | Freq: Every day | ORAL | 2 refills | Status: DC
  Filled 2023-10-12: qty 90, 90d supply, fill #0

## 2023-10-12 MED ORDER — TRULICITY 1.5 MG/0.5ML ~~LOC~~ SOAJ
1.5000 mg | SUBCUTANEOUS | 0 refills | Status: DC
  Filled 2023-10-12 – 2023-10-25 (×2): qty 2, 28d supply, fill #0

## 2023-10-12 MED ORDER — SODIUM BICARBONATE 650 MG PO TABS
650.0000 mg | ORAL_TABLET | Freq: Two times a day (BID) | ORAL | 0 refills | Status: DC
  Filled 2023-10-12: qty 60, 30d supply, fill #0

## 2023-10-12 MED ORDER — CETIRIZINE HCL 10 MG PO TABS
10.0000 mg | ORAL_TABLET | Freq: Every day | ORAL | 11 refills | Status: AC
  Filled 2023-10-12: qty 30, 30d supply, fill #0
  Filled 2024-05-05: qty 30, 30d supply, fill #1
  Filled 2024-07-28: qty 30, 30d supply, fill #2

## 2023-10-12 MED ORDER — CARVEDILOL 12.5 MG PO TABS
12.5000 mg | ORAL_TABLET | Freq: Two times a day (BID) | ORAL | 3 refills | Status: AC
  Filled 2023-10-12: qty 120, 60d supply, fill #0
  Filled 2024-01-14: qty 120, 60d supply, fill #1
  Filled 2024-05-05: qty 120, 60d supply, fill #2
  Filled 2024-07-28: qty 120, 60d supply, fill #3

## 2023-10-12 NOTE — Progress Notes (Signed)
 Established Patient Office Visit  Subjective   Patient ID: Keith Garrett, male    DOB: August 09, 1965  Age: 58 y.o. MRN: [id number]  Chief Complaint  Patient presents with   Medication Refill   Sore Throat    Proximately 5 days ago. Path as been experiencing headache and vomiting    Discussed the use of AI scribe software for clinical note transcription with the patient, who gave verbal consent to proceed.  History of Present Illness   The patient, with a history of hypertension, hyperlipidemia, diabetes, and gout, presents for medication refills after being out of most of his medications for approximately three weeks.   In addition to the need for medication refills, the patient has been experiencing a sore throat for the past five days. The sore throat is intermittent and not associated with vomiting. He has been self-treating with a diet of cucumbers and vegetables, which has provided some relief. He denies being around anyone who is sick and does not report any fever, but does note occasional chills.  The patient also reports vomiting for the past three days, describing the vomitus as 'just white stuff.' He denies any association with meals.  He also mentions some discomfort in his back, which has been present for about five days. He describes it as a feeling of tiredness in the area.   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   Traumatic rupture of plantar fascia of left foot 04/14/2020   Social History   Socioeconomic History   Marital status: Married    Spouse name: Not on file   Number of children: Not on file   Years of  education: Not on file   Highest education level: Not on file  Occupational History   Not on file  Tobacco Use   Smoking status: Never   Smokeless tobacco: Never  Vaping Use   Vaping status: Never Used  Substance and Sexual Activity   Alcohol use: Not Currently    Comment: occasionally   Drug use: No   Sexual activity: Not Currently  Other Topics Concern   Not on file  Social History Narrative   Not on file   Social Drivers of Health   Financial Resource Strain: Low Risk  (09/14/2022)   Overall Financial Resource Strain (CARDIA)    Difficulty of Paying Living Expenses: Not very hard  Food Insecurity: No Food Insecurity (09/14/2022)   Hunger Vital Sign    Worried About Running Out of Food in the Last Year: Never true    Ran Out of Food in the Last Year: Never true  Transportation Needs: No Transportation Needs (09/14/2022)   PRAPARE - Administrator, Civil Service (Medical): No    Lack of Transportation (Non-Medical): No  Physical Activity: Inactive (09/14/2022)   Exercise Vital Sign    Days of Exercise per Week: 0 days    Minutes of Exercise per Session: 0 min  Stress: No Stress Concern Present (09/14/2022)   [employer] of Occupational Health - Occupational Stress Questionnaire    Feeling of Stress : Only a little  Social Connections: Socially Integrated (09/14/2022)  Social Advertising account executive [NHANES]    Frequency of Communication with Friends and Family: More than three times a week    Frequency of Social Gatherings with Friends and Family: More than three times a week    Attends Religious Services: More than 4 times per year    Active Member of [employer] or Organizations: Yes    Attends Engineer, structural: More than 4 times per year    Marital Status: Married  Catering manager Violence: Not At Risk (09/14/2022)   Humiliation, Afraid, Rape, and Kick questionnaire    Fear of Current or Ex-Partner: No    Emotionally Abused: No     Physically Abused: No    Sexually Abused: No   Family History  Problem Relation Age of Onset   Diabetes Mother    No Known Allergies  Review of Systems  Constitutional:  Negative for chills and fever.  HENT:  Positive for sore throat. Negative for congestion.   Eyes: Negative.   Respiratory:  Negative for cough and shortness of breath.   Cardiovascular:  Negative for chest pain.  Gastrointestinal:  Positive for vomiting. Negative for abdominal pain, diarrhea, heartburn and nausea.  Genitourinary: Negative.   Musculoskeletal:  Positive for myalgias.  Skin: Negative.   Neurological: Negative.   Endo/Heme/Allergies: Negative.   Psychiatric/Behavioral: Negative.        Objective:     BP (!) 153/73 (BP Location: Left Arm, Patient Position: Sitting, Cuff Size: Normal)   Pulse 83   Ht 5\' 2"  (1.575 m)   Wt 211 lb (95.7 kg)   SpO2 94%   BMI 38.59 kg/m  BP Readings from Last 3 Encounters:  10/12/23 (!) 153/73  03/11/23 (!) 147/82  02/15/23 123/78   Wt Readings from Last 3 Encounters:  10/12/23 211 lb (95.7 kg)  03/11/23 186 lb 12.8 oz (84.7 kg)  12/09/22 190 lb 3.2 oz (86.3 kg)    Physical Exam Vitals and nursing note reviewed.    GENERAL: Alert, cooperative, well developed, no acute distress HEENT: Normocephalic, normal oropharynx, moist mucous membranes, throat slightly erythematous, no exudate NECK: No abnormalities CHEST: Clear to auscultation bilaterally, no wheezes, rhonchi, or crackles CARDIOVASCULAR: Normal heart rate and rhythm, S1 and S2 normal without murmurs ABDOMEN: Soft, non-tender, non-distended, without organomegaly, normal bowel sounds EXTREMITIES: No cyanosis or edema NEUROLOGICAL: Cranial nerves grossly intact, moves all extremities without gross motor or sensory deficit Results for orders placed or performed in visit on 10/12/23  POCT glycosylated hemoglobin (Hb A1C)  Result Value Ref Range   Hemoglobin A1C 6.2 (A) 4.0 - 5.6 %   HbA1c POC (<>  result, manual entry)     HbA1c, POC (prediabetic range)     HbA1c, POC (controlled diabetic range)       Assessment & Plan:   Problem List Items Addressed This Visit       Cardiovascular and Mediastinum   PVD (peripheral vascular disease) (HCC)   Relevant Medications   amLODipine  (NORVASC ) 10 MG tablet   carvedilol  (COREG ) 12.5 MG tablet   hydrochlorothiazide  (HYDRODIURIL ) 25 MG tablet   Hypertension   Relevant Medications   amLODipine  (NORVASC ) 10 MG tablet   carvedilol  (COREG ) 12.5 MG tablet   hydrochlorothiazide  (HYDRODIURIL ) 25 MG tablet   sodium bicarbonate  650 MG tablet   Other Relevant Orders   TSH     Endocrine   Diabetic polyneuropathy associated with type 2 diabetes mellitus (HCC)   Relevant Medications   Dulaglutide  (TRULICITY ) 1.5 MG/0.5ML SOAJ  Diabetic retinopathy (HCC)   Relevant Medications   Dulaglutide  (TRULICITY ) 1.5 MG/0.5ML SOAJ   Type II diabetes mellitus with stage 3 chronic kidney disease (HCC)   Relevant Medications   Dulaglutide  (TRULICITY ) 1.5 MG/0.5ML SOAJ   Other Relevant Orders   CBC with Differential/Platelet   Comp. Metabolic Panel (12)   Microalbumin / creatinine urine ratio     Other   Normocytic anemia   Dyslipidemia   Gout   Relevant Orders   Uric Acid   Other Visit Diagnoses       Elevated random blood glucose level    -  Primary   Relevant Orders   POCT glycosylated hemoglobin (Hb A1C) (Completed)     Screening PSA (prostate specific antigen)       Relevant Orders   PSA     Seasonal allergies       Relevant Medications   cetirizine  (ZYRTEC  ALLERGY) 10 MG tablet      1. Type 2 diabetes mellitus with stage 3b chronic kidney disease, without long-term current use of insulin  (HCC) (Primary) A1C 6.2 Continue current regimen - POCT glycosylated hemoglobin (Hb A1C) - CBC with Differential/Platelet - Comp. Metabolic Panel (12) - Microalbumin / creatinine urine ratio - Dulaglutide  (TRULICITY ) 1.5 MG/0.5ML SOAJ; Inject  1.5 mg into the skin once a week.  Dispense: 2 mL; Refill: 0  2. Seasonal allergic rhinitis due to other allergic trigger Trial zyrtec , patient education given on supportive care .   Intermittent sore throat for the past five days, possibly related to allergies. Intermittent vomiting of white substance for the past three days, possibly related to allergies. - cetirizine  (ZYRTEC  ALLERGY) 10 MG tablet; Take 1 tablet (10 mg total) by mouth daily.  Dispense: 30 tablet; Refill: 11  3. Primary hypertension Continue - TSH - amLODipine  (NORVASC ) 10 MG tablet; Take 1 tablet (10 mg total) by mouth daily.  Dispense: 90 tablet; Refill: 2 - carvedilol  (COREG ) 12.5 MG tablet; Take 1 tablet (12.5 mg total) by mouth 2 (two) times daily with a meal.  Dispense: 120 tablet; Refill: 3 - hydrochlorothiazide  (HYDRODIURIL ) 25 MG tablet; Take 1 tablet (25 mg total) by mouth daily.  Dispense: 90 tablet; Refill: 2 - sodium bicarbonate  650 MG tablet; Take 1 tablet (650 mg total) by mouth 2 (two) times daily.  Dispense: 60 tablet; Refill: 0  4. Chronic gout without tophus, unspecified cause, unspecified site Ongoing management with allopurinol . He reports having some medication available at home. - Uric Acid  5. Screening PSA (prostate specific antigen) - PSA   I have reviewed the patient's medical history (PMH, PSH, Social History, Family History, Medications, and allergies) , and have been updated if relevant. I spent 30 minutes reviewing chart and  face to face time with patient.   Return if symptoms worsen or fail to improve.    Etter Hermann Mayers, PA-C

## 2023-10-12 NOTE — Patient Instructions (Addendum)
 VISIT SUMMARY:  You came in today for medication refills and reported feeling unwell. You mentioned having a sore throat for the past five days and intermittent vomiting for the past three days.You have been out of your medications for 2 weeks.  YOUR PLAN:  -HYPERTENSION: Hypertension means high blood pressure. Your blood pressure management was interrupted due to running out of medications. We have refilled your prescriptions for amlodipine  and carvedilol .  -TYPE 2 DIABETES MELLITUS: Type 2 diabetes is a condition where your blood sugar levels are too high. Your diabetes management was interrupted due to running out of medications. We have refilled your prescription for Trulicity  and ensured you have blood glucose testing strips available.  -HYPERLIPIDEMIA: Hyperlipidemia means high cholesterol levels. Your cholesterol management was interrupted due to running out of medications. We have refilled your prescription for atorvastatin .  -SORE THROAT: You have had a sore throat for the past five days, which may be related to allergies. We recommend starting Zyrtec  once daily and monitoring your symptoms. Please report if there is no improvement in a few days.  -VOMITING: You have been experiencing intermittent vomiting of a white substance for the past three days, which may be related to allergies or other underlying issues.  Sore Throat A sore throat is pain, burning, irritation, or scratchiness in the throat. When you have a sore throat, you may feel pain or tenderness in your throat when you swallow or talk. Many things can cause a sore throat, including: An infection. Seasonal allergies. Dryness in the air. Irritants, such as smoke or pollution. Radiation treatment for cancer. Gastroesophageal reflux disease (GERD). A tumor. A sore throat is often the first sign of another sickness. It may happen with other symptoms, such as coughing, sneezing, fever, and swollen neck glands. Most sore  throats go away without medical treatment. Follow these instructions at home:     Medicines Take over-the-counter and prescription medicines only as told by your health care provider. Children often get sore throats. Do not give your child aspirin  because of the association with Reye's syndrome. Use throat sprays to soothe your throat as told by your health care provider. Managing pain To help with pain, try: Sipping warm liquids, such as broth, herbal tea, or warm water . Eating or drinking cold or frozen liquids, such as frozen ice pops. Gargling with a mixture of salt and water  3-4 times a day or as needed. To make salt water , completely dissolve -1 tsp (3-6 g) of salt in 1 cup (237 mL) of warm water . Sucking on hard candy or throat lozenges. Putting a cool-mist humidifier in your bedroom at night to moisten the air. Sitting in the bathroom with the door closed for 5-10 minutes while you run hot water  in the shower. General instructions Do not use any products that contain nicotine or tobacco. These products include cigarettes, chewing tobacco, and vaping devices, such as e-cigarettes. If you need help quitting, ask your health care provider. Rest as needed. Drink enough fluid to keep your urine pale yellow. Wash your hands often with soap and water  for at least 20 seconds. If soap and water  are not available, use hand sanitizer. Contact a health care provider if: You have a fever for more than 2-3 days. You have symptoms that last for more than 2-3 days. Your throat does not get better within 7 days. You have a fever and your symptoms suddenly get worse. Get help right away if: You have difficulty breathing. You cannot swallow fluids, soft  foods, or your saliva. You have increased swelling in your throat or neck. You have persistent nausea and vomiting. These symptoms may represent a serious problem that is an emergency. Do not wait to see if the symptoms will go away. Get medical  help right away. Call your local emergency services (911 in the [place] Do not drive yourself to the hospital. Summary A sore throat is pain, burning, irritation, or scratchiness in the throat. Many things can cause a sore throat. Take over-the-counter medicines only as told by your health care provider. Rest as needed. Drink enough fluid to keep your urine pale yellow. Contact a health care provider if your throat does not get better within 7 days. This information is not intended to replace advice given to you by your health care provider. Make sure you discuss any questions you have with your health care provider. Document Revised: 09/04/2020 Document Reviewed: 09/04/2020 Elsevier Patient Education  2024 [employer].

## 2023-10-15 LAB — MICROALBUMIN / CREATININE URINE RATIO
Creatinine, Urine: 188.9 mg/dL
Microalb/Creat Ratio: 391 mg/g{creat} — ABNORMAL HIGH (ref 0–29)
Microalbumin, Urine: 738.2 ug/mL

## 2023-10-15 LAB — CBC WITH DIFFERENTIAL/PLATELET
Basophils Absolute: 0.1 10*3/uL (ref 0.0–0.2)
Basos: 1 %
EOS (ABSOLUTE): 0.4 10*3/uL (ref 0.0–0.4)
Eos: 5 %
Hematocrit: 30.8 % — ABNORMAL LOW (ref 37.5–51.0)
Hemoglobin: 9.5 g/dL — ABNORMAL LOW (ref 13.0–17.7)
Immature Grans (Abs): 0 10*3/uL (ref 0.0–0.1)
Immature Granulocytes: 0 %
Lymphocytes Absolute: 1 10*3/uL (ref 0.7–3.1)
Lymphs: 13 %
MCH: 29.4 pg (ref 26.6–33.0)
MCHC: 30.8 g/dL — ABNORMAL LOW (ref 31.5–35.7)
MCV: 95 fL (ref 79–97)
Monocytes Absolute: 0.8 10*3/uL (ref 0.1–0.9)
Monocytes: 10 %
Neutrophils Absolute: 5.5 10*3/uL (ref 1.4–7.0)
Neutrophils: 71 %
Platelets: 298 10*3/uL (ref 150–450)
RBC: 3.23 x10E6/uL — ABNORMAL LOW (ref 4.14–5.80)
RDW: 14.5 % (ref 11.6–15.4)
WBC: 7.7 10*3/uL (ref 3.4–10.8)

## 2023-10-15 LAB — URIC ACID: Uric Acid: 7.3 mg/dL (ref 3.8–8.4)

## 2023-10-15 LAB — COMP. METABOLIC PANEL (12)
AST: 19 IU/L (ref 0–40)
Albumin: 4.1 g/dL (ref 3.8–4.9)
Alkaline Phosphatase: 172 IU/L — ABNORMAL HIGH (ref 44–121)
BUN/Creatinine Ratio: 16 (ref 9–20)
BUN: 29 mg/dL — ABNORMAL HIGH (ref 6–24)
Bilirubin Total: 0.3 mg/dL (ref 0.0–1.2)
Calcium: 8.5 mg/dL — ABNORMAL LOW (ref 8.7–10.2)
Chloride: 103 mmol/L (ref 96–106)
Creatinine, Ser: 1.84 mg/dL — ABNORMAL HIGH (ref 0.76–1.27)
Globulin, Total: 3.1 g/dL (ref 1.5–4.5)
Glucose: 104 mg/dL — ABNORMAL HIGH (ref 70–99)
Potassium: 3.9 mmol/L (ref 3.5–5.2)
Sodium: 140 mmol/L (ref 134–144)
Total Protein: 7.2 g/dL (ref 6.0–8.5)
eGFR: 42 mL/min/{1.73_m2} — ABNORMAL LOW (ref >59)

## 2023-10-15 LAB — SPECIMEN STATUS REPORT

## 2023-10-15 LAB — PSA: Prostate Specific Ag, Serum: 0.5 ng/mL (ref 0.0–4.0)

## 2023-10-15 LAB — TSH: TSH: 3.19 u[IU]/mL (ref 0.450–4.500)

## 2023-10-18 ENCOUNTER — Other Ambulatory Visit: Payer: Self-pay

## 2023-10-18 MED ORDER — ALLOPURINOL 100 MG PO TABS
150.0000 mg | ORAL_TABLET | Freq: Every day | ORAL | 1 refills | Status: DC
  Filled 2023-10-18: qty 45, 30d supply, fill #0
  Filled 2024-01-14: qty 45, 30d supply, fill #1

## 2023-10-18 MED ORDER — LISINOPRIL 5 MG PO TABS
5.0000 mg | ORAL_TABLET | Freq: Every day | ORAL | 1 refills | Status: DC
  Filled 2023-10-18: qty 30, 30d supply, fill #0

## 2023-10-18 NOTE — Addendum Note (Signed)
 Addended by: Malcom Scriver on: 10/18/2023 11:05 AM   Modules accepted: Orders

## 2023-10-25 ENCOUNTER — Other Ambulatory Visit: Payer: Self-pay

## 2023-10-27 ENCOUNTER — Ambulatory Visit: Payer: Self-pay | Admitting: Nurse Practitioner

## 2023-11-02 ENCOUNTER — Emergency Department (HOSPITAL_COMMUNITY)
Admission: EM | Admit: 2023-11-02 | Discharge: 2023-11-02 | Disposition: A | Discharge status: Home / Self Care | Payer: Self-pay | Attending: Emergency Medicine | Admitting: Emergency Medicine

## 2023-11-02 ENCOUNTER — Other Ambulatory Visit: Payer: Self-pay

## 2023-11-02 ENCOUNTER — Encounter (HOSPITAL_COMMUNITY): Payer: Self-pay | Admitting: Pharmacy Technician

## 2023-11-02 ENCOUNTER — Emergency Department (HOSPITAL_COMMUNITY): Payer: Self-pay

## 2023-11-02 DIAGNOSIS — E119 Type 2 diabetes mellitus without complications: Secondary | ICD-10-CM | POA: Insufficient documentation

## 2023-11-02 DIAGNOSIS — Z79899 Other long term (current) drug therapy: Secondary | ICD-10-CM | POA: Insufficient documentation

## 2023-11-02 DIAGNOSIS — I10 Essential (primary) hypertension: Secondary | ICD-10-CM | POA: Insufficient documentation

## 2023-11-02 DIAGNOSIS — Z7982 Long term (current) use of aspirin: Secondary | ICD-10-CM | POA: Insufficient documentation

## 2023-11-02 DIAGNOSIS — R600 Localized edema: Secondary | ICD-10-CM | POA: Insufficient documentation

## 2023-11-02 LAB — CBC
HCT: 26.6 % — ABNORMAL LOW (ref 39.0–52.0)
Hemoglobin: 8.6 g/dL — ABNORMAL LOW (ref 13.0–17.0)
MCH: 29.5 pg (ref 26.0–34.0)
MCHC: 32.3 g/dL (ref 30.0–36.0)
MCV: 91.1 fL (ref 80.0–100.0)
Platelets: 214 10*3/uL (ref 150–400)
RBC: 2.92 MIL/uL — ABNORMAL LOW (ref 4.22–5.81)
RDW: 15 % (ref 11.5–15.5)
WBC: 7.2 10*3/uL (ref 4.0–10.5)
nRBC: 0 % (ref 0.0–0.2)

## 2023-11-02 LAB — URINALYSIS, ROUTINE W REFLEX MICROSCOPIC
Bilirubin Urine: NEGATIVE
Glucose, UA: NEGATIVE mg/dL
Hgb urine dipstick: NEGATIVE
Ketones, ur: NEGATIVE mg/dL
Leukocytes,Ua: NEGATIVE
Nitrite: NEGATIVE
Protein, ur: 30 mg/dL — AB
Specific Gravity, Urine: 1.006 (ref 1.005–1.030)
pH: 7 (ref 5.0–8.0)

## 2023-11-02 LAB — BRAIN NATRIURETIC PEPTIDE: B Natriuretic Peptide: 383.8 pg/mL — ABNORMAL HIGH (ref 0.0–100.0)

## 2023-11-02 LAB — HEPATIC FUNCTION PANEL
ALT: 19 U/L (ref 0–44)
AST: 26 U/L (ref 15–41)
Albumin: 3 g/dL — ABNORMAL LOW (ref 3.5–5.0)
Alkaline Phosphatase: 134 U/L — ABNORMAL HIGH (ref 38–126)
Bilirubin, Direct: 0.2 mg/dL (ref 0.0–0.2)
Indirect Bilirubin: 0.3 mg/dL (ref 0.3–0.9)
Total Bilirubin: 0.5 mg/dL (ref 0.0–1.2)
Total Protein: 6.1 g/dL — ABNORMAL LOW (ref 6.5–8.1)

## 2023-11-02 LAB — BASIC METABOLIC PANEL WITH GFR
Anion gap: 4 — ABNORMAL LOW (ref 5–15)
BUN: 15 mg/dL (ref 6–20)
CO2: 21 mmol/L — ABNORMAL LOW (ref 22–32)
Calcium: 8.1 mg/dL — ABNORMAL LOW (ref 8.9–10.3)
Chloride: 113 mmol/L — ABNORMAL HIGH (ref 98–111)
Creatinine, Ser: 1.32 mg/dL — ABNORMAL HIGH (ref 0.61–1.24)
GFR, Estimated: >60 mL/min (ref >60)
Glucose, Bld: 106 mg/dL — ABNORMAL HIGH (ref 70–99)
Potassium: 3.8 mmol/L (ref 3.5–5.1)
Sodium: 138 mmol/L (ref 135–145)

## 2023-11-02 MED ORDER — FUROSEMIDE 40 MG PO TABS
20.0000 mg | ORAL_TABLET | Freq: Every day | ORAL | 0 refills | Status: DC
  Filled 2023-11-02: qty 15, 30d supply, fill #0

## 2023-11-02 MED ORDER — FUROSEMIDE 10 MG/ML IJ SOLN
40.0000 mg | Freq: Once | INTRAMUSCULAR | Status: AC
  Administered 2023-11-02: 40 mg via INTRAVENOUS
  Filled 2023-11-02: qty 4

## 2023-11-02 NOTE — ED Provider Notes (Signed)
 [facility] Provider Note   CSN: [id number] Arrival date & time: 11/02/23  0857     History  Chief Complaint  Patient presents with   Leg Swelling    Keith Garrett is a 58 y.o. male.  HPI Patient with diabetes, hypertension, now presents with substantial bilateral lower extremity edema.  He notes that he has been taking his medications as directed, saw clinic a few weeks ago, and is recently developed worsening bilateral symmetric edema now interfering with his ability to perform ADL.  No chest pain, no dyspnea, no obvious weight gain.  He has been taking his medication as directed.  He states that after his recent evaluation he was referred for outpatient ultrasound of his liver scheduled for next week.    Home Medications Prior to Admission medications   Medication Sig Start Date End Date Taking? Authorizing Provider  furosemide (LASIX) 40 MG tablet Take 0.5 tablets (20 mg total) by mouth daily. 11/02/23 12/02/23 Yes Dorenda Gandy, MD  allopurinol  (ZYLOPRIM ) 100 MG tablet Take 1.5 tablets (150 mg total) by mouth daily. 10/18/23   Mayers, Cari S, PA-C  amLODipine  (NORVASC ) 10 MG tablet Take 1 tablet (10 mg total) by mouth daily. 10/12/23   Mayers, Etter Hermann, PA-C  aspirin  EC 81 MG tablet Take 1 tablet (81 mg total) by mouth daily. Swallow whole. 02/05/22   Vernell Goldsmith, MD  atorvastatin  (LIPITOR) 40 MG tablet Take 1 tablet (40 mg total) by mouth daily. 03/11/23   Vernell Goldsmith, MD  Blood Glucose Monitoring Suppl (TRUE METRIX METER) w/Device KIT Use to measure blood sugar twice a day 02/15/23   Vernell Goldsmith, MD  Blood Pressure Monitoring (BLOOD PRESSURE KIT) DEVI Use to measure blood pressure 03/31/22   Vernell Goldsmith, MD  carvedilol  (COREG ) 12.5 MG tablet Take 1 tablet (12.5 mg total) by mouth 2 (two) times daily with a meal. 10/12/23   Mayers, Cari S, PA-C  cetirizine  (ZYRTEC  ALLERGY) 10 MG tablet Take 1 tablet (10 mg total)  by mouth daily. 10/12/23   Mayers, Cari S, PA-C  Dulaglutide  (TRULICITY ) 1.5 MG/0.5ML SOAJ Inject 1.5 mg into the skin once a week. 10/12/23   Mayers, Cari S, PA-C  glucose blood (TRUE METRIX BLOOD GLUCOSE TEST) test strip Use as instructed 02/15/23   Vernell Goldsmith, MD  lisinopril  (ZESTRIL ) 5 MG tablet Take 1 tablet (5 mg total) by mouth daily. 10/18/23   Mayers, Cari S, PA-C  sodium bicarbonate  650 MG tablet Take 1 tablet (650 mg total) by mouth 2 (two) times daily. 10/12/23   Mayers, Cari S, PA-C  TRUEplus Lancets 28G MISC Use to measure blood sugar twice a day 02/15/23   Vernell Goldsmith, MD      Allergies    Patient has no known allergies.    Review of Systems   Review of Systems  Physical Exam Updated Vital Signs BP 134/71   Pulse 62   Temp 98.4 F (36.9 C)   Resp 18   SpO2 97%  Physical Exam Vitals and nursing note reviewed.  Constitutional:      General: He is not in acute distress.    Appearance: He is well-developed.  HENT:     Head: Normocephalic and atraumatic.  Eyes:     Conjunctiva/sclera: Conjunctivae normal.  Cardiovascular:     Rate and Rhythm: Normal rate and regular rhythm.  Pulmonary:     Effort: Pulmonary effort is normal. No respiratory distress.  Breath sounds: No stridor.  Abdominal:     General: There is no distension.  Musculoskeletal:     Right lower leg: Edema present.     Left lower leg: Edema present.  Skin:    General: Skin is warm and dry.  Neurological:     Mental Status: He is alert and oriented to person, place, and time.     ED Results / Procedures / Treatments   Labs (all labs ordered are listed, but only abnormal results are displayed) Labs Reviewed  BASIC METABOLIC PANEL WITH GFR - Abnormal; Notable for the following components:      Result Value   Chloride 113 (*)    CO2 21 (*)    Glucose, Bld 106 (*)    Creatinine, Ser 1.32 (*)    Calcium  8.1 (*)    Anion gap 4 (*)    All other components within normal limits  CBC  - Abnormal; Notable for the following components:   RBC 2.92 (*)    Hemoglobin 8.6 (*)    HCT 26.6 (*)    All other components within normal limits  BRAIN NATRIURETIC PEPTIDE - Abnormal; Notable for the following components:   B Natriuretic Peptide 383.8 (*)    All other components within normal limits  HEPATIC FUNCTION PANEL - Abnormal; Notable for the following components:   Total Protein 6.1 (*)    Albumin  3.0 (*)    Alkaline Phosphatase 134 (*)    All other components within normal limits  URINALYSIS, ROUTINE W REFLEX MICROSCOPIC - Abnormal; Notable for the following components:   Color, Urine STRAW (*)    Protein, ur 30 (*)    Bacteria, UA RARE (*)    All other components within normal limits    EKG EKG Interpretation Date/Time:  Tuesday Nov 02 2023 09:02:45 EDT Ventricular Rate:  64 PR Interval:  178 QRS Duration:  92 QT Interval:  422 QTC Calculation: 435 R Axis:   25  Text Interpretation: Normal sinus rhythm Normal ECG Confirmed by Dorenda Gandy [phone]) on 11/02/2023 9:22:52 AM  Radiology DG Chest 2 View Result Date: 11/02/2023 CLINICAL DATA:  Shortness of breath. EXAM: CHEST - 2 VIEW COMPARISON:  01/19/2022. FINDINGS: Low lung volumes with accentuation of the cardiopericardial silhouette. Bilateral perihilar interstitial opacities with streaky consolidation at the medial right lung base. No pleural effusion. No pneumothorax. No acute osseous abnormality. IMPRESSION: Low lung volumes. Bilateral perihilar interstitial opacities with streaky consolidation at the medial right lung base, favored to reflect pulmonary vascular congestion with edema, however, atypical infection can not be excluded. Electronically Signed   By: Mannie Seek M.D.   On: 11/02/2023 10:45    Procedures Procedures    Medications Ordered in ED Medications  furosemide (LASIX) injection 40 mg (40 mg Intravenous Given 11/02/23 1250)    ED Course/ Medical Decision Making/ A&P                                  Medical Decision Making Adult male with diabetes, hypertension now presents with substantial lower extremity edema.  Concern for electrolytes versus heart failure, renal dysfunction, hepatic dysfunction, venous stasis. Absence chest pain, dyspnea reassuring, low suspicion for decompensated state. Cardiac 65 sinus normal pulse ox 100% room air normal  Amount and/or Complexity of Data Reviewed Independent Historian:     Details: 2 sons at bedside External Data Reviewed: labs.    Details: Prior labs  reviewed CKD Labs: ordered. Decision-making details documented in ED Course. Radiology: ordered and independent interpretation performed. Decision-making details documented in ED Course. ECG/medicine tests: ordered and independent interpretation performed. Decision-making details documented in ED Course.  Risk Prescription drug management. Decision regarding hospitalization. Diagnosis or treatment significantly limited by social determinants of health.   4:02 PM Patient has had substantial diuresis, states that he is feeling better.  Labs reviewed, discussed, notable for elevated BNP, otherwise generally reassuring.  Reviewing the patient's echocardiogram from 2 years ago notable for hypertrophic features, patient will start daily diuretic, with cardiology consult for follow-up.  No evidence for ACS, pneumonia or other acute phenomenon.        Final Clinical Impression(s) / ED Diagnoses Final diagnoses:  Peripheral edema    Rx / DC Orders ED Discharge Orders          Ordered    Ambulatory referral to Cardiology       Comments: If you have not heard from the Cardiology office within the next 72 hours please call [phone].   11/02/23 1602    furosemide (LASIX) 40 MG tablet  Daily        11/02/23 1602              Dorenda Gandy, MD 11/02/23 [phone]

## 2023-11-02 NOTE — Discharge Instructions (Signed)
 You are starting any medication for edema or swelling.  This may be occurring due to the changes in your heart that were identified on the echocardiogram 2 years ago.  Please take your medication as prescribed and follow-up with her cardiologist.  You should receive a phone call from the cardiology office for scheduling within the next week.  Return here for concerning changes in your condition.

## 2023-11-02 NOTE — ED Triage Notes (Addendum)
 Pt here with reports of bilateral leg swelling onset several weeks ago with gradual worsening. Denies chest pain, states when the swelling first started he had some shob.

## 2023-11-03 ENCOUNTER — Other Ambulatory Visit: Payer: Self-pay

## 2023-11-09 ENCOUNTER — Ambulatory Visit (HOSPITAL_COMMUNITY): Admission: RE | Admit: 2023-11-09 | Payer: Self-pay | Source: Ambulatory Visit

## 2023-11-22 ENCOUNTER — Ambulatory Visit: Payer: Self-pay | Attending: Nurse Practitioner | Admitting: Nurse Practitioner

## 2023-11-22 ENCOUNTER — Encounter: Payer: Self-pay | Admitting: Nurse Practitioner

## 2023-11-22 ENCOUNTER — Other Ambulatory Visit: Payer: Self-pay

## 2023-11-22 VITALS — BP 141/65 | HR 66 | Resp 19 | Ht 62.0 in | Wt 181.0 lb

## 2023-11-22 DIAGNOSIS — E785 Hyperlipidemia, unspecified: Secondary | ICD-10-CM

## 2023-11-22 DIAGNOSIS — I517 Cardiomegaly: Secondary | ICD-10-CM

## 2023-11-22 DIAGNOSIS — E1122 Type 2 diabetes mellitus with diabetic chronic kidney disease: Secondary | ICD-10-CM

## 2023-11-22 DIAGNOSIS — I10 Essential (primary) hypertension: Secondary | ICD-10-CM

## 2023-11-22 DIAGNOSIS — Z7985 Long-term (current) use of injectable non-insulin antidiabetic drugs: Secondary | ICD-10-CM

## 2023-11-22 DIAGNOSIS — D649 Anemia, unspecified: Secondary | ICD-10-CM

## 2023-11-22 DIAGNOSIS — N1832 Chronic kidney disease, stage 3b: Secondary | ICD-10-CM

## 2023-11-22 DIAGNOSIS — I129 Hypertensive chronic kidney disease with stage 1 through stage 4 chronic kidney disease, or unspecified chronic kidney disease: Secondary | ICD-10-CM

## 2023-11-22 MED ORDER — LISINOPRIL 5 MG PO TABS
5.0000 mg | ORAL_TABLET | Freq: Every day | ORAL | 1 refills | Status: DC
  Filled 2023-11-22: qty 90, 90d supply, fill #0
  Filled 2024-05-05: qty 90, 90d supply, fill #1

## 2023-11-22 MED ORDER — FUROSEMIDE 20 MG PO TABS
20.0000 mg | ORAL_TABLET | Freq: Every day | ORAL | 3 refills | Status: AC | PRN
  Filled 2023-11-22: qty 30, 30d supply, fill #0

## 2023-11-22 MED ORDER — TRULICITY 1.5 MG/0.5ML ~~LOC~~ SOAJ
1.5000 mg | SUBCUTANEOUS | 1 refills | Status: DC
  Filled 2023-11-22: qty 2, 28d supply, fill #0
  Filled 2023-11-22: qty 6, 84d supply, fill #0
  Filled 2024-01-14: qty 2, 28d supply, fill #1
  Filled 2024-02-22: qty 2, 28d supply, fill #2
  Filled 2024-05-05: qty 2, 28d supply, fill #3

## 2023-11-22 MED ORDER — FUROSEMIDE 20 MG PO TABS
20.0000 mg | ORAL_TABLET | Freq: Every day | ORAL | 3 refills | Status: DC
Start: 2023-11-22 — End: 2023-11-22
  Filled 2023-11-22: qty 90, 90d supply, fill #0

## 2023-11-22 MED ORDER — FUROSEMIDE 40 MG PO TABS
20.0000 mg | ORAL_TABLET | Freq: Every day | ORAL | 1 refills | Status: DC
Start: 2023-11-22 — End: 2023-11-22
  Filled 2023-11-22: qty 45, 90d supply, fill #0

## 2023-11-22 MED ORDER — ATORVASTATIN CALCIUM 40 MG PO TABS
40.0000 mg | ORAL_TABLET | Freq: Every day | ORAL | 1 refills | Status: DC
  Filled 2023-11-22 – 2024-01-14 (×2): qty 90, 90d supply, fill #0
  Filled 2024-05-05: qty 90, 90d supply, fill #1

## 2023-11-22 MED ORDER — SODIUM BICARBONATE 650 MG PO TABS
650.0000 mg | ORAL_TABLET | Freq: Two times a day (BID) | ORAL | 1 refills | Status: DC
  Filled 2023-11-22: qty 180, 90d supply, fill #0
  Filled 2024-05-05: qty 180, 90d supply, fill #1

## 2023-11-22 NOTE — Progress Notes (Signed)
 Keith Garrett was seen today for medication refill.  Diagnoses and all orders for this visit:  Primary hypertension Bring all medications with you to your next appointment. -     sodium bicarbonate  650 MG tablet; Take 1 tablet (650 mg total) by mouth 2 (two) times daily. -     lisinopril  (ZESTRIL ) 5 MG tablet; Take 1 tablet (5 mg total) by mouth daily.  LVH (left ventricular hypertrophy) -     Ambulatory referral to Cardiology -     furosemide  (LASIX ) 20 MG tablet; Take 1 tablet (20 mg total) by mouth daily as needed. FOR SWELLING -     Brain natriuretic peptide  Anemia, unspecified type -     CBC with Differential  Dyslipidemia -     atorvastatin  (LIPITOR) 40 MG tablet; Take 1 tablet (40 mg total) by mouth daily.  Type 2 diabetes mellitus with stage 3b chronic kidney disease, without long-term current use of insulin  (HCC) -     Dulaglutide  (TRULICITY ) 1.5 MG/0.5ML SOAJ; Inject 1.5 mg into the skin once a week. A1c currently at goal. Lab Results  Component Value Date   HGBA1C 6.2 (A) 10/12/2023        Chief Complaint  Patient presents with   Medication Refill    Keith Garrett 58 y.o. male presents to office today for follow up to HTN He is a previous patient of Dr. Brent Cambric.  He has a past medical history of diabetes and hypertension.  He was seen in the emergency room on Nov 02, 2023 with bilateral lower extremity swelling.  At that time hydrochlorothiazide  was discontinued and he was started on furosemide  20 mg daily.  BNP was elevated at greater than 300.  Previous echocardiogram noted for left ventricular hypertrophy.  He was diuresed with IV furosemide  and it was recommended that he follow-up with cardiology as outpatient.  CBC also noted for significant anemia which we will repeat today.  Today he notes significant improvement in swelling which is all most completely resolved.  We will need to keep an eye on this and make sure that his swelling he also is not  complicated by him taking amlodipine    HTN Blood pressure is elevated today. He does not have any of his medications with him today.  He is currently prescribed amlodipine  10 mg daily, carvedilol  12.5 mg twice daily, lisinopril  5 mg daily.  He is also taking Lasix  20 mg as needed for bilateral lower extremity swelling BP Readings from Last 3 Encounters:  11/22/23 (!) 141/65  11/02/23 (!) 145/73  10/12/23 (!) 153/73     Review of Systems  Constitutional:  Negative for fever, malaise/fatigue and weight loss.  HENT: Negative.  Negative for nosebleeds.   Eyes: Negative.  Negative for blurred vision, double vision and photophobia.  Respiratory: Negative.  Negative for cough and shortness of breath.   Cardiovascular: Negative.  Negative for chest pain, palpitations and leg swelling.  Gastrointestinal: Negative.  Negative for heartburn, nausea and vomiting.  Musculoskeletal: Negative.  Negative for myalgias.  Neurological: Negative.  Negative for dizziness, focal weakness, seizures and headaches.  Psychiatric/Behavioral: Negative.  Negative for suicidal ideas.     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   Traumatic rupture of plantar fascia of left foot 04/14/2020    Past Surgical History:  Procedure Laterality Date   ABDOMINAL AORTOGRAM W/LOWER EXTREMITY N/A 04/17/2020   Procedure: ABDOMINAL AORTOGRAM W/LOWER EXTREMITY;  Surgeon: Young Hensen, MD;  Location: [facility];  Service: Cardiovascular;  Laterality: N/A;   TEE WITHOUT CARDIOVERSION N/A 04/16/2020   Procedure: TRANSESOPHAGEAL ECHOCARDIOGRAM (TEE);  Surgeon: Wendie Hamburg, MD;  Location: [facility] ENDOSCOPY;  Service: Cardiovascular;  Laterality: N/A;    Family History  Problem Relation Age of Onset   Diabetes Mother     Social History Reviewed with no changes to be made today.   Outpatient Medications Prior to Visit  Medication Sig Dispense Refill   allopurinol  (ZYLOPRIM ) 100 MG tablet Take 1.5 tablets (150 mg total) by mouth daily. 45 tablet 1   amLODipine  (NORVASC ) 10 MG tablet Take 1 tablet (10 mg total) by mouth daily. 90 tablet 2   aspirin  EC 81 MG tablet Take 1 tablet (81 mg total) by mouth daily. Swallow whole. 100 tablet 12   carvedilol  (COREG ) 12.5 MG tablet Take 1 tablet (12.5 mg total) by mouth 2 (two) times daily with a meal. 120 tablet 3   Blood Glucose Monitoring Suppl (TRUE METRIX METER) w/Device KIT Use to measure blood sugar twice a day 1 kit 0   Blood Pressure Monitoring (BLOOD PRESSURE KIT) DEVI Use to measure blood pressure 1 each 0   cetirizine  (ZYRTEC  ALLERGY) 10 MG tablet Take 1 tablet (10 mg total) by mouth daily. 30 tablet 11   glucose blood (TRUE METRIX BLOOD GLUCOSE TEST) test strip Use as instructed 100 each 12   TRUEplus Lancets 28G MISC Use to measure blood sugar twice a day 100 each 1   atorvastatin  (LIPITOR) 40 MG tablet Take 1 tablet (40 mg total) by mouth daily. 60 tablet 3   Dulaglutide  (TRULICITY ) 1.5 MG/0.5ML SOAJ Inject 1.5 mg into the skin once a week. 2 mL 0   furosemide  (LASIX ) 40 MG tablet Take 0.5 tablets (20 mg total) by mouth daily. 30 tablet 0   lisinopril  (ZESTRIL ) 5 MG tablet Take 1 tablet (5 mg total) by mouth daily. 30 tablet 1   sodium bicarbonate  650 MG tablet Take 1 tablet (650 mg total) by mouth 2 (two) times daily. 60 tablet 0   No facility-administered medications prior to visit.    No Known Allergies     Objective:    BP (!) 141/65 (BP Location: Left Arm, Patient Position: Sitting, Cuff Size: Normal)   Pulse 66   Resp 19   Ht 5\' 2"  (1.575 m)   Wt 181 lb (82.1 kg)   SpO2 99%   BMI  33.11 kg/m  Wt Readings from Last 3 Encounters:  11/22/23 181 lb (82.1 kg)  10/12/23 211 lb (95.7 kg)  03/11/23 186 lb 12.8 oz (84.7 kg)    Physical Exam Vitals and nursing note reviewed.  Constitutional:      Appearance: He is well-developed.  HENT:     Head: Normocephalic and atraumatic.  Cardiovascular:     Rate and Rhythm: Normal rate and regular rhythm.     Heart sounds: Normal heart sounds. No murmur heard.    No friction rub. No gallop.  Pulmonary:     Effort: Pulmonary effort is normal. No tachypnea or respiratory distress.     Breath sounds: Normal breath sounds. No decreased breath  sounds, wheezing, rhonchi or rales.  Chest:     Chest wall: No tenderness.  Abdominal:     General: Bowel sounds are normal.     Palpations: Abdomen is soft.  Musculoskeletal:        General: Normal range of motion.     Cervical back: Normal range of motion.  Skin:    General: Skin is warm and dry.  Neurological:     Mental Status: He is alert and oriented to person, place, and time.     Coordination: Coordination normal.  Psychiatric:        Behavior: Behavior normal. Behavior is cooperative.        Thought Content: Thought content normal.        Judgment: Judgment normal.          Patient has been counseled extensively about nutrition and exercise as well as the importance of adherence with medications and regular follow-up. The patient was given clear instructions to go to ER or return to medical center if symptoms don't improve, worsen or new problems develop. The patient verbalized understanding.   Follow-up: Return in about 3 months (around 02/22/2024).   Keith Dean, FNP-BC [facility] [place], [place] [phone]   11/22/2023, 2:32 PM

## 2023-11-22 NOTE — Patient Instructions (Signed)
You have been seen today in the Emergency Department (ED)  for dizziness.  Your workup including labs, CT scan of the head and neck, and EKG show reassuring results.  Your symptoms may be due to vertigo, so it is important that you drink plenty of non-alcoholic fluids and take meclizine as needed for dizziness. It is also important to avoid drugs and alcohol.  Please call your regular doctor as soon as possible to schedule the next available clinic appointment to follow up with him/her regarding your visit to the ED and your symptoms. You should ideally follow up with your primary doctor within one week.  I have also given you a referral to cardiology to follow-up with regarding your slow heart rate.  I have given you referral to neurology to follow-up with regarding your dizziness and vertigo.  Return to the Emergency Department (ED)  if you have any syncopal episodes (pass out) or develop ANY severe worsening chest pain, pressure, tightness, palpitations, trouble breathing, sudden sweating, slurred speech, facial droop or other symptoms that concern you.   If you have not heard from the Cardiology office within the next 72 hours please call [phone].

## 2023-11-23 LAB — CBC WITH DIFFERENTIAL/PLATELET
Basophils Absolute: 0.1 10*3/uL (ref 0.0–0.2)
Basos: 1 %
EOS (ABSOLUTE): 1.4 10*3/uL — ABNORMAL HIGH (ref 0.0–0.4)
Eos: 14 %
Hematocrit: 30.6 % — ABNORMAL LOW (ref 37.5–51.0)
Hemoglobin: 9.9 g/dL — ABNORMAL LOW (ref 13.0–17.7)
Immature Grans (Abs): 0 10*3/uL (ref 0.0–0.1)
Immature Granulocytes: 0 %
Lymphocytes Absolute: 1.2 10*3/uL (ref 0.7–3.1)
Lymphs: 12 %
MCH: 29.1 pg (ref 26.6–33.0)
MCHC: 32.4 g/dL (ref 31.5–35.7)
MCV: 90 fL (ref 79–97)
Monocytes Absolute: 0.8 10*3/uL (ref 0.1–0.9)
Monocytes: 8 %
Neutrophils Absolute: 6.4 10*3/uL (ref 1.4–7.0)
Neutrophils: 65 %
Platelets: 265 10*3/uL (ref 150–450)
RBC: 3.4 x10E6/uL — ABNORMAL LOW (ref 4.14–5.80)
RDW: 15.2 % (ref 11.6–15.4)
WBC: 9.9 10*3/uL (ref 3.4–10.8)

## 2023-11-23 LAB — BRAIN NATRIURETIC PEPTIDE: BNP: 178.6 pg/mL — ABNORMAL HIGH (ref 0.0–100.0)

## 2023-11-24 ENCOUNTER — Ambulatory Visit: Payer: Self-pay | Admitting: Nurse Practitioner

## 2023-11-24 DIAGNOSIS — Z1211 Encounter for screening for malignant neoplasm of colon: Secondary | ICD-10-CM

## 2023-12-20 ENCOUNTER — Encounter: Payer: Self-pay | Admitting: Nurse Practitioner

## 2024-01-14 ENCOUNTER — Other Ambulatory Visit: Payer: Self-pay

## 2024-02-22 ENCOUNTER — Ambulatory Visit: Payer: Self-pay | Admitting: Nurse Practitioner

## 2024-02-22 ENCOUNTER — Other Ambulatory Visit: Payer: Self-pay

## 2024-05-05 ENCOUNTER — Other Ambulatory Visit: Payer: Self-pay

## 2024-05-05 DIAGNOSIS — M1A9XX0 Chronic gout, unspecified, without tophus (tophi): Secondary | ICD-10-CM

## 2024-05-05 MED ORDER — ALLOPURINOL 100 MG PO TABS
150.0000 mg | ORAL_TABLET | Freq: Every day | ORAL | 1 refills | Status: AC
  Filled 2024-05-05: qty 45, 30d supply, fill #0
  Filled 2024-07-28: qty 45, 30d supply, fill #1

## 2024-05-22 ENCOUNTER — Encounter: Payer: Self-pay | Admitting: Family Medicine

## 2024-05-22 ENCOUNTER — Other Ambulatory Visit: Payer: Self-pay

## 2024-05-22 ENCOUNTER — Ambulatory Visit: Payer: Self-pay | Attending: Family Medicine | Admitting: Family Medicine

## 2024-05-22 VITALS — BP 138/73 | HR 72 | Temp 98.0°F | Ht 62.0 in | Wt 193.2 lb

## 2024-05-22 DIAGNOSIS — I152 Hypertension secondary to endocrine disorders: Secondary | ICD-10-CM

## 2024-05-22 DIAGNOSIS — I10 Essential (primary) hypertension: Secondary | ICD-10-CM

## 2024-05-22 DIAGNOSIS — E1159 Type 2 diabetes mellitus with other circulatory complications: Secondary | ICD-10-CM

## 2024-05-22 DIAGNOSIS — Z7182 Exercise counseling: Secondary | ICD-10-CM

## 2024-05-22 DIAGNOSIS — Z713 Dietary counseling and surveillance: Secondary | ICD-10-CM

## 2024-05-22 DIAGNOSIS — E785 Hyperlipidemia, unspecified: Secondary | ICD-10-CM

## 2024-05-22 DIAGNOSIS — N1832 Chronic kidney disease, stage 3b: Secondary | ICD-10-CM

## 2024-05-22 DIAGNOSIS — Z1211 Encounter for screening for malignant neoplasm of colon: Secondary | ICD-10-CM

## 2024-05-22 DIAGNOSIS — Z7985 Long-term (current) use of injectable non-insulin antidiabetic drugs: Secondary | ICD-10-CM

## 2024-05-22 LAB — POCT GLYCOSYLATED HEMOGLOBIN (HGB A1C): HbA1c, POC (controlled diabetic range): 7.2 % — AB (ref 0.0–7.0)

## 2024-05-22 MED ORDER — AMLODIPINE BESYLATE 10 MG PO TABS
10.0000 mg | ORAL_TABLET | Freq: Every day | ORAL | 1 refills | Status: AC
  Filled 2024-05-22 – 2024-07-28 (×2): qty 90, 90d supply, fill #0

## 2024-05-22 MED ORDER — TRULICITY 1.5 MG/0.5ML ~~LOC~~ SOAJ
1.5000 mg | SUBCUTANEOUS | 1 refills | Status: AC
  Filled 2024-05-22: qty 6, 84d supply, fill #0
  Filled 2024-06-06: qty 2, 28d supply, fill #0
  Filled 2024-07-28: qty 2, 28d supply, fill #1

## 2024-05-22 MED ORDER — ATORVASTATIN CALCIUM 40 MG PO TABS
40.0000 mg | ORAL_TABLET | Freq: Every day | ORAL | 1 refills | Status: AC
  Filled 2024-05-22 – 2024-07-28 (×2): qty 90, 90d supply, fill #0

## 2024-05-22 NOTE — Progress Notes (Signed)
 Subjective:  Patient ID: Keith Garrett, male    DOB: Apr 11, 1966  Age: 58 y.o. MRN: [id number]  CC: Medical Management of Chronic Issues     Discussed the use of AI scribe software for clinical note transcription with the patient, who gave verbal consent to proceed.  History of Present Illness Keith Garrett is a 58 year old male with type II diabetes melitis, dyslipidemia and hypertension who presents for a follow-up visit.  His diabetes control has worsened, with A1c increasing from 6.2 to 7.2, and he is here to review management.  He endorses adherence with his diabetic regimen however endorses nonadherence to diabetic diet over the last 1 month.  He has hypertension treated with carvedilol  and amlodipine .  He has dyslipidemia treated with atorvastatin  and is due for a repeat cholesterol check, last done in 2024 which revealed LDL at goal but slightly low HDL.  He was started on allopurinol  for gout by the mobile unit. He currently feels well without joint pain.  He cut his finger with a table saw about two weeks ago and completed antibiotics. The wound is healing.  He is due for colon cancer screening and does not yet have a stool specimen kit at home.    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   Traumatic rupture of plantar fascia of left foot 04/14/2020    Past Surgical History:  Procedure Laterality Date   ABDOMINAL AORTOGRAM W/LOWER EXTREMITY N/A 04/17/2020   Procedure: ABDOMINAL AORTOGRAM W/LOWER EXTREMITY;  Surgeon: Gretta Lonni PARAS, MD;  Location: [facility];  Service: Cardiovascular;  Laterality: N/A;   TEE  WITHOUT CARDIOVERSION N/A 04/16/2020   Procedure: TRANSESOPHAGEAL ECHOCARDIOGRAM (TEE);  Surgeon: Kate Lonni CROME, MD;  Location: [facility] ENDOSCOPY;  Service: Cardiovascular;  Laterality: N/A;    Family History  Problem Relation Age of Onset   Diabetes Mother     Social History   Socioeconomic History   Marital status: Married    Spouse name: Not on file   Number of children: Not on file   Years of education: Not on file   Highest education level: Not on file  Occupational History   Not on file  Tobacco Use   Smoking status: Never   Smokeless tobacco: Never  Vaping Use   Vaping status: Never Used  Substance and Sexual Activity   Alcohol use: Not Currently    Comment: occasionally   Drug use: No   Sexual activity: Not Currently  Other Topics Concern   Not on file  Social History Narrative   Not on file   Social Drivers of Health   Financial Resource Strain: Low Risk  (09/14/2022)   Overall Financial Resource Strain (CARDIA)    Difficulty of Paying Living Expenses: Not very hard  Food Insecurity: No Food Insecurity (09/14/2022)   Hunger Vital Sign    Worried About Running Out of Food in the Last Year: Never true    Ran Out of Food in the Last Year: Never true  Transportation Needs: No Transportation Needs (09/14/2022)   PRAPARE - Administrator, Civil Service (Medical): No  Lack of Transportation (Non-Medical): No  Physical Activity: Inactive (09/14/2022)   Exercise Vital Sign    Days of Exercise per Week: 0 days    Minutes of Exercise per Session: 0 min  Stress: No Stress Concern Present (09/14/2022)   [employer] of Occupational Health - Occupational Stress Questionnaire    Feeling of Stress : Only a little  Social Connections: Socially Integrated (09/14/2022)   Social Connection and Isolation Panel    Frequency of Communication with Friends and Family: More than three times a week    Frequency of Social Gatherings with Friends and Family: More  than three times a week    Attends Religious Services: More than 4 times per year    Active Member of [employer] or Organizations: Yes    Attends Engineer, Structural: More than 4 times per year    Marital Status: Married    No Known Allergies  Outpatient Medications Prior to Visit  Medication Sig Dispense Refill   allopurinol  (ZYLOPRIM ) 100 MG tablet Take 1.5 tablets (150 mg total) by mouth daily. 45 tablet 1   aspirin  EC 81 MG tablet Take 1 tablet (81 mg total) by mouth daily. Swallow whole. 100 tablet 12   Blood Glucose Monitoring Suppl (TRUE METRIX METER) w/Device KIT Use to measure blood sugar twice a day 1 kit 0   Blood Pressure Monitoring (BLOOD PRESSURE KIT) DEVI Use to measure blood pressure 1 each 0   carvedilol  (COREG ) 12.5 MG tablet Take 1 tablet (12.5 mg total) by mouth 2 (two) times daily with a meal. 120 tablet 3   cetirizine  (ZYRTEC  ALLERGY) 10 MG tablet Take 1 tablet (10 mg total) by mouth daily. 30 tablet 11   furosemide  (LASIX ) 20 MG tablet Take 1 tablet (20 mg total) by mouth daily as needed. FOR SWELLING 30 tablet 3   glucose blood (TRUE METRIX BLOOD GLUCOSE TEST) test strip Use as instructed 100 each 12   sodium bicarbonate  650 MG tablet Take 1 tablet (650 mg total) by mouth 2 (two) times daily. 180 tablet 1   TRUEplus Lancets 28G MISC Use to measure blood sugar twice a day 100 each 1   amLODipine  (NORVASC ) 10 MG tablet Take 1 tablet (10 mg total) by mouth daily. 90 tablet 2   atorvastatin  (LIPITOR) 40 MG tablet Take 1 tablet (40 mg total) by mouth daily. 90 tablet 1   Dulaglutide  (TRULICITY ) 1.5 MG/0.5ML SOAJ Inject 1.5 mg into the skin once a week. 6 mL 1   lisinopril  (ZESTRIL ) 5 MG tablet Take 1 tablet (5 mg total) by mouth daily. (Patient not taking: Reported on 05/22/2024) 90 tablet 1   No facility-administered medications prior to visit.     ROS Review of Systems  Constitutional:  Negative for activity change and appetite change.  HENT:  Negative for  sinus pressure and sore throat.   Respiratory:  Negative for chest tightness, shortness of breath and wheezing.   Cardiovascular:  Negative for chest pain and palpitations.  Gastrointestinal:  Negative for abdominal distention, abdominal pain and constipation.  Genitourinary: Negative.   Musculoskeletal: Negative.   Skin:  Positive for wound.  Psychiatric/Behavioral:  Negative for behavioral problems and dysphoric mood.     Objective:  BP 138/73   Pulse 72   Temp 98 F (36.7 C) (Oral)   Ht 5' 2 (1.575 m)   Wt 193 lb 3.2 oz (87.6 kg)   SpO2 98%   BMI 35.34 kg/m      05/22/2024  2:13 PM 11/22/2023    2:26 PM 11/22/2023    1:50 PM  BP/Weight  Systolic BP 138 141 150  Diastolic BP 73 65 68  Wt. (Lbs) 193.2  181  BMI 35.34 kg/m2  33.11 kg/m2      Physical Exam Constitutional:      Appearance: He is well-developed.  Cardiovascular:     Rate and Rhythm: Normal rate.     Heart sounds: Normal heart sounds. No murmur heard. Pulmonary:     Effort: Pulmonary effort is normal.     Breath sounds: Normal breath sounds. No wheezing or rales.  Chest:     Chest wall: No tenderness.  Abdominal:     General: Bowel sounds are normal. There is no distension.     Palpations: Abdomen is soft. There is no mass.     Tenderness: There is no abdominal tenderness.  Musculoskeletal:        General: Normal range of motion.     Right lower leg: No edema.     Left lower leg: No edema.  Skin:    Comments: Laceration on right second finger which is healing.  Neurological:     Mental Status: He is alert and oriented to person, place, and time.  Psychiatric:        Mood and Affect: Mood normal.        Latest Ref Rng & Units 11/02/2023   10:58 AM 11/02/2023    9:03 AM 10/12/2023    4:39 PM  CMP  Glucose 70 - 99 mg/dL  893  895   BUN 6 - 20 mg/dL  15  29   Creatinine 9.38 - 1.24 mg/dL  8.67  8.15   Sodium 864 - 145 mmol/L  138  140   Potassium 3.5 - 5.1 mmol/L  3.8  3.9   Chloride 98 -  111 mmol/L  113  103   CO2 22 - 32 mmol/L  21    Calcium  8.9 - 10.3 mg/dL  8.1  8.5   Total Protein 6.5 - 8.1 g/dL 6.1   7.2   Total Bilirubin 0.0 - 1.2 mg/dL 0.5   0.3   Alkaline Phos 38 - 126 U/L 134   172   AST 15 - 41 U/L 26   19   ALT 0 - 44 U/L 19       Lipid Panel     Component Value Date/Time   CHOL 104 10/07/2022 0921   TRIG 115 10/07/2022 0921   HDL 36 (L) 10/07/2022 0921   CHOLHDL 2.9 10/07/2022 0921   CHOLHDL 6.1 04/16/2020 0049   VLDL 25 04/16/2020 0049   LDLCALC 47 10/07/2022 0921    CBC    Component Value Date/Time   WBC 9.9 11/22/2023 1430   WBC 7.2 11/02/2023 0903   RBC 3.40 (L) 11/22/2023 1430   RBC 2.92 (L) 11/02/2023 0903   HGB 9.9 (L) 11/22/2023 1430   HCT 30.6 (L) 11/22/2023 1430   PLT 265 11/22/2023 1430   MCV 90 11/22/2023 1430   MCH 29.1 11/22/2023 1430   MCH 29.5 11/02/2023 0903   MCHC 32.4 11/22/2023 1430   MCHC 32.3 11/02/2023 0903   RDW 15.2 11/22/2023 1430   LYMPHSABS 1.2 11/22/2023 1430   MONOABS 0.4 01/16/2022 1713   EOSABS 1.4 (H) 11/22/2023 1430   BASOSABS 0.1 11/22/2023 1430    Lab Results  Component Value Date   HGBA1C 7.2 (A) 05/22/2024       Assessment &  Plan Type 2 diabetes mellitus A1c increased from 6.2% to 7.2%, indicating suboptimal control; goal is less than 7.0 - Advised dietary modifications and increased physical activity. -No regimen change as he promises to work on his lifestyle - Scheduled follow-up in six months.  Hypertension associated with type 2 diabetes mellitus Well-controlled with current medication regimen. -Counseled on blood pressure goal of less than 130/80, low-sodium, DASH diet, medication compliance, 150 minutes of moderate intensity exercise per week. Discussed medication compliance, adverse effects.   Associated with type 2 diabetes mellitus Managed with atorvastatin . Last cholesterol check was a year ago. - Ordered fasting lipid panel for tomorrow.  General Health Maintenance Due  for colon cancer screening. Screening for colon cancer- Ordered colon cancer screening kit.      Meds ordered this encounter  Medications   amLODipine  (NORVASC ) 10 MG tablet    Sig: Take 1 tablet (10 mg total) by mouth daily.    Dispense:  90 tablet    Refill:  1   atorvastatin  (LIPITOR) 40 MG tablet    Sig: Take 1 tablet (40 mg total) by mouth daily.    Dispense:  90 tablet    Refill:  1   Dulaglutide  (TRULICITY ) 1.5 MG/0.5ML SOAJ    Sig: Inject 1.5 mg into the skin once a week.    Dispense:  6 mL    Refill:  1    Follow-up: Return in about 6 months (around 11/20/2024) for Chronic medical conditions.       Corrina Sabin, MD, FAAFP. [facility] [place], [place] [phone]   05/22/2024, 2:33 PM

## 2024-05-22 NOTE — Patient Instructions (Signed)
 VISIT SUMMARY:  Keith Garrett, you came in today for a follow-up visit to review your diabetes and other health conditions. Your diabetes control has worsened slightly, and we discussed ways to improve it. We also reviewed your hypertension, cholesterol, and general health maintenance needs.  YOUR PLAN:  -TYPE 2 DIABETES MELLITUS: Your A1c level has increased from 6.2% to 7.2%, which means your blood sugar control has worsened. We discussed making dietary changes and increasing physical activity to help manage your diabetes better. We will follow up in six months to check your progress.  -CHRONIC KIDNEY DISEASE: Previous tests showed slightly abnormal kidney function. We have ordered repeat kidney function tests to monitor your condition.  -ESSENTIAL HYPERTENSION: Your high blood pressure is well-controlled with your current medication, carvedilol . No changes are needed at this time.  -HYPERLIPIDEMIA: Your high cholesterol is being managed with atorvastatin . We have ordered a fasting lipid panel for tomorrow to check your cholesterol levels.  -GENERAL HEALTH MAINTENANCE: You are due for colon cancer screening. We have ordered a colon cancer screening kit for you to use at home.  INSTRUCTIONS:  Please complete the fasting lipid panel tomorrow and use the colon cancer screening kit as instructed. We will follow up in six months to review your diabetes management and kidney function tests.

## 2024-05-23 ENCOUNTER — Ambulatory Visit: Payer: Self-pay | Attending: Critical Care Medicine

## 2024-05-23 DIAGNOSIS — E1122 Type 2 diabetes mellitus with diabetic chronic kidney disease: Secondary | ICD-10-CM

## 2024-05-24 ENCOUNTER — Ambulatory Visit: Payer: Self-pay | Admitting: Family Medicine

## 2024-05-24 LAB — CMP14+EGFR
ALT: 28 IU/L (ref 0–44)
AST: 23 IU/L (ref 0–40)
Albumin: 3.4 g/dL — ABNORMAL LOW (ref 3.8–4.9)
Alkaline Phosphatase: 151 IU/L — ABNORMAL HIGH (ref 47–123)
BUN/Creatinine Ratio: 20 (ref 9–20)
BUN: 30 mg/dL — ABNORMAL HIGH (ref 6–24)
Bilirubin Total: 0.4 mg/dL (ref 0.0–1.2)
CO2: 21 mmol/L (ref 20–29)
Calcium: 8.6 mg/dL — ABNORMAL LOW (ref 8.7–10.2)
Chloride: 105 mmol/L (ref 96–106)
Creatinine, Ser: 1.49 mg/dL — ABNORMAL HIGH (ref 0.76–1.27)
Globulin, Total: 2.9 g/dL (ref 1.5–4.5)
Glucose: 133 mg/dL — ABNORMAL HIGH (ref 70–99)
Potassium: 4.9 mmol/L (ref 3.5–5.2)
Sodium: 137 mmol/L (ref 134–144)
Total Protein: 6.3 g/dL (ref 6.0–8.5)
eGFR: 54 mL/min/1.73 — ABNORMAL LOW (ref >59)

## 2024-05-24 LAB — LP+NON-HDL CHOLESTEROL
Cholesterol, Total: 110 mg/dL (ref 100–199)
HDL: 49 mg/dL (ref >39)
LDL Chol Calc (NIH): 47 mg/dL (ref 0–99)
Total Non-HDL-Chol (LDL+VLDL): 61 mg/dL (ref 0–129)
Triglycerides: 62 mg/dL (ref 0–149)
VLDL Cholesterol Cal: 14 mg/dL (ref 5–40)

## 2024-06-06 ENCOUNTER — Other Ambulatory Visit: Payer: Self-pay

## 2024-07-28 ENCOUNTER — Other Ambulatory Visit: Payer: Self-pay | Admitting: Nurse Practitioner

## 2024-07-28 ENCOUNTER — Other Ambulatory Visit: Payer: Self-pay

## 2024-07-28 DIAGNOSIS — I10 Essential (primary) hypertension: Secondary | ICD-10-CM

## 2024-07-28 MED ORDER — SODIUM BICARBONATE 650 MG PO TABS
650.0000 mg | ORAL_TABLET | Freq: Two times a day (BID) | ORAL | 1 refills | Status: AC
  Filled 2024-07-28: qty 180, 90d supply, fill #0

## 2024-11-20 ENCOUNTER — Ambulatory Visit: Payer: Self-pay | Admitting: Family Medicine
